# Patient Record
Sex: Male | Born: 1945 | Race: White | Hispanic: No | Marital: Married | Smoking: Light tobacco smoker
Health system: Southern US, Community
[De-identification: ages and names within clinical notes are randomized; demographics above are authoritative.]

## PROBLEM LIST (undated history)

## (undated) DIAGNOSIS — H9190 Unspecified hearing loss, unspecified ear: Secondary | ICD-10-CM

## (undated) DIAGNOSIS — K649 Unspecified hemorrhoids: Secondary | ICD-10-CM

## (undated) DIAGNOSIS — R972 Elevated prostate specific antigen [PSA]: Secondary | ICD-10-CM

## (undated) DIAGNOSIS — F528 Other sexual dysfunction not due to a substance or known physiological condition: Secondary | ICD-10-CM

## (undated) DIAGNOSIS — I2510 Atherosclerotic heart disease of native coronary artery without angina pectoris: Secondary | ICD-10-CM

## (undated) DIAGNOSIS — K5730 Diverticulosis of large intestine without perforation or abscess without bleeding: Secondary | ICD-10-CM

## (undated) DIAGNOSIS — IMO0002 Reserved for concepts with insufficient information to code with codable children: Secondary | ICD-10-CM

## (undated) DIAGNOSIS — J381 Polyp of vocal cord and larynx: Secondary | ICD-10-CM

## (undated) DIAGNOSIS — E785 Hyperlipidemia, unspecified: Secondary | ICD-10-CM

## (undated) DIAGNOSIS — K219 Gastro-esophageal reflux disease without esophagitis: Secondary | ICD-10-CM

## (undated) DIAGNOSIS — M1990 Unspecified osteoarthritis, unspecified site: Secondary | ICD-10-CM

## (undated) DIAGNOSIS — C61 Malignant neoplasm of prostate: Secondary | ICD-10-CM

## (undated) HISTORY — DX: Hyperlipidemia, unspecified: E78.5

## (undated) HISTORY — PX: OTHER SURGICAL HISTORY: SHX169

## (undated) HISTORY — DX: Malignant neoplasm of prostate: C61

## (undated) HISTORY — DX: Unspecified hearing loss, unspecified ear: H91.90

## (undated) HISTORY — DX: Unspecified osteoarthritis, unspecified site: M19.90

## (undated) HISTORY — DX: Elevated prostate specific antigen (PSA): R97.20

## (undated) HISTORY — DX: Diverticulosis of large intestine without perforation or abscess without bleeding: K57.30

## (undated) HISTORY — PX: TONSILLECTOMY: SUR1361

## (undated) HISTORY — DX: Other sexual dysfunction not due to a substance or known physiological condition: F52.8

## (undated) HISTORY — DX: Gastro-esophageal reflux disease without esophagitis: K21.9

## (undated) HISTORY — DX: Unspecified hemorrhoids: K64.9

## (undated) HISTORY — DX: Polyp of vocal cord and larynx: J38.1

## (undated) HISTORY — DX: Atherosclerotic heart disease of native coronary artery without angina pectoris: I25.10

## (undated) HISTORY — DX: Reserved for concepts with insufficient information to code with codable children: IMO0002

## (undated) HISTORY — PX: HEMORROIDECTOMY: SUR656

---

## 1999-05-20 HISTORY — PX: CARDIAC CATHETERIZATION: SHX172

## 1999-06-13 ENCOUNTER — Observation Stay (HOSPITAL_COMMUNITY): Admission: AD | Admit: 1999-06-13 | Discharge: 1999-06-14 | Payer: Self-pay | Admitting: *Deleted

## 1999-07-01 ENCOUNTER — Encounter (HOSPITAL_COMMUNITY): Admission: RE | Admit: 1999-07-01 | Discharge: 1999-09-29 | Payer: Self-pay | Admitting: Cardiology

## 1999-09-30 ENCOUNTER — Encounter (HOSPITAL_COMMUNITY): Admission: RE | Admit: 1999-09-30 | Discharge: 1999-10-19 | Payer: Self-pay | Admitting: Cardiology

## 1999-10-20 ENCOUNTER — Encounter: Admission: RE | Admit: 1999-10-20 | Discharge: 2000-01-18 | Payer: Self-pay | Admitting: Cardiology

## 1999-10-31 ENCOUNTER — Ambulatory Visit (HOSPITAL_COMMUNITY): Admission: RE | Admit: 1999-10-31 | Discharge: 1999-10-31 | Payer: Self-pay | Admitting: Gastroenterology

## 2000-10-07 ENCOUNTER — Emergency Department (HOSPITAL_COMMUNITY): Admission: EM | Admit: 2000-10-07 | Discharge: 2000-10-07 | Payer: Self-pay | Admitting: *Deleted

## 2002-09-05 ENCOUNTER — Emergency Department (HOSPITAL_COMMUNITY): Admission: EM | Admit: 2002-09-05 | Discharge: 2002-09-05 | Payer: Self-pay | Admitting: Emergency Medicine

## 2002-09-05 ENCOUNTER — Encounter: Payer: Self-pay | Admitting: Emergency Medicine

## 2003-05-21 ENCOUNTER — Encounter: Admission: RE | Admit: 2003-05-21 | Discharge: 2003-05-21 | Payer: Self-pay | Admitting: Otolaryngology

## 2003-05-21 ENCOUNTER — Encounter: Payer: Self-pay | Admitting: Otolaryngology

## 2003-05-22 ENCOUNTER — Ambulatory Visit (HOSPITAL_BASED_OUTPATIENT_CLINIC_OR_DEPARTMENT_OTHER): Admission: RE | Admit: 2003-05-22 | Discharge: 2003-05-22 | Payer: Self-pay | Admitting: Otolaryngology

## 2003-05-22 ENCOUNTER — Encounter (INDEPENDENT_AMBULATORY_CARE_PROVIDER_SITE_OTHER): Payer: Self-pay | Admitting: *Deleted

## 2004-08-25 ENCOUNTER — Ambulatory Visit: Payer: Self-pay | Admitting: Family Medicine

## 2004-10-15 ENCOUNTER — Ambulatory Visit: Payer: Self-pay | Admitting: Internal Medicine

## 2005-04-13 ENCOUNTER — Ambulatory Visit: Payer: Self-pay | Admitting: Cardiology

## 2005-04-13 ENCOUNTER — Ambulatory Visit: Payer: Self-pay

## 2005-04-17 ENCOUNTER — Ambulatory Visit: Payer: Self-pay | Admitting: Cardiology

## 2005-06-17 ENCOUNTER — Ambulatory Visit: Payer: Self-pay | Admitting: Cardiology

## 2005-09-09 ENCOUNTER — Ambulatory Visit: Payer: Self-pay | Admitting: Internal Medicine

## 2005-09-24 ENCOUNTER — Ambulatory Visit: Payer: Self-pay

## 2005-10-26 ENCOUNTER — Ambulatory Visit: Payer: Self-pay | Admitting: Internal Medicine

## 2006-04-19 ENCOUNTER — Ambulatory Visit: Payer: Self-pay | Admitting: Cardiology

## 2006-04-19 ENCOUNTER — Ambulatory Visit: Payer: Self-pay

## 2006-04-22 ENCOUNTER — Ambulatory Visit: Payer: Self-pay | Admitting: Cardiology

## 2006-05-13 ENCOUNTER — Ambulatory Visit: Payer: Self-pay

## 2006-08-04 ENCOUNTER — Ambulatory Visit: Payer: Self-pay | Admitting: Internal Medicine

## 2006-08-06 ENCOUNTER — Ambulatory Visit: Payer: Self-pay | Admitting: Internal Medicine

## 2006-08-06 LAB — CONVERTED CEMR LAB
ALT: 25 units/L (ref 0–40)
AST: 28 units/L (ref 0–37)
Albumin: 4 g/dL (ref 3.5–5.2)
Alkaline Phosphatase: 80 units/L (ref 39–117)
BUN: 18 mg/dL (ref 6–23)
Bilirubin, Direct: 0.2 mg/dL (ref 0.0–0.3)
CO2: 29 meq/L (ref 19–32)
Calcium: 9.3 mg/dL (ref 8.4–10.5)
Chloride: 108 meq/L (ref 96–112)
Creatinine, Ser: 1.1 mg/dL (ref 0.4–1.5)
GFR calc non Af Amer: 73 mL/min
Glomerular Filtration Rate, Af Am: 88 mL/min/{1.73_m2}
Glucose, Bld: 113 mg/dL — ABNORMAL HIGH (ref 70–99)
HCT: 44.2 % (ref 39.0–52.0)
Hemoglobin: 14.7 g/dL (ref 13.0–17.0)
Hgb A1c MFr Bld: 6.4 % — ABNORMAL HIGH (ref 4.6–6.0)
MCHC: 33.4 g/dL (ref 30.0–36.0)
MCV: 94.1 fL (ref 78.0–100.0)
PSA: 0.49 ng/mL (ref 0.10–4.00)
Platelets: 219 10*3/uL (ref 150–400)
Potassium: 4.4 meq/L (ref 3.5–5.1)
RBC: 4.69 M/uL (ref 4.22–5.81)
RDW: 12.6 % (ref 11.5–14.6)
Sodium: 143 meq/L (ref 135–145)
Total Bilirubin: 1.1 mg/dL (ref 0.3–1.2)
Total Protein: 6.7 g/dL (ref 6.0–8.3)
WBC: 5.8 10*3/uL (ref 4.5–10.5)

## 2006-08-26 ENCOUNTER — Ambulatory Visit: Payer: Self-pay | Admitting: Internal Medicine

## 2006-08-26 LAB — CONVERTED CEMR LAB
Chol/HDL Ratio, serum: 3
Cholesterol: 117 mg/dL (ref 0–200)
HDL: 39 mg/dL (ref >39.0)
LDL Cholesterol: 63 mg/dL (ref 0–99)
Triglyceride fasting, serum: 74 mg/dL (ref 0–149)
VLDL: 15 mg/dL (ref 0–40)

## 2006-10-21 ENCOUNTER — Encounter: Admission: RE | Admit: 2006-10-21 | Discharge: 2006-11-15 | Payer: Self-pay | Admitting: Internal Medicine

## 2006-12-30 ENCOUNTER — Ambulatory Visit: Payer: Self-pay | Admitting: Internal Medicine

## 2006-12-30 LAB — CONVERTED CEMR LAB
Cholesterol: 87 mg/dL (ref 0–200)
HDL: 34.7 mg/dL — ABNORMAL LOW (ref >39.0)
Hgb A1c MFr Bld: 6 % (ref 4.6–6.0)
LDL Cholesterol: 41 mg/dL (ref 0–99)
Total CHOL/HDL Ratio: 2.5
Triglycerides: 56 mg/dL (ref 0–149)
VLDL: 11 mg/dL (ref 0–40)

## 2007-03-31 ENCOUNTER — Ambulatory Visit: Payer: Self-pay

## 2007-03-31 ENCOUNTER — Encounter: Payer: Self-pay | Admitting: Internal Medicine

## 2007-03-31 LAB — CONVERTED CEMR LAB
ALT: 22 units/L (ref 0–40)
AST: 28 units/L (ref 0–37)
Albumin: 3.7 g/dL (ref 3.5–5.2)
Alkaline Phosphatase: 74 units/L (ref 39–117)
Bilirubin, Direct: 0.1 mg/dL (ref 0.0–0.3)
Cholesterol: 107 mg/dL (ref 0–200)
HDL: 33.9 mg/dL — ABNORMAL LOW (ref >39.0)
Hgb A1c MFr Bld: 6.1 % — ABNORMAL HIGH (ref 4.6–6.0)
LDL Cholesterol: 54 mg/dL (ref 0–99)
Total Bilirubin: 1.1 mg/dL (ref 0.3–1.2)
Total CHOL/HDL Ratio: 3.2
Total Protein: 6.5 g/dL (ref 6.0–8.3)
Triglycerides: 97 mg/dL (ref 0–149)
VLDL: 19 mg/dL (ref 0–40)

## 2007-04-12 ENCOUNTER — Ambulatory Visit: Payer: Self-pay | Admitting: Internal Medicine

## 2007-04-12 DIAGNOSIS — I2510 Atherosclerotic heart disease of native coronary artery without angina pectoris: Secondary | ICD-10-CM

## 2007-04-12 DIAGNOSIS — E119 Type 2 diabetes mellitus without complications: Secondary | ICD-10-CM

## 2007-04-12 DIAGNOSIS — E785 Hyperlipidemia, unspecified: Secondary | ICD-10-CM | POA: Insufficient documentation

## 2007-04-15 ENCOUNTER — Ambulatory Visit: Payer: Self-pay | Admitting: Cardiology

## 2007-04-15 LAB — CONVERTED CEMR LAB
BUN: 17 mg/dL (ref 6–23)
CO2: 32 meq/L (ref 19–32)
Calcium: 9.4 mg/dL (ref 8.4–10.5)
Chloride: 109 meq/L (ref 96–112)
Creatinine, Ser: 1.1 mg/dL (ref 0.4–1.5)
GFR calc Af Amer: 88 mL/min
GFR calc non Af Amer: 73 mL/min
Glucose, Bld: 91 mg/dL (ref 70–99)
Potassium: 4.3 meq/L (ref 3.5–5.1)
Sodium: 143 meq/L (ref 135–145)

## 2007-04-26 ENCOUNTER — Encounter: Payer: Self-pay | Admitting: Internal Medicine

## 2007-08-06 ENCOUNTER — Encounter: Payer: Self-pay | Admitting: Internal Medicine

## 2007-08-26 ENCOUNTER — Ambulatory Visit: Payer: Self-pay | Admitting: Internal Medicine

## 2007-08-26 DIAGNOSIS — K5730 Diverticulosis of large intestine without perforation or abscess without bleeding: Secondary | ICD-10-CM | POA: Insufficient documentation

## 2007-08-26 DIAGNOSIS — Z87898 Personal history of other specified conditions: Secondary | ICD-10-CM | POA: Insufficient documentation

## 2007-08-29 LAB — CONVERTED CEMR LAB
Bilirubin, Direct: 0.1 mg/dL (ref 0.0–0.3)
Creatinine,U: 173.6 mg/dL
Eosinophils Absolute: 0.1 10*3/uL (ref 0.0–0.6)
Eosinophils Relative: 2.4 % (ref 0.0–5.0)
HCT: 43.1 % (ref 39.0–52.0)
Hemoglobin: 14.8 g/dL (ref 13.0–17.0)
Lymphocytes Relative: 32.6 % (ref 12.0–46.0)
MCV: 94.1 fL (ref 78.0–100.0)
Microalb Creat Ratio: 4.6 mg/g (ref 0.0–30.0)
Microalb, Ur: 0.8 mg/dL (ref 0.0–1.9)
Monocytes Absolute: 0.5 10*3/uL (ref 0.2–0.7)
Neutro Abs: 3.4 10*3/uL (ref 1.4–7.7)
Neutrophils Relative %: 55.2 % (ref 43.0–77.0)
Platelets: 212 10*3/uL (ref 150–400)
Total Protein: 7 g/dL (ref 6.0–8.3)
WBC: 6.1 10*3/uL (ref 4.5–10.5)

## 2008-02-27 ENCOUNTER — Telehealth (INDEPENDENT_AMBULATORY_CARE_PROVIDER_SITE_OTHER): Payer: Self-pay | Admitting: *Deleted

## 2008-03-20 ENCOUNTER — Ambulatory Visit: Payer: Self-pay | Admitting: Internal Medicine

## 2008-03-20 DIAGNOSIS — F528 Other sexual dysfunction not due to a substance or known physiological condition: Secondary | ICD-10-CM

## 2008-03-23 LAB — CONVERTED CEMR LAB
BUN: 18 mg/dL (ref 6–23)
Calcium: 8.8 mg/dL (ref 8.4–10.5)
GFR calc Af Amer: 110 mL/min
Glucose, Bld: 88 mg/dL (ref 70–99)

## 2008-04-17 ENCOUNTER — Ambulatory Visit: Payer: Self-pay | Admitting: Cardiology

## 2008-04-17 LAB — CONVERTED CEMR LAB
AST: 30 units/L (ref 0–37)
Alkaline Phosphatase: 72 units/L (ref 39–117)
Bilirubin, Direct: 0.1 mg/dL (ref 0.0–0.3)
Chloride: 103 meq/L (ref 96–112)
Cholesterol: 143 mg/dL (ref 0–200)
GFR calc Af Amer: 98 mL/min
GFR calc non Af Amer: 81 mL/min
Glucose, Bld: 98 mg/dL (ref 70–99)
HDL: 34.8 mg/dL — ABNORMAL LOW (ref >39.0)
LDL Cholesterol: 76 mg/dL (ref 0–99)
Potassium: 4.3 meq/L (ref 3.5–5.1)
Sodium: 140 meq/L (ref 135–145)
Total CHOL/HDL Ratio: 4.1
Total Protein: 6.8 g/dL (ref 6.0–8.3)
Triglycerides: 162 mg/dL — ABNORMAL HIGH (ref 0–149)

## 2008-05-09 ENCOUNTER — Telehealth: Payer: Self-pay | Admitting: Internal Medicine

## 2008-05-30 ENCOUNTER — Ambulatory Visit: Payer: Self-pay | Admitting: Internal Medicine

## 2008-06-04 ENCOUNTER — Telehealth (INDEPENDENT_AMBULATORY_CARE_PROVIDER_SITE_OTHER): Payer: Self-pay | Admitting: *Deleted

## 2008-06-29 ENCOUNTER — Telehealth: Payer: Self-pay | Admitting: Internal Medicine

## 2008-06-29 ENCOUNTER — Ambulatory Visit: Payer: Self-pay | Admitting: Internal Medicine

## 2008-08-22 ENCOUNTER — Ambulatory Visit: Payer: Self-pay | Admitting: Internal Medicine

## 2008-08-24 LAB — CONVERTED CEMR LAB
BUN: 18 mg/dL (ref 6–23)
CO2: 30 meq/L (ref 19–32)
Chloride: 108 meq/L (ref 96–112)
Eosinophils Relative: 2.9 % (ref 0.0–5.0)
Glucose, Bld: 94 mg/dL (ref 70–99)
HDL: 32.6 mg/dL — ABNORMAL LOW (ref >39.0)
Hgb A1c MFr Bld: 6.4 % — ABNORMAL HIGH (ref 4.6–6.0)
Microalb Creat Ratio: 1.7 mg/g (ref 0.0–30.0)
Monocytes Relative: 9.2 % (ref 3.0–12.0)
Neutrophils Relative %: 61.9 % (ref 43.0–77.0)
Platelets: 182 10*3/uL (ref 150–400)
Potassium: 4.2 meq/L (ref 3.5–5.1)
TSH: 1.84 microintl units/mL (ref 0.35–5.50)
Total CHOL/HDL Ratio: 3.8
VLDL: 23 mg/dL (ref 0–40)
WBC: 6.5 10*3/uL (ref 4.5–10.5)

## 2008-08-28 ENCOUNTER — Ambulatory Visit: Payer: Self-pay | Admitting: Internal Medicine

## 2008-09-05 ENCOUNTER — Ambulatory Visit: Payer: Self-pay | Admitting: Internal Medicine

## 2008-09-05 LAB — CONVERTED CEMR LAB: OCCULT 1: NEGATIVE

## 2008-09-06 ENCOUNTER — Encounter (INDEPENDENT_AMBULATORY_CARE_PROVIDER_SITE_OTHER): Payer: Self-pay | Admitting: *Deleted

## 2008-10-19 HISTORY — PX: MOUTH SURGERY: SHX715

## 2008-11-29 ENCOUNTER — Ambulatory Visit: Payer: Self-pay | Admitting: Internal Medicine

## 2008-12-03 ENCOUNTER — Telehealth (INDEPENDENT_AMBULATORY_CARE_PROVIDER_SITE_OTHER): Payer: Self-pay | Admitting: *Deleted

## 2008-12-03 LAB — CONVERTED CEMR LAB
ALT: 26 units/L (ref 0–53)
AST: 33 units/L (ref 0–37)
LDL Cholesterol: 68 mg/dL (ref 0–99)
Total CHOL/HDL Ratio: 3.9
Triglycerides: 142 mg/dL (ref 0–149)

## 2009-03-04 ENCOUNTER — Encounter (INDEPENDENT_AMBULATORY_CARE_PROVIDER_SITE_OTHER): Payer: Self-pay | Admitting: *Deleted

## 2009-04-08 ENCOUNTER — Ambulatory Visit: Payer: Self-pay | Admitting: Internal Medicine

## 2009-04-11 ENCOUNTER — Telehealth (INDEPENDENT_AMBULATORY_CARE_PROVIDER_SITE_OTHER): Payer: Self-pay | Admitting: *Deleted

## 2009-04-11 LAB — CONVERTED CEMR LAB
Hgb A1c MFr Bld: 6 % (ref 4.6–6.5)
VLDL: 11.4 mg/dL (ref 0.0–40.0)

## 2009-05-01 ENCOUNTER — Ambulatory Visit: Payer: Self-pay | Admitting: Cardiology

## 2009-05-01 ENCOUNTER — Encounter: Payer: Self-pay | Admitting: Cardiology

## 2009-07-18 ENCOUNTER — Encounter (INDEPENDENT_AMBULATORY_CARE_PROVIDER_SITE_OTHER): Payer: Self-pay | Admitting: *Deleted

## 2009-08-07 ENCOUNTER — Ambulatory Visit: Payer: Self-pay | Admitting: Internal Medicine

## 2009-08-12 LAB — CONVERTED CEMR LAB
ALT: 18 units/L (ref 0–53)
BUN: 14 mg/dL (ref 6–23)
Calcium: 9.6 mg/dL (ref 8.4–10.5)
Creatinine, Ser: 1 mg/dL (ref 0.4–1.5)
GFR calc non Af Amer: 80.32 mL/min (ref >60)
Hgb A1c MFr Bld: 5.8 % (ref 4.6–6.5)
Microalb Creat Ratio: 3.5 mg/g (ref 0.0–30.0)
Potassium: 4.4 meq/L (ref 3.5–5.1)

## 2009-11-07 ENCOUNTER — Ambulatory Visit: Payer: Self-pay | Admitting: Internal Medicine

## 2009-11-07 DIAGNOSIS — K219 Gastro-esophageal reflux disease without esophagitis: Secondary | ICD-10-CM

## 2009-11-10 LAB — CONVERTED CEMR LAB
Basophils Absolute: 0.1 10*3/uL (ref 0.0–0.1)
Eosinophils Absolute: 0.2 10*3/uL (ref 0.0–0.7)
HCT: 45.8 % (ref 39.0–52.0)
Hemoglobin: 15.2 g/dL (ref 13.0–17.0)
Lymphs Abs: 2 10*3/uL (ref 0.7–4.0)
MCHC: 33.1 g/dL (ref 30.0–36.0)
MCV: 96.6 fL (ref 78.0–100.0)
Monocytes Absolute: 0.5 10*3/uL (ref 0.1–1.0)
Neutro Abs: 3 10*3/uL (ref 1.4–7.7)
PSA: 1.09 ng/mL (ref 0.10–4.00)
RDW: 12.9 % (ref 11.5–14.6)
Total CHOL/HDL Ratio: 3

## 2009-11-29 ENCOUNTER — Encounter (INDEPENDENT_AMBULATORY_CARE_PROVIDER_SITE_OTHER): Payer: Self-pay | Admitting: *Deleted

## 2009-12-02 ENCOUNTER — Encounter (INDEPENDENT_AMBULATORY_CARE_PROVIDER_SITE_OTHER): Payer: Self-pay

## 2009-12-03 ENCOUNTER — Ambulatory Visit: Payer: Self-pay | Admitting: Gastroenterology

## 2009-12-13 ENCOUNTER — Ambulatory Visit: Payer: Self-pay | Admitting: Gastroenterology

## 2009-12-16 ENCOUNTER — Telehealth (INDEPENDENT_AMBULATORY_CARE_PROVIDER_SITE_OTHER): Payer: Self-pay | Admitting: *Deleted

## 2009-12-17 ENCOUNTER — Encounter: Payer: Self-pay | Admitting: Gastroenterology

## 2010-01-09 ENCOUNTER — Telehealth: Payer: Self-pay | Admitting: Cardiology

## 2010-01-09 ENCOUNTER — Telehealth (INDEPENDENT_AMBULATORY_CARE_PROVIDER_SITE_OTHER): Payer: Self-pay | Admitting: *Deleted

## 2010-02-12 ENCOUNTER — Ambulatory Visit: Payer: Self-pay | Admitting: Internal Medicine

## 2010-02-18 LAB — CONVERTED CEMR LAB
ALT: 22 units/L (ref 0–53)
AST: 30 units/L (ref 0–37)
CO2: 33 meq/L — ABNORMAL HIGH (ref 19–32)
Chloride: 102 meq/L (ref 96–112)
Hgb A1c MFr Bld: 5.8 % (ref 4.6–6.5)
Sodium: 142 meq/L (ref 135–145)

## 2010-05-21 ENCOUNTER — Encounter: Payer: Self-pay | Admitting: Cardiology

## 2010-05-21 ENCOUNTER — Ambulatory Visit: Payer: Self-pay | Admitting: Cardiology

## 2010-06-03 ENCOUNTER — Telehealth (INDEPENDENT_AMBULATORY_CARE_PROVIDER_SITE_OTHER): Payer: Self-pay | Admitting: *Deleted

## 2010-06-04 ENCOUNTER — Ambulatory Visit: Payer: Self-pay

## 2010-06-04 ENCOUNTER — Ambulatory Visit: Payer: Self-pay | Admitting: Cardiovascular Disease

## 2010-06-04 ENCOUNTER — Ambulatory Visit: Payer: Self-pay | Admitting: Cardiology

## 2010-06-04 ENCOUNTER — Encounter: Payer: Self-pay | Admitting: Cardiovascular Disease

## 2010-06-04 ENCOUNTER — Encounter (HOSPITAL_COMMUNITY): Admission: RE | Admit: 2010-06-04 | Discharge: 2010-07-15 | Payer: Self-pay | Admitting: Cardiology

## 2010-06-06 ENCOUNTER — Encounter: Payer: Self-pay | Admitting: Cardiovascular Disease

## 2010-06-06 LAB — CONVERTED CEMR LAB
ALT: 20 units/L (ref 0–53)
Albumin: 3.9 g/dL (ref 3.5–5.2)
Alkaline Phosphatase: 60 units/L (ref 39–117)
Bilirubin, Direct: 0.1 mg/dL (ref 0.0–0.3)
Chloride: 104 meq/L (ref 96–112)
Cholesterol: 117 mg/dL (ref 0–200)
Creatinine, Ser: 0.9 mg/dL (ref 0.4–1.5)
GFR calc non Af Amer: 96.63 mL/min (ref >60)
LDL Cholesterol: 61 mg/dL (ref 0–99)
Potassium: 4.2 meq/L (ref 3.5–5.1)
Total Protein: 6.3 g/dL (ref 6.0–8.3)
Triglycerides: 71 mg/dL (ref 0.0–149.0)
VLDL: 14.2 mg/dL (ref 0.0–40.0)

## 2010-06-16 ENCOUNTER — Telehealth (INDEPENDENT_AMBULATORY_CARE_PROVIDER_SITE_OTHER): Payer: Self-pay | Admitting: *Deleted

## 2010-08-01 ENCOUNTER — Ambulatory Visit: Payer: Self-pay | Admitting: Internal Medicine

## 2010-11-10 ENCOUNTER — Other Ambulatory Visit: Payer: Self-pay | Admitting: Internal Medicine

## 2010-11-10 ENCOUNTER — Ambulatory Visit
Admission: RE | Admit: 2010-11-10 | Discharge: 2010-11-10 | Payer: Self-pay | Source: Home / Self Care | Attending: Internal Medicine | Admitting: Internal Medicine

## 2010-11-10 LAB — CBC WITH DIFFERENTIAL/PLATELET
Basophils Absolute: 0 10*3/uL (ref 0.0–0.1)
Basophils Relative: 0.8 % (ref 0.0–3.0)
Eosinophils Absolute: 0.3 10*3/uL (ref 0.0–0.7)
Eosinophils Relative: 6.1 % — ABNORMAL HIGH (ref 0.0–5.0)
HCT: 43.8 % (ref 39.0–52.0)
Hemoglobin: 15.1 g/dL (ref 13.0–17.0)
Lymphocytes Relative: 39 % (ref 12.0–46.0)
Lymphs Abs: 2 10*3/uL (ref 0.7–4.0)
MCHC: 34.5 g/dL (ref 30.0–36.0)
MCV: 95.2 fl (ref 78.0–100.0)
Monocytes Absolute: 0.5 10*3/uL (ref 0.1–1.0)
Monocytes Relative: 9 % (ref 3.0–12.0)
Neutro Abs: 2.3 10*3/uL (ref 1.4–7.7)
Neutrophils Relative %: 45.1 % (ref 43.0–77.0)
Platelets: 204 10*3/uL (ref 150.0–400.0)
RBC: 4.6 Mil/uL (ref 4.22–5.81)
RDW: 13.1 % (ref 11.5–14.6)
WBC: 5.1 10*3/uL (ref 4.5–10.5)

## 2010-11-10 LAB — PSA: PSA: 1.12 ng/mL (ref 0.10–4.00)

## 2010-11-10 LAB — TSH: TSH: 2.26 u[IU]/mL (ref 0.35–5.50)

## 2010-11-10 LAB — HEMOGLOBIN A1C: Hgb A1c MFr Bld: 6.2 % (ref 4.6–6.5)

## 2010-11-20 NOTE — Assessment & Plan Note (Signed)
Summary: Cardiology Nuclear Testing  Nuclear Med Background Indications for Stress Test: Evaluation for Ischemia, Stent Patency, PTCA Patency, Post Hospital  Indications Comments: Primary Dr.Robert Read 05/15/10 Dc'd: CP/SOB (-) enzymes Dx: Bil Pneumonia   History Comments: 98 MI/stent RCA  08/05 Cath 20% in stent RCA EF normal 7/09 Echo EF normal 06/10 MPS EF 51% Hx Afib  Symptoms: Chest Pain  Symptoms Comments: Heartburn recent admission Nonproductive cough   Nuclear Pre-Procedure Cardiac Risk Factors: Family History - CAD, History of Smoking, Hypertension, Lipids, Obesity Caffeine/Decaff Intake: None NPO After: 7:00 AM Lungs: clear IV 0.9% NS with Angio Cath: 22g     IV Site: Rt Hand IV Started by: Bonnita Levan RN Chest Size (in) 44     Height (in): 68 Weight (lb): 224 BMI: 34.18  Nuclear Med Study 1 or 2 day study:  1 day     Stress Test Type:  Stress Reading MD:  Charlton Haws, MD     Referring MD:  B.Crenshaw Resting Radionuclide:  Technetium 64m Tetrofosmin     Resting Radionuclide Dose:  11.0 mCi  Stress Radionuclide:  Technetium 31m Tetrofosmin     Stress Radionuclide Dose:  33.0 mCi   Stress Protocol Exercise Time (min):  8:30 min     Max HR:  141 bpm     Predicted Max HR:  157 bpm  Max Systolic BP: 148 mm Hg     Percent Max HR:  89.81 %     METS: 10.10 Rate Pressure Product:  54098    Stress Test Technologist:  Milana Na EMT-P     Nuclear Technologist:  Domenic Polite CNMT  Rest Procedure  Myocardial perfusion imaging was performed at rest 45 minutes following the intravenous administration of Myoview Technetium 64m Tetrofosmin.  Stress Procedure  The patient exercised for 8:30. The patient stopped due to fatigue and denied any chest pain.  There were no significant ST-T wave changes.  Myoview was injected at peak exercise and myocardial perfusion imaging was performed after a brief delay.  QPS Raw Data Images:  Normal; no motion artifact; normal  heart/lung ratio. Stress Images:  NI: Uniform and normal uptake of tracer in all myocardial segments. Rest Images:  Normal homogeneous uptake in all areas of the myocardium. Subtraction (SDS):  Normal Transient Ischemic Dilatation:  .86  (Normal <1.22)  Lung/Heart Ratio:  .31  (Normal <0.45)  Quantitative Gated Spect Images QGS EDV:  79 ml QGS ESV:  27 ml QGS EF:  66 % QGS cine images:  normal  Findings Normal nuclear study      Overall Impression  Exercise Capacity: Fair exercise capacity. BP Response: Normal blood pressure response. Clinical Symptoms: No chest pain ECG Impression: No significant ST segment change suggestive of ischemia. Overall Impression: Normal stress nuclear study.  Appended Document: Cardiology Nuclear Testing pt aware of results

## 2010-11-20 NOTE — Letter (Signed)
Summary: Results Letter  [facility]  [address] [place], [place] [address]   Phone: [phone]  Fax: [phone]        December 17, 2009 MRN: [id number]    [facility] [address] [place], [place]  [address]    Dear Harry Baker,   Your biopsies demonstrated inflammatory changes only.    I recommend that you repeat your colonoscopy in 10 years.  Should you have any immediate concerns or questions, feel free to contact me at the office.    Sincerely,  Barbette Hair. Arlyce Dice, M.D., [facility]          Sincerely,  Louis Meckel MD  This letter has been electronically signed by your physician.  Appended Document: Results Letter letter mailed 3.3.11

## 2010-11-20 NOTE — Assessment & Plan Note (Signed)
Summary: CPX/NS/KDC   Vital Signs:  Patient profile:   65 year old male Height:      68 inches Weight:      216.8 pounds Pulse rate:   76 / minute BP sitting:   121 / 70  Vitals Entered By: Shary Decamp (November 07, 2009 10:20 AM) CC: cpx/fasting Comments pt wants to discuss lansoprazole   History of Present Illness: CPX  had oral surgery a week ago   Allergies: No Known Drug Allergies  Past History:  Past Medical History: Coronary artery disease, s/p stent, 6-08 (-) myoview Diabetes mellitus, type II Hyperlipidemia Diverticulosis, colon hemorrhoid problems on off h/o VOCAL CORD POLYP herniated disk- sees chiropractor-2010  Past Surgical History: Tonsillectomy VC cyst removal oral surgery-2010   Conclusions Succesful percutaneous transluminal coronary angiography with stent placement in the proximal portion of the distal right cornary atrey, reducing the 95% stenosis to 0% residual. Succesful percutaneous transluminal coronary angiography of the distal portion of the distal right coronary artery reducing a 75% stenosis to 25% Succesful percutaneous transluminal conary angiography of the posterior descending artery reducing a 75% stenosis to 40% residual. There is TIMI-3 flow in bopth the posterior descending artery and the distal rigt coronary artery Dr. 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 diet-- healthy , count calories exercise-- stays active   Review of Systems CV:  Denies chest pain or discomfort and swelling of feet. Resp:  Denies cough and shortness of breath. GI:  Denies bloody stools, diarrhea, nausea, and vomiting. GU:  Denies hematuria and urinary hesitancy; rarely has urgency.  Physical Exam  General:  alert  and well-developed.   Neck:  no masses, no thyromegaly, and normal carotid upstroke.   Lungs:  normal respiratory effort, no intercostal retractions, no accessory muscle use, and normal breath sounds.   Heart:  normal rate, regular rhythm, and no murmur.   Abdomen:  soft, non-tender, no distention, no masses, no guarding, and no rigidity.   Rectal:  No external abnormalities noted. Normal sphincter tone. No rectal masses or tenderness. no stools found Prostate:  Prostate gland firm and smooth, no enlargement, nodularity, tenderness, mass, asymmetry or induration. Psych:  Cognition and judgment appear intact. Alert and cooperative with normal attention span and concentration  not anxious appearing and not depressed appearing.     Impression & Recommendations:  Problem # 1:  HEALTH SCREENING (ICD-V70.0)  Last Tetanus Booster:  Historical (07/25/2002) Last Pneumovax:  Pneumovax (08/26/2007) shingle shots 11-09 had a flu shot  colonoscopy 2000, due for another  colonoscopy, planning to do that soon continue with healthy lifestyle!    Orders: Venipuncture (14782) TLB-CBC Platelet - w/Differential (85025-CBCD) TLB-PSA (Prostate Specific Antigen) (84153-PSA)  Problem # 2:  GERD (ICD-530.81) dx a while back base on cough, essentially no heartburn he runned out of PPIs and is asx, wonders if needs to continue w/ PPIs chronically plan:  to take PPIs as needed   His updated medication list for this problem includes:    Lansoprazole 30 Mg Cpdr (Lansoprazole) .Marland Kitchen... 1 by mouth once daily  Complete Medication List: 1)  Lipitor 40 Mg Tabs (Atorvastatin calcium) .Marland Kitchen.. 1 by mouth once daily 2)  Bayer Aspirin 325 Mg Tabs (Aspirin) .... Take 3)  Zetia 10 Mg Tabs (Ezetimibe) .Marland Kitchen.. 1 tablet by mouth once a  day 4)  Lansoprazole 30 Mg Cpdr (Lansoprazole) .Marland Kitchen.. 1 by mouth once daily 5)  Altace 5 Mg Caps (Ramipril) .Marland Kitchen.. 1 once daily 6)  Fish Oil Oil (Fish oil) .... 1000mg  4 by mouth once daily 7)   Medisense Thin Lancets Misc (Lancets) .... As directed 8)  Precision Xtra Blood Glucose Strp (Glucose blood) .... As directed 9)  Viagra 100 Mg Tabs (Sildenafil citrate) .... 1/2 or 1 by mouth once daily as needed 10)  Niaspan 1000 Mg Cr-tabs (Niacin (antihyperlipidemic)) .Marland Kitchen.. 1 a day 11)  Multivitamins Tabs (Multiple vitamin) .... Take 1 tablet by mouth once a day 12)  Coq10 30 Mg Caps (Coenzyme q10) .... Take 1 capsule by mouth once a day 13)  Vitamin C 500 Mg Tabs (Ascorbic acid) .... Take 1 tablet by mouth once a day 14)  Vitamin E 600 Unit Caps (Vitamin e) .... Take 1 capsule by mouth once a day 15)  Nitroglycerin 0.4 Mg Subl (Nitroglycerin) .... One tablet under tongue every 5 minutes as needed for chest pain---may repeat times three  Other Orders: TLB-A1C / Hgb A1C (Glycohemoglobin) (83036-A1C) TLB-Lipid Panel (80061-LIPID)  Patient Instructions: 1)  Please schedule a follow-up appointment in 4 months .    Preventive Care Screening  Prior Values:    PSA:  0.68 (08/22/2008)    Last Tetanus Booster:  Historical (07/25/2002)    Last Flu Shot:  Historical (07/20/2007)    Last Pneumovax:  Pneumovax (08/26/2007)

## 2010-11-20 NOTE — Assessment & Plan Note (Signed)
Summary: CPX AND FASTING LABS///SPH   Vital Signs:  Patient profile:   65 year old male Height:      68 inches (172.72 cm) Weight:      238.50 pounds (108.41 kg) BMI:     36.39 Temp:     97.9 degrees F (36.61 degrees C) oral BP sitting:   120 / 64  (left arm)  Vitals Entered By: Lucious Groves CMA (November 10, 2010 8:02 AM)  History of Present Illness: Here for Medicare AWV:  1.   Risk factors based on Past M, S, F history: reviewed  2.   Physical Activities: active w/ house work, Presenter, broadcasting, has a farm 3.   Depression/mood: no problems reported or noted  4.   Hearing: uses hearing aids, recently rechecked  5.   ADL's: totally independent 6.   Fall Risk: low rosk no recent falls 7.   Home Safety: does feel safe at home  8.   Height, weight, &visual acuity: see VS, good vision w/ glasses  9.   Counseling:  10.   Labs ordered based on risk factors: yes 11.           Referral Coordination,if needed 12.           Care Plan, see assessment and plan 13.            Cognitive Assessment-- cognition, motor skills and memory normal  in addition, we discussed the following issues Coronary artery disease, , asx  had a negative stress test recently  Diabetes-- diet only, no recent CBGs Hyperlipidemia-- good medication compliance , no myalgias  h/o  herniated disk-  no further pain   Current Medications (verified): 1)  Lipitor 40 Mg Tabs (Atorvastatin Calcium) .Marland Kitchen.. 1 By Mouth Once Daily 2)  Bayer Aspirin 325 Mg Tabs (Aspirin) .... Take 3)  Zetia 10 Mg Tabs (Ezetimibe) .Marland Kitchen.. 1 Tablet By Mouth Once A Day 4)  Prilosec Otc 20 Mg Tbec (Omeprazole Magnesium) .... One Tablet By Mouth Once Daily 5)  Altace 5 Mg  Caps (Ramipril) .Marland Kitchen.. 1 Once Daily 6)  Fish Oil   Oil (Fish Oil) .... 1000mg  4 By Mouth Once Daily 7)  Medisense Thin Lancets   Misc (Lancets) .... As Directed 8)  Precision Xtra Blood Glucose   Strp (Glucose Blood) .... As Directed 9)  Niaspan 1000 Mg Cr-Tabs (Niacin (Antihyperlipidemic))  .Marland Kitchen.. 1 A Day 10)  Multivitamins   Tabs (Multiple Vitamin) .... Take 1 Tablet By Mouth Once A Day 11)  Coq10 30 Mg Caps (Coenzyme Q10) .... Take 1 Capsule By Mouth Once A Day 12)  Vitamin C 500 Mg  Tabs (Ascorbic Acid) .... Take 1 Tablet By Mouth Once A Day 13)  Vitamin E 600 Unit  Caps (Vitamin E) .... Take 1 Capsule By Mouth Once A Day 14)  Nitroglycerin 0.4 Mg Subl (Nitroglycerin) .... One Tablet Under Tongue Every 5 Minutes As Needed For Chest Pain---May Repeat Times Three  Allergies (verified): No Known Drug Allergies  Past History:  Past Medical History: Reviewed history from 08/01/2010 and no changes required. Coronary artery disease, s/p stent, 6-08 (-) myoview, 8-11 negative stress test Diabetes mellitus, type II Hyperlipidemia Diverticulosis, colon hemorrhoid problems on off h/o VOCAL CORD POLYP herniated disk- sees chiropractor-2010  Past Surgical History: Reviewed history from 11/07/2009 and no changes required. Tonsillectomy VC cyst removal oral surgery-2010   Conclusions Succesful percutaneous transluminal coronary angiography with stent placement in the proximal portion of the distal right cornary atrey, reducing  the 95% stenosis to 0% residual. Succesful percutaneous transluminal coronary angiography of the distal portion of the distal right coronary artery reducing a 75% stenosis to 25% Succesful percutaneous transluminal conary angiography of the posterior descending artery reducing a 75% stenosis to 40% residual. There is TIMI-3 flow in bopth the posterior descending artery and the distal rigt coronary artery Dr. 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 diet--  needs to go bacj to a healthier diet, and  calorie count  exercise-- stays active   Review of Systems CV:  Denies chest pain or discomfort, palpitations, and swelling of feet. Resp:  Denies cough and shortness of breath. GI:  Denies abdominal pain, diarrhea, nausea, and vomiting; blood per rectum x 1  2 months ago  (after the cscope which was  ~ 1 year ago). GU:  Denies hematuria, urinary frequency, and urinary hesitancy.  Physical Exam  General:  alert, well-developed, and overweight-appearing.   Ears:  R ear normal and L ear normal.   Neck:  no masses and no thyromegaly.   Lungs:  normal respiratory effort, no intercostal retractions, no accessory muscle use, and normal breath sounds.   Heart:  normal rate, regular rhythm, and no murmur.   Abdomen:  soft, non-tender, no distention, no masses, no guarding, and no rigidity.   Rectal:  few external skin tags noted. Normal sphincter tone. No rectal masses or tenderness. no stools found Prostate:  Prostate gland firm and smooth, no enlargement, nodularity, tenderness, mass, asymmetry or induration. Extremities:  no edema Psych:  Oriented X3, memory intact for recent and remote, normally interactive, good eye contact, not anxious appearing, and not depressed appearing.     Impression & Recommendations:  Problem # 1:  HEALTH SCREENING (ICD-V70.0)  Last Tetanus Booster:  Historical (07/25/2002) Last Pneumovax:  Pneumovax (08/26/2007) shingle shots 11-09 had a flu shot   colonoscopy 2000 , 11-2009 ----> next 2021  diet exercise discussed Check PSA  Orders: Medicare -1st Annual Wellness Visit [phone])  Problem # 2:  HYPERLIPIDEMIA (ICD-272.4) well-controlled  His updated medication list for this problem includes:    Lipitor 40 Mg Tabs (Atorvastatin calcium) .Marland Kitchen... 1 by mouth once daily    Zetia 10 Mg Tabs (Ezetimibe) .Marland Kitchen... 1 tablet by mouth once a day    Niaspan 1000 Mg Cr-tabs (Niacin (antihyperlipidemic)) .Marland Kitchen... 1 a day  Labs Reviewed: SGOT: 25 (06/04/2010)   SGPT: 20  (06/04/2010)   HDL:41.80 (06/04/2010), 46.60 (11/07/2009)  LDL:61 (06/04/2010), 52 (11/07/2009)  Chol:117 (06/04/2010), 118 (11/07/2009)  Trig:71.0 (06/04/2010), 99.0 (11/07/2009)  Orders: TLB-TSH (Thyroid Stimulating Hormone) (84443-TSH)  Problem # 3:  DIABETES MELLITUS, TYPE II (ICD-250.00)  due labs  His updated medication list for this problem includes:    Bayer Aspirin 325 Mg Tabs (Aspirin) .Marland Kitchen... Take    Altace 5 Mg Caps (Ramipril) .Marland Kitchen... 1 once daily    Labs Reviewed: Creat: 0.9 (06/04/2010)    Reviewed HgBA1c results: 6.4 (08/11/2010)  5.8 (02/12/2010)  Orders: TLB-A1C / Hgb A1C (Glycohemoglobin) (83036-A1C) Specimen Handling (98119)  Problem # 4:  CORONARY ARTERY DISEASE (ICD-414.00) asx, doing well His updated medication list for this problem includes:    Bayer Aspirin 325 Mg Tabs (Aspirin) .Marland Kitchen... Take    Altace 5 Mg Caps (Ramipril) .Marland Kitchen... 1 once daily  Nitroglycerin 0.4 Mg Subl (Nitroglycerin) ..... One tablet under tongue every 5 minutes as needed for chest pain---may repeat times three  Orders: TLB-CBC Platelet - w/Differential (85025-CBCD) Specimen Handling ([address])  Complete Medication List: 1)  Lipitor 40 Mg Tabs (Atorvastatin calcium) .Marland Kitchen.. 1 by mouth once daily 2)  Bayer Aspirin 325 Mg Tabs (Aspirin) .... Take 3)  Zetia 10 Mg Tabs (Ezetimibe) .Marland Kitchen.. 1 tablet by mouth once a day 4)  Prilosec Otc 20 Mg Tbec (Omeprazole magnesium) .... One tablet by mouth once daily 5)  Altace 5 Mg Caps (Ramipril) .Marland Kitchen.. 1 once daily 6)  Fish Oil Oil (Fish oil) .... 1000mg  4 by mouth once daily 7)  Medisense Thin Lancets Misc (Lancets) .... As directed 8)  Precision Xtra Blood Glucose Strp (Glucose blood) .... As directed 9)  Niaspan 1000 Mg Cr-tabs (Niacin (antihyperlipidemic)) .Marland Kitchen.. 1 a day 10)  Multivitamins Tabs (Multiple vitamin) .... Take 1 tablet by mouth once a day 11)  Coq10 30 Mg Caps (Coenzyme q10) .... Take 1 capsule by mouth once a day 12)  Vitamin C 500 Mg Tabs  (Ascorbic acid) .... Take 1 tablet by mouth once a day 13)  Vitamin E 600 Unit Caps (Vitamin e) .... Take 1 capsule by mouth once a day 14)  Nitroglycerin 0.4 Mg Subl (Nitroglycerin) .... One tablet under tongue every 5 minutes as needed for chest pain---may repeat times three  Other Orders: Venipuncture (60454) TLB-PSA (Prostate Specific Antigen) (84153-PSA)  Patient Instructions: 1)  Please schedule a follow-up appointment in 4 months .     Orders Added: 1)  Venipuncture [36415] 2)  TLB-PSA (Prostate Specific Antigen) [84153-PSA] 3)  TLB-CBC Platelet - w/Differential [85025-CBCD] 4)  TLB-TSH (Thyroid Stimulating Hormone) [84443-TSH] 5)  TLB-A1C / Hgb A1C (Glycohemoglobin) [83036-A1C] 6)  Specimen Handling [99000] 7)  Medicare -1st Annual Wellness Visit [G0438] 8)  Est. Patient Level III [09811]

## 2010-11-20 NOTE — Progress Notes (Signed)
Summary: nuc pre procedure  Phone Note Outgoing Call Call back at Home Phone [phone]   Call placed by: CHasspacher,RN Call placed to: Patient Action Taken: Phone Call Completed Reason for Call: Confirm/change Appt Summary of Call: Reviewed information on Myoview Information Sheet (see scanned document for further details).  Spoke with pt. husband.      Nuclear Med Background Indications for Stress Test: Evaluation for Ischemia, Stent Patency, PTCA Patency, Post Hospital  Indications Comments: Primary Dr.Robert Read 05/15/10 Dc'd: CP/SOB (-) enzymes Dx: Bil Pneumonia   History Comments: 98 MI/stent RCA  08/05 Cath 20% in stent RCA EF normal 7/09 Echo EF normal 06/10 MPS EF 51% Hx Afib  Symptoms: Chest Pain  Symptoms Comments: Heartburn recent admission Nonproductive cough   Nuclear Pre-Procedure Cardiac Risk Factors: Family History - CAD, History of Smoking, Hypertension, Lipids, Obesity Height (in): 68

## 2010-11-20 NOTE — Assessment & Plan Note (Signed)
Summary: rto 6 months/cbs   Vital Signs:  Patient profile:   65 year old male Weight:      232.13 pounds Pulse rate:   84 / minute Pulse rhythm:   regular BP sitting:   128 / 86  (left arm) Cuff size:   large  Vitals Entered By: Army Fossa CMA (August 01, 2010 9:43 AM) CC: 6 month f/u- fasting  Comments [facility] Got flu shot @ [facility]    History of Present Illness: Coronary artery disease, chart reviewed --->  8-11 negative stress test wonders if taking high doses of OTCsvitamins is healthy or not     Current Medications (verified): 1)  Lipitor 40 Mg Tabs (Atorvastatin Calcium) .Marland Kitchen.. 1 By Mouth Once Daily 2)  Bayer Aspirin 325 Mg Tabs (Aspirin) .... Take 3)  Zetia 10 Mg Tabs (Ezetimibe) .Marland Kitchen.. 1 Tablet By Mouth Once A Day 4)  Prilosec Otc 20 Mg Tbec (Omeprazole Magnesium) .... One Tablet By Mouth Once Daily 5)  Altace 5 Mg  Caps (Ramipril) .Marland Kitchen.. 1 Once Daily 6)  Fish Oil   Oil (Fish Oil) .... 1000mg  4 By Mouth Once Daily 7)  Medisense Thin Lancets   Misc (Lancets) .... As Directed 8)  Precision Xtra Blood Glucose   Strp (Glucose Blood) .... As Directed 9)  Niaspan 1000 Mg Cr-Tabs (Niacin (Antihyperlipidemic)) .Marland Kitchen.. 1 A Day 10)  Multivitamins   Tabs (Multiple Vitamin) .... Take 1 Tablet By Mouth Once A Day 11)  Coq10 30 Mg Caps (Coenzyme Q10) .... Take 1 Capsule By Mouth Once A Day 12)  Vitamin C 500 Mg  Tabs (Ascorbic Acid) .... Take 1 Tablet By Mouth Once A Day 13)  Vitamin E 600 Unit  Caps (Vitamin E) .... Take 1 Capsule By Mouth Once A Day 14)  Nitroglycerin 0.4 Mg Subl (Nitroglycerin) .... One Tablet Under Tongue Every 5 Minutes As Needed For Chest Pain---May Repeat Times Three  Allergies (verified): No Known Drug Allergies  Past History:  Past Medical History: Coronary artery disease, s/p stent, 6-08 (-) myoview, 8-11 negative stress test Diabetes mellitus, type II Hyperlipidemia Diverticulosis, colon hemorrhoid problems on off h/o VOCAL CORD  POLYP herniated disk- sees chiropractor-2010  Past Surgical History: Reviewed history from 11/07/2009 and no changes required. Tonsillectomy VC cyst removal oral surgery-2010   Conclusions Succesful percutaneous transluminal coronary angiography with stent placement in the proximal portion of the distal right cornary atrey, reducing the 95% stenosis to 0% residual. Succesful percutaneous transluminal coronary angiography of the distal portion of the distal right coronary artery reducing a 75% stenosis to 25% Succesful percutaneous transluminal conary angiography of the posterior descending artery reducing a 75% stenosis to 40% residual. There is TIMI-3 flow in bopth the posterior descending artery and the distal rigt coronary artery Dr. Gerri Spore, MD  Family History: Reviewed history from 11/07/2009 and no changes required. prostate ca--uncle MI-- GF lung cancer-- bro colon ca-- great aunt m-deceased-chf f-deceased-83-stroke-bladder ca  Review of Systems CV:  Denies chest pain or discomfort and shortness of breath with exertion. Neuro:  has noted himself to be forgetful times, symptoms are mild and not interfering with his activities of daily bleeding. No anxiety or depression. His family has not noted any problems. 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  Physical Exam  General:  alert and well-developed.   Lungs:  normal respiratory effort, no intercostal retractions, no accessory muscle use, and normal  breath sounds.   Heart:  normal rate, regular rhythm, and no murmur.   Psych:  Oriented X3, memory intact for recent and remote, normally interactive, good eye contact, not anxious appearing, and not depressed appearing.     Impression & Recommendations:  Problem # 1:  CORONARY ARTERY DISEASE (ICD-414.00) asymptomatic, recent stress test negative His updated medication list for this problem includes:    Bayer  Aspirin 325 Mg Tabs (Aspirin) .Marland Kitchen... Take    Altace 5 Mg Caps (Ramipril) .Marland Kitchen... 1 once daily    Nitroglycerin 0.4 Mg Subl (Nitroglycerin) ..... One tablet under tongue every 5 minutes as needed for chest pain---may repeat times three  Problem # 2:  DIABETES MELLITUS, TYPE II (ICD-250.00) encouraged to go back to his previous healthier diet declined to get more information "I know how to eat  healthy" Labs A1c goals discussed His updated medication list for this problem includes:    Bayer Aspirin 325 Mg Tabs (Aspirin) .Marland Kitchen... Take    Altace 5 Mg Caps (Ramipril) .Marland Kitchen... 1 once daily  Labs Reviewed: Creat: 0.9 (06/04/2010)    Reviewed HgBA1c results: 5.8 (02/12/2010)  5.9 (11/07/2009)  Orders: Venipuncture ([address]) TLB-A1C / Hgb A1C (Glycohemoglobin) (83036-A1C)  Problem # 3:  other issues as far as findings, I recommend a single multivitamin capsule daily  as far as his memory, he seems to be doing okay , we'll watch this for now  Complete Medication List: 1)  Lipitor 40 Mg Tabs (Atorvastatin calcium) .Marland Kitchen.. 1 by mouth once daily 2)  Bayer Aspirin 325 Mg Tabs (Aspirin) .... Take 3)  Zetia 10 Mg Tabs (Ezetimibe) .Marland Kitchen.. 1 tablet by mouth once a day 4)  Prilosec Otc 20 Mg Tbec (Omeprazole magnesium) .... One tablet by mouth once daily 5)  Altace 5 Mg Caps (Ramipril) .Marland Kitchen.. 1 once daily 6)  Fish Oil Oil (Fish oil) .... 1000mg  4 by mouth once daily 7)  Medisense Thin Lancets Misc (Lancets) .... As directed 8)  Precision Xtra Blood Glucose Strp (Glucose blood) .... As directed 9)  Niaspan 1000 Mg Cr-tabs (Niacin (antihyperlipidemic)) .Marland Kitchen.. 1 a day 10)  Multivitamins Tabs (Multiple vitamin) .... Take 1 tablet by mouth once a day 11)  Coq10 30 Mg Caps (Coenzyme q10) .... Take 1 capsule by mouth once a day 12)  Vitamin C 500 Mg Tabs (Ascorbic acid) .... Take 1 tablet by mouth once a day 13)  Vitamin E 600 Unit Caps (Vitamin e) .... Take 1 capsule by mouth once a day 14)  Nitroglycerin 0.4 Mg Subl  (Nitroglycerin) .... One tablet under tongue every 5 minutes as needed for chest pain---may repeat times three  Patient Instructions: 1)  Please schedule a follow-up appointment in 4 months .    Immunization History:  Influenza Immunization History:    Influenza:  got @ [facility]  (07/08/2010)

## 2010-11-20 NOTE — Progress Notes (Signed)
Summary: changing pharmacy to [facility] Note Refill Request Message from:  Patient  Refills Requested: Medication #1:  LANSOPRAZOLE 30 MG CPDR 1 by mouth once daily - DUE OFFICE VISIT IN MAY 2011 pt left msg VM changing [facility]ies from [facility] to [facility]; pt has ov 02/12/10 rx was filled 12/16/09 #90 . Left msg for pt   Initial call taken by: Kandice Hams,  January 09, 2010 10:26 AM

## 2010-11-20 NOTE — Letter (Signed)
Summary: Previsit letter  [facility]  [address] [place], [place] [address]   Phone: [phone]  Fax: [phone]       11/29/2009 MRN: [id number]  [facility] [place] [address] [place], [place]  [address]  Dear Harry Baker,  Welcome to the Gastroenterology Division at [facility].    You are scheduled to see a nurse for your pre-procedure visit on 12-03-09 at 11:00a.m. on the 3rd floor at [facility], [address]  We ask that you try to arrive at our office 15 minutes prior to your appointment time to allow for check-in.  Your nurse visit will consist of discussing your medical and surgical history, your immediate family medical history, and your medications.    Please bring a complete list of all your medications or, if you prefer, bring the medication bottles and we will list them.  We will need to be aware of both prescribed and over the counter drugs.  We will need to know exact dosage information as well.  If you are on blood thinners (Coumadin, Plavix, Aggrenox, Ticlid, etc.) please call our office today/prior to your appointment, as we need to consult with your physician about holding your medication.   Please be prepared to read and sign documents such as consent forms, a financial agreement, and acknowledgement forms.  If necessary, and with your consent, a friend or relative is welcome to sit-in on the nurse visit with you.  Please bring your insurance card so that we may make a copy of it.  If your insurance requires a referral to see a specialist, please bring your referral form from your primary care physician.  No co-pay is required for this nurse visit.     If you cannot keep your appointment, please call [phone] to cancel or reschedule prior to your appointment date.  This allows [place] the opportunity to schedule an appointment for another patient in need of care.    Thank you for choosing [facility] for your medical needs.  We  appreciate the opportunity to care for you.  Please visit [place] at our website  to learn more about our practice.                     Sincerely.                                                                                                                   The Gastroenterology Division

## 2010-11-20 NOTE — Progress Notes (Signed)
Summary: prior auth  Phone Note Refill Request Message from:  Fax from Pharmacy on June 16, 2010 11:35 AM  Refills Requested: Medication #1:  ZETIA 10 MG TABS 1 tablet by mouth once a day prior auth - md to call [phone]  Initial call taken by: Okey Regal [place],  June 16, 2010 11:35 AM  Follow-up for Phone Call        spoke with company- states that its is approved by the insurance it should have not been rejected. I called the pt and left a voicemail to call us back so that he would know. Army Fossa CMA  June 17, 2010 1:01 PM

## 2010-11-20 NOTE — Assessment & Plan Note (Signed)
Summary: FU/KDC   Vital Signs:  Patient profile:   65 year old male Height:      68 inches Weight:      218.8 pounds BMI:     33.39 Pulse rate:   70 / minute BP sitting:   100 / 60  Vitals Entered By: Shary Decamp (February 12, 2010 10:32 AM) CC: rov, fasting   History of Present Illness: ROV Coronary artery disease--  sees cardiology routinely, would like to get all his RFs here Diabetes-- on diet only,  no significant changes on his diet and exercise.  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 1 By Mouth Once Daily 2)  Bayer Aspirin 325 Mg Tabs (Aspirin) .... Take 3)  Zetia 10 Mg Tabs (Ezetimibe) .Marland Kitchen.. 1 Tablet By Mouth Once A Day 4)  Lansoprazole 30 Mg Cpdr (Lansoprazole) .Marland Kitchen.. 1 By Mouth Once Daily - Due Office Visit in May 2011 5)  Altace 5 Mg  Caps (Ramipril) .Marland Kitchen.. 1 Once Daily 6)  Fish Oil   Oil (Fish Oil) .... 1000mg  4 By Mouth Once Daily 7)  Medisense Thin Lancets   Misc (Lancets) .... As Directed 8)  Precision Xtra Blood Glucose   Strp (Glucose Blood) .... As Directed 9)  Viagra 100 Mg  Tabs (Sildenafil Citrate) .... 1/2 or 1 By Mouth Once Daily As Needed 10)  Niaspan 1000 Mg Cr-Tabs (Niacin (Antihyperlipidemic)) .Marland Kitchen.. 1 A Day 11)  Multivitamins   Tabs (Multiple Vitamin) .... Take 1 Tablet By Mouth Once A Day 12)  Coq10 30 Mg Caps (Coenzyme Q10) .... Take 1 Capsule By Mouth Once A Day 13)  Vitamin C 500 Mg  Tabs (Ascorbic Acid) .... Take 1 Tablet By Mouth Once A Day 14)  Vitamin E 600 Unit  Caps (Vitamin E) .... Take 1 Capsule By Mouth Once A Day 15)  Nitroglycerin 0.4 Mg Subl (Nitroglycerin) .... One Tablet Under Tongue Every 5 Minutes As Needed For Chest Pain---May Repeat Times Three  Allergies (verified): No Known Drug  Allergies  Past History:  Past Medical History: Coronary artery disease, s/p stent, 6-08 (-) myoview Diabetes mellitus, type II Hyperlipidemia Diverticulosis, colon hemorrhoid problems on off h/o VOCAL CORD POLYP herniated disk- sees chiropractor-2010  Past Surgical History: Reviewed history from 11/07/2009 and no changes required. Tonsillectomy VC cyst removal oral surgery-2010   Conclusions Succesful percutaneous transluminal coronary angiography with stent placement in the proximal portion of the distal right cornary atrey, reducing the 95% stenosis to 0% residual. Succesful percutaneous transluminal coronary angiography of the distal portion of the distal right coronary artery reducing a 75% stenosis to 25% Succesful percutaneous transluminal conary angiography of the posterior descending artery reducing a 75% stenosis to 40% residual. There is TIMI-3 flow in bopth the posterior descending artery and the distal rigt coronary artery Dr. 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 diet-- somehow healthy , count calories exercise-- stays active   Review of Systems CV:  Denies chest pain or discomfort and swelling of feet. Resp:  Denies cough and shortness of breath. GI:  Denies nausea and vomiting. MS:  Denies muscle aches and muscle weakness.  Physical Exam  General:  alert and  well-developed.   Lungs:  normal respiratory effort, no intercostal retractions, no accessory muscle use, and normal breath sounds.   Heart:  normal rate, regular rhythm, and no murmur.   Extremities:  no edema Psych:  Oriented X3, memory intact for recent and remote, normally interactive, good eye contact, not anxious appearing, and not depressed appearing.     Impression & Recommendations:  Problem # 1:  HYPERLIPIDEMIA (ICD-272.4) well controlled per last cholesterol panel.  Check  LFTs His updated medication list for this problem includes:    Lipitor 40 Mg Tabs (Atorvastatin calcium) .Marland Kitchen... 1 by mouth once daily    Zetia 10 Mg Tabs (Ezetimibe) .Marland Kitchen... 1 tablet by mouth once a day    Niaspan 1000 Mg Cr-tabs (Niacin (antihyperlipidemic)) .Marland Kitchen... 1 a day  Orders: Venipuncture ([address]) TLB-ALT (SGPT) (84460-ALT) TLB-AST (SGOT) (84450-SGOT)  Problem # 2:  CORONARY ARTERY DISEASE (ICD-414.00) asymptomatic Will start prescribing all his medications His updated medication list for this problem includes:    Bayer Aspirin 325 Mg Tabs (Aspirin) .Marland Kitchen... Take    Altace 5 Mg Caps (Ramipril) .Marland Kitchen... 1 once daily    Nitroglycerin 0.4 Mg Subl (Nitroglycerin) ..... One tablet under tongue every 5 minutes as needed for chest pain---may repeat times three  Problem # 3:  DIABETES MELLITUS, TYPE II (ICD-250.00) on diet only, Labs encourage a healthy diet His updated medication list for this problem includes:    Bayer Aspirin 325 Mg Tabs (Aspirin) .Marland Kitchen... Take    Altace 5 Mg Caps (Ramipril) .Marland Kitchen... 1 once daily  Orders: TLB-BMP (Basic Metabolic Panel-BMET) (80048-METABOL) TLB-A1C / Hgb A1C (Glycohemoglobin) (83036-A1C)  Complete Medication List: 1)  Lipitor 40 Mg Tabs (Atorvastatin calcium) .Marland Kitchen.. 1 by mouth once daily 2)  Bayer Aspirin 325 Mg Tabs (Aspirin) .... Take 3)  Zetia 10 Mg Tabs (Ezetimibe) .Marland Kitchen.. 1 tablet by mouth once a day 4)  Lansoprazole 30 Mg Cpdr (Lansoprazole) .Marland Kitchen.. 1 by mouth once daily - due office visit in may 2011 5)  Altace 5 Mg Caps (Ramipril) .Marland Kitchen.. 1 once daily 6)  Fish Oil Oil (Fish oil) .... 1000mg  4 by mouth once daily 7)  Medisense Thin Lancets Misc (Lancets) .... As directed 8)  Precision Xtra Blood Glucose Strp (Glucose blood) .... As directed 9)  Viagra 100 Mg Tabs (Sildenafil citrate) .... 1/2 or 1 by mouth once daily as needed 10)  Niaspan 1000 Mg Cr-tabs (Niacin (antihyperlipidemic)) .Marland Kitchen.. 1 a day 11)  Multivitamins Tabs (Multiple vitamin) .... Take 1 tablet by  mouth once a day 12)  Coq10 30 Mg Caps (Coenzyme q10) .... Take 1 capsule by mouth once a day 13)  Vitamin C 500 Mg Tabs (Ascorbic acid) .... Take 1 tablet by mouth once a day 14)  Vitamin E 600 Unit Caps (Vitamin e) .... Take 1 capsule by mouth once a day 15)  Nitroglycerin 0.4 Mg Subl (Nitroglycerin) .... One tablet under tongue every 5 minutes as needed for chest pain---may repeat times three  Patient Instructions: 1)  Please schedule a follow-up appointment in 6  months .  Prescriptions: NIASPAN 1000 MG CR-TABS (NIACIN (ANTIHYPERLIPIDEMIC)) 1 a day  #90 x 3   Entered by:   Shary Decamp   Authorized by:   Nolon Rod. Yoandri Congrove MD   Signed by:   Shary Decamp on 02/12/2010   Method used:   Print then Give to Patient   RxID:   [id number] ALTACE 5 MG  CAPS (RAMIPRIL) 1 once daily  #90 x 3   Entered  by:   Shary Decamp   Authorized by:   Nolon Rod. Theressa Piedra MD   Signed by:   Shary Decamp on 02/12/2010   Method used:   Print then Give to Patient   RxID:   [id number] LANSOPRAZOLE 30 MG CPDR (LANSOPRAZOLE) 1 by mouth once daily - DUE OFFICE VISIT IN MAY 2011  #90 x 3   Entered by:   Shary Decamp   Authorized by:   Nolon Rod. Mayerli Kirst MD   Signed by:   Shary Decamp on 02/12/2010   Method used:   Print then Give to Patient   RxID:   [id number] ZETIA 10 MG TABS (EZETIMIBE) 1 tablet by mouth once a day  #90 x 3   Entered by:   Shary Decamp   Authorized by:   Nolon Rod. Nimo Verastegui MD   Signed by:   Shary Decamp on 02/12/2010   Method used:   Print then Give to Patient   RxID:   [id number] LIPITOR 40 MG TABS (ATORVASTATIN CALCIUM) 1 by mouth once daily  #90 x 3   Entered by:   Shary Decamp   Authorized by:   Nolon Rod. Eligah Anello MD   Signed by:   Shary Decamp on 02/12/2010   Method used:   Print then Give to Patient   RxID:   [id number]

## 2010-11-20 NOTE — Letter (Signed)
Summary: Custom - Lipid  [facility], Main Office  [address]   [place], [place] [address]   Phone: [phone]  Fax: [phone]     June 06, 2010 MRN: [id number]   [facility] [address] [place], [place]  [address]   Dear Harry Baker,  We have reviewed your cholesterol results.  They are as follows:     Total Cholesterol:    117 (Desirable: less than 200)       HDL  Cholesterol:     41.80  (Desirable: greater than 40 for men and 50 for women)       LDL Cholesterol:       61  (Desirable: less than 100 for low risk and less than 70 for moderate to high risk)       Triglycerides:       71.0  (Desirable: less than 150)  Our recommendations include:These numbers look good. Continue on the same medicine. Sodium, potassium, kidney and  Liver function are normal. Take care, Dr. Darel Hong.    Call our office at the number listed above if you have any questions.  Lowering your LDL cholesterol is important, but it is only one of a large number of "risk factors" that may indicate that you are at risk for heart disease, stroke or other complications of hardening of the arteries.  Other risk factors include:   A.  Cigarette Smoking* B.  High Blood Pressure* C.  Obesity* D.   Low HDL Cholesterol (see yours above)* E.   Diabetes Mellitus (higher risk if your is uncontrolled) F.  Family history of premature heart disease G.  Previous history of stroke or cardiovascular disease    *These are risk factors YOU HAVE CONTROL OVER.  For more information, visit .  There is now evidence that lowering the TOTAL CHOLESTEROL AND LDL CHOLESTEROL can reduce the risk of heart disease.  The American Heart Association recommends the following guidelines for the treatment of elevated cholesterol:  1.  If there is now current heart disease and less than two risk factors, TOTAL CHOLESTEROL should be less than 200 and LDL CHOLESTEROL should be less than 100. 2.  If there is  current heart disease or two or more risk factors, TOTAL CHOLESTEROL should be less than 200 and LDL CHOLESTEROL should be less than 70.  A diet low in cholesterol, saturated fat, and calories is the cornerstone of treatment for elevated cholesterol.  Cessation of smoking and exercise are also important in the management of elevated cholesterol and preventing vascular disease.  Studies have shown that 30 to 60 minutes of physical activity most days can help lower blood pressure, lower cholesterol, and keep your weight at a healthy level.  Drug therapy is used when cholesterol levels do not respond to therapeutic lifestyle changes (smoking cessation, diet, and exercise) and remains unacceptably high.  If medication is started, it is important to have you levels checked periodically to evaluate the need for further treatment options.  Thank you,    [facility] Team

## 2010-11-20 NOTE — Miscellaneous (Signed)
Summary: Lec previsit  Clinical Lists Changes  Medications: Added new medication of MOVIPREP 100 GM  SOLR (PEG-KCL-NACL-NASULF-NA ASC-C) As per prep instructions. - Signed Rx of MOVIPREP 100 GM  SOLR (PEG-KCL-NACL-NASULF-NA ASC-C) As per prep instructions.;  #1 x 0;  Signed;  Entered by: Ulis Rias RN;  Authorized by: Louis Meckel MD;  Method used: Electronically to [facility] [address], [place], [place], [place]  [address], Ph: [phone] or [phone], Fax: [phone] Observations: Added new observation of NKA: T (12/03/2009 11:02)    Prescriptions: MOVIPREP 100 GM  SOLR (PEG-KCL-NACL-NASULF-NA ASC-C) As per prep instructions.  #1 x 0   Entered by:   Ulis Rias RN   Authorized by:   Louis Meckel MD   Signed by:   Ulis Rias RN on 12/03/2009   Method used:   Electronically to        [facility] [address] (retail)       [address] [place], [place]  [address]       Ph: [phone] or [phone]       Fax: [phone]   RxID:   [id number]

## 2010-11-20 NOTE — Letter (Signed)
Summary: Cancer Screening/[facility] Personalized Risk Profile  Cancer Screening/[facility] Personalized Risk Profile   Imported By: Lanelle Bal 11/15/2009 11:22:19  _____________________________________________________________________  External Attachment:    Type:   Image     Comment:   External Document

## 2010-11-20 NOTE — Progress Notes (Signed)
Summary: RX  Phone Note Refill Request   Refills Requested: Medication #1:  LANSOPRAZOLE 30 MG CPDR 1 by mouth once daily [phone]  Initial call taken by: Freddy Jaksch,  December 16, 2009 11:05 AM    New/Updated Medications: LANSOPRAZOLE 30 MG CPDR (LANSOPRAZOLE) 1 by mouth once daily - DUE OFFICE VISIT IN MAY 2011 Prescriptions: LANSOPRAZOLE 30 MG CPDR (LANSOPRAZOLE) 1 by mouth once daily - DUE OFFICE VISIT IN MAY 2011  #90 x 0   Entered by:   Shary Decamp   Authorized by:   Nolon Rod. Paz MD   Signed by:   Shary Decamp on 12/16/2009   Method used:   Electronically to        [facility]* (mail-order)       [address] [place], [place]  [address]       Ph: [phone]       Fax: [phone]   RxID:   [id number]

## 2010-11-20 NOTE — Progress Notes (Signed)
Summary: CALLING ABOUT NEW PRESCRIPTION  Phone Note Call from Patient Call back at Home Phone [phone]   Caller: Patient Summary of Call: PT CALLING REGARDING HIS MEDICATION  NEED NEW PRESCRIPTIONS SENT TO [facility] AT [phone] END SENDING THEM TO [facility]. Initial call taken by: Judie Grieve,  January 09, 2010 8:39 AM  Follow-up for Phone Call        looks like everything was taken care of by  Kandice Hams i tried calling pt but number i recived sound like a bussiness phone so i didnot leave massage.   Follow-up by: Kem Parkinson,  January 09, 2010 1:10 PM

## 2010-11-20 NOTE — Procedures (Signed)
Summary: Colonoscopy  Patient: Harry Baker Note: All result statuses are Final unless otherwise noted.  Tests: (1) Colonoscopy (COL)   COL Colonoscopy           DONE     [facility]     [address]     [place], [place]  [address]           COLONOSCOPY PROCEDURE REPORT           PATIENT:  Harry, Baker  MR#:  [id number]     BIRTHDATE:  09/02/1946, 63 yrs. old  GENDER:  male           ENDOSCOPIST:  Barbette Hair. Arlyce Dice, MD     Referred by:           PROCEDURE DATE:  12/13/2009     PROCEDURE:  Colonoscopy with snare polypectomy     ASA CLASS:  Class II     INDICATIONS:  Routine Risk Screening           MEDICATIONS:   Fentanyl 50 mcg IV, Versed 7 mg IV           DESCRIPTION OF PROCEDURE:   After the risks benefits and     alternatives of the procedure were thoroughly explained, informed     consent was obtained.  Digital rectal exam was performed and     revealed no abnormalities.   The LB CF-[id number] endoscope     was introduced through the anus and advanced to the cecum, which     was identified by both the appendix and ileocecal valve, without     limitations.  The quality of the prep was excellent, using     MoviPrep.  The instrument was then slowly withdrawn as the colon     was fully examined.     <<PROCEDUREIMAGES>>           FINDINGS:  A sessile polyp was found in the mid transverse colon.     It was 5 mm in size. Polyp was snared, then cauterized with     monopolar cautery. Retrieval was successful (see image9). snare     polyp Irregular polyp  Moderate diverticulosis was found in the     sigmoid colon (see image1).  Internal hemorrhoids were found (see     image17).  This was otherwise a normal examination of the colon     (see image2, image3, image4, image5, image7, image8, image15, and     image16).   Retroflexed views in the rectum revealed no     abnormalities.    The scope was then withdrawn from the patient     and the procedure completed.         COMPLICATIONS:  None           ENDOSCOPIC IMPRESSION:     1) 5 mm sessile polyp in the mid transverse colon     2) Moderate diverticulosis in the sigmoid colon     3) Internal hemorrhoids     4) Otherwise normal examination     RECOMMENDATIONS:     1) If the polyp(s) removed today are proven to be adenomatous     (pre-cancerous) polyps, you will need a repeat colonoscopy in 5     years. Otherwise you should continue to follow colorectal cancer     screening guidelines for "routine risk" patients with colonoscopy     in 10 years.  REPEAT EXAM:   You will receive a letter from Dr. Arlyce Dice in 1-2     weeks, after reviewing the final pathology, with followup     recommendations.           ______________________________     Barbette Hair Arlyce Dice, MD           CC:  Willow Ora, MD           n.     Rosalie Doctor:   Barbette Hair. Kaplan at 12/13/2009 08:39 AM           Page 2 of 3   [place], [place], [place] [id number]  Note: An exclamation mark (!) indicates a result that was not dispersed into the flowsheet. Document Creation Date: 12/13/2009 8:40 AM _______________________________________________________________________  (1) Order result status: Final Collection or observation date-time: 12/13/2009 08:30 Requested date-time:  Receipt date-time:  Reported date-time:  Referring Physician:   Ordering Physician: Melvia Heaps [phone]) Specimen Source:  Source: Launa Grill Order Number: [id number] Lab site:   Appended Document: Colonoscopy     Procedures Next Due Date:    Colonoscopy: 11/2019

## 2010-11-20 NOTE — Assessment & Plan Note (Signed)
Summary: [facility]   Visit Type:  1 year follow up  CC:  No complains.  History of Present Illness: Harry Baker is a pleasant gentleman with a history of coronary artery disease with prior PCI of his right coronary artery in August 2000.  He also had residual LAD disease at that time.  His most recent Myoview on March 31, 2007, showed no ischemia or infarction and ejection fraction of 58%.  He also had an abdominal ultrasound in July 2007 that showed no aneurysm.  He has also had previous carotid Dopplers performed on September 24, 2005, which showed normal carotid arteries. I last saw him in July of 2010. Since then he denies any dyspnea on exertion, orthopnea, PND, pedal edema, palpitations, syncope or chest pain.   Current Medications (verified): 1)  Lipitor 40 Mg Tabs (Atorvastatin Calcium) .Marland Kitchen.. 1 By Mouth Once Daily 2)  Bayer Aspirin 325 Mg Tabs (Aspirin) .... Take 3)  Zetia 10 Mg Tabs (Ezetimibe) .Marland Kitchen.. 1 Tablet By Mouth Once A Day 4)  Lansoprazole 30 Mg Cpdr (Lansoprazole) .Marland Kitchen.. 1 By Mouth Once Daily - Due Office Visit in May 2011 5)  Altace 5 Mg  Caps (Ramipril) .Marland Kitchen.. 1 Once Daily 6)  Fish Oil   Oil (Fish Oil) .... 1000mg  4 By Mouth Once Daily 7)  Medisense Thin Lancets   Misc (Lancets) .... As Directed 8)  Precision Xtra Blood Glucose   Strp (Glucose Blood) .... As Directed 9)  Viagra 100 Mg  Tabs (Sildenafil Citrate) .... 1/2 or 1 By Mouth Once Daily As Needed 10)  Niaspan 1000 Mg Cr-Tabs (Niacin (Antihyperlipidemic)) .Marland Kitchen.. 1 A Day 11)  Multivitamins   Tabs (Multiple Vitamin) .... Take 1 Tablet By Mouth Once A Day 12)  Coq10 30 Mg Caps (Coenzyme Q10) .... Take 1 Capsule By Mouth Once A Day 13)  Vitamin C 500 Mg  Tabs (Ascorbic Acid) .... Take 1 Tablet By Mouth Once A Day 14)  Vitamin E 600 Unit  Caps (Vitamin E) .... Take 1 Capsule By Mouth Once A Day 15)  Nitroglycerin 0.4 Mg Subl (Nitroglycerin) .... One Tablet Under Tongue Every 5 Minutes As Needed For Chest Pain---May Repeat  Times Three  Allergies (verified): No Known Drug Allergies  Past History:  Past Medical History: Reviewed history from 02/12/2010 and no changes required. 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 Tonsillectomy VC cyst removal oral surgery-2010   Conclusions Succesful percutaneous transluminal coronary angiography with stent placement in the proximal portion of the distal right cornary atrey, reducing the 95% stenosis to 0% residual. Succesful percutaneous transluminal coronary angiography of the distal portion of the distal right coronary artery reducing a 75% stenosis to 25% Succesful percutaneous transluminal conary angiography of the posterior descending artery reducing a 75% stenosis to 40% residual. There is TIMI-3 flow in bopth the posterior descending artery and the distal rigt coronary artery Dr. Gerri Spore, MD  Social History: Reviewed history from 02/12/2010 and no changes required. 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 diet-- somehow healthy , count calories exercise-- stays active   Review of Systems       Occasional headache but no fevers or chills, productive cough, hemoptysis, dysphasia, odynophagia, melena, hematochezia, dysuria, hematuria, rash, seizure activity, orthopnea, PND,  pedal edema, claudication. Remaining systems are negative.   Vital Signs:  Patient profile:   65 year old male Height:      68 inches Weight:      225.50 pounds BMI:     34.41 Pulse rate:   83 / minute Pulse rhythm:   regular Resp:     18 per minute BP sitting:   110 / 70  (left arm) Cuff size:   large  Vitals Entered By: Vikki Ports (May 21, 2010  12:32 PM)  Physical Exam  General:  Well-developed well-nourished in no acute distress.  Skin is warm and dry.  HEENT is normal.  Neck is supple. No thyromegaly.  Chest is clear to auscultation with normal expansion.  Cardiovascular exam is regular rate and rhythm.  Abdominal exam nontender or distended. No masses palpated. Extremities show no edema. neuro grossly intact    EKG  Procedure date:  05/21/2010  Findings:      Sinus rhythm at a rate of 83. Axis normal. No ST changes.  Impression & Recommendations:  Problem # 1:  HYPERLIPIDEMIA (ICD-272.4) Continue present medications. Check lipids and liver. His updated medication list for this problem includes:    Lipitor 40 Mg Tabs (Atorvastatin calcium) .Marland Kitchen... 1 by mouth once daily    Zetia 10 Mg Tabs (Ezetimibe) .Marland Kitchen... 1 tablet by mouth once a day    Niaspan 1000 Mg Cr-tabs (Niacin (antihyperlipidemic)) .Marland Kitchen... 1 a day  Problem # 2:  CORONARY ARTERY DISEASE (ICD-414.00) Continue aspirin, ACE inhibitor and statin. Schedule Myoview for risk stratification. His updated medication list for this problem includes:    Bayer Aspirin 325 Mg Tabs (Aspirin) .Marland Kitchen... Take    Altace 5 Mg Caps (Ramipril) .Marland Kitchen... 1 once daily    Nitroglycerin 0.4 Mg Subl (Nitroglycerin) ..... One tablet under tongue every 5 minutes as needed for chest pain---may repeat times three  Orders: Nuclear Stress Test (Nuc Stress Test)  Problem # 3:  DIABETES MELLITUS, TYPE II (ICD-250.00)  His updated medication list for this problem includes:    Bayer Aspirin 325 Mg Tabs (Aspirin) .Marland Kitchen... Take    Altace 5 Mg Caps (Ramipril) .Marland Kitchen... 1 once daily  Problem # 4:  GERD (ICD-530.81)  His updated medication list for this problem includes:    Prilosec Otc 20 Mg Tbec (Omeprazole magnesium) ..... One tablet by mouth once daily  Patient Instructions: 1)  Your physician recommends that you schedule a follow-up appointment in: ONE YEAR 2)  Your physician recommends that you  return for lab work ON:GEXB STRESS TEST 3)  Your physician has requested that you have an exercise stress myoview.  For further information please visit [email or web address]  Please follow instruction sheet, as given. Prescriptions: PRILOSEC OTC 20 MG TBEC (OMEPRAZOLE MAGNESIUM) ONE Tablet by mouth once daily  #90 x 0   Entered by:   Deliah Goody, RN   Authorized by:   Ferman Hamming, MD, [facility]   Signed by:   Deliah Goody, RN on 05/21/2010   Method used:   Print then Give to Patient   RxID:   [id number]

## 2010-11-20 NOTE — Letter (Signed)
Summary: [facility] Instructions  [facility]  [address] [place], [place] [address]   Phone: [phone]  Fax: [phone]       Harry EDELEN    10-Sep-1960    MRN: [id number]        Procedure Day /Date:  12/13/09  Friday     Arrival Time: 7:30am      Procedure Time: 8:00am     Location of Procedure:                    _x _  [facility] (4th Floor)                        PREPARATION FOR COLONOSCOPY WITH MOVIPREP   Starting 5 days prior to your procedure _ 2/20/11_ do not eat nuts, seeds, popcorn, corn, beans, peas,  salads, or any raw vegetables.  Do not take any fiber supplements (e.g. Metamucil, Citrucel, and Benefiber).  THE DAY BEFORE YOUR PROCEDURE         DATE:  12/12/09  DAY:   Thursday  1.  Drink clear liquids the entire day-NO SOLID FOOD  2.  Do not drink anything colored red or purple.  Avoid juices with pulp.  No orange juice.  3.  Drink at least 64 oz. (8 glasses) of fluid/clear liquids during the day to prevent dehydration and help the prep work efficiently.  CLEAR LIQUIDS INCLUDE: Water Jello Ice Popsicles Tea (sugar ok, no milk/cream) Powdered fruit flavored drinks Coffee (sugar ok, no milk/cream) Gatorade Juice: apple, white grape, white cranberry  Lemonade Clear bullion, consomm, broth Carbonated beverages (any kind) Strained chicken noodle soup Hard Candy                             4.  In the morning, mix first dose of MoviPrep solution:    Empty 1 Pouch A and 1 Pouch B into the disposable container    Add lukewarm drinking water to the top line of the container. Mix to dissolve    Refrigerate (mixed solution should be used within 24 hrs)  5.  Begin drinking the prep at 5:00 p.m. The MoviPrep container is divided by 4 marks.   Every 15 minutes drink the solution down to the next mark (approximately 8 oz) until the full liter is complete.   6.  Follow completed prep with 16 oz of clear liquid of your choice  (Nothing red or purple).  Continue to drink clear liquids until bedtime.  7.  Before going to bed, mix second dose of MoviPrep solution:    Empty 1 Pouch A and 1 Pouch B into the disposable container    Add lukewarm drinking water to the top line of the container. Mix to dissolve    Refrigerate  THE DAY OF YOUR PROCEDURE      DATE:  12/13/09  DAY:  Friday  Beginning at 3:00 a.m. (5 hours before procedure):         1. Every 15 minutes, drink the solution down to the next mark (approx 8 oz) until the full liter is complete.  2. Follow completed prep with 16 oz. of clear liquid of your choice.    3. You may drink clear liquids until  6:00am  (2 HOURS BEFORE PROCEDURE).   MEDICATION INSTRUCTIONS  Unless otherwise instructed, you should take regular prescription medications with a small sip of water  as early as possible the morning of your procedure.          OTHER INSTRUCTIONS  You will need a responsible adult at least 64 years of age to accompany you and drive you home.   This person must remain in the waiting room during your procedure.  Wear loose fitting clothing that is easily removed.  Leave jewelry and other valuables at home.  However, you may wish to bring a book to read or  an iPod/MP3 player to listen to music as you wait for your procedure to start.  Remove all body piercing jewelry and leave at home.  Total time from sign-in until discharge is approximately 2-3 hours.  You should go home directly after your procedure and rest.  You can resume normal activities the  day after your procedure.  The day of your procedure you should not:   Drive   Make legal decisions   Operate machinery   Drink alcohol   Return to work  You will receive specific instructions about eating, activities and medications before you leave.    The above instructions have been reviewed and explained to me by   Ulis Rias RN  December 03, 2009 11:30 AM     I fully  understand and can verbalize these instructions _____________________________ Date _________

## 2011-03-03 NOTE — Assessment & Plan Note (Signed)
[facility]                            CARDIOLOGY OFFICE NOTE   NAME:Kassabian, JEBEDIAH MACRAE                      MRN:          [id number]  DATE:04/15/2007                            DOB:          08/03/1946    CLINIC NOTE   [name] is a gentleman who has a history of coronary artery disease.  He has had prior PCI of his right coronary artery with residual LAD  disease.  He did have a Myoview performed on March 31, 2007.  There was  no ischemia or infarction, and he did not have chest pain or EKG  changes.  He has also had previous carotid Dopplers, which showed no  obstructive disease and no aneurysm.  He has not had chest pain since  seen previously, nor is he complaining of dyspnea or pedal edema.   MEDICATIONS:  1. Multiple vitamins.  2. Prevacid 30 mg p.o. daily.  3. Fish oil.  4. Aspirin 325 mg p.o. daily.  5. Altace 5 mg p.o. daily.  6. Zetia 10 mg p.o. daily.  7. Niaspan 1 g p.o. daily.  8. Lipitor 40 mg p.o. daily.  9. Coenzyme Q.   PHYSICAL EXAM:  Blood pressure of 115/77, pulse 70.  HEENT:  Normal.  NECK:  Supple.  CHEST:  Clear.  CARDIOVASCULAR:  Regular rate and rhythm.  ABDOMEN:  Benign.  EXTREMITIES:  No edema.   ELECTROCARDIOGRAM:  Shows a sinus rhythm at a rate of 76.  There are no  significant ST changes.   DIAGNOSES:  1. Coronary artery disease - he has had no chest pain or shortness of      breath and his Myoview is normal.  We will continue with medical      therapy, including risk factor modification.  He will continue with      his aspirin, Statin, Niaspan, and Zetia.  He will also continue on      his ACE inhibitor.  I will check a BMET today to follow his      potassium and renal function.  2. Hyperlipidemia - his most recent lipids were outstanding other than      a mildly reduced HDL at 34.  We will make no changes.   We discussed risk factor modification including diet and exercise, and  note he does not smoke.   I will see him back in 1 year.     Madolyn Frieze Jens Som, MD, [facility]  Electronically Signed    BSC/MedQ  DD: 04/15/2007  DT: 04/15/2007  Job #: [id number]

## 2011-03-03 NOTE — Assessment & Plan Note (Signed)
[facility]                            CARDIOLOGY OFFICE NOTE   NAME:Harry Baker, Harry Baker                      MRN:          [id number]  DATE:04/17/2008                            DOB:          Sep 16, 1946    Harry Baker is a pleasant gentleman with a history of coronary artery  disease with prior PCI of his right coronary artery in August 2000.  He  also had residual LAD disease at that time.  His most recent Myoview on  March 31, 2007, showed no ischemia or infarction and ejection fraction of  58%.  He also had an abdominal ultrasound in July 2007 that showed no  aneurysm.  He has also had previous carotid Dopplers performed on  September 24, 2005, which showed normal coronary arteries.  Since I last  saw him, there is no dyspnea, chest pain, palpitations, or syncope.  There is no pedal edema.   MEDICATIONS:  1. Multivitamin.  2. Zinc.  3. Vitamin C.  4. Vitamin E.  5. Omega fish oil.  6. Prevacid.  7. Aspirin 325 mg p.o. daily.  8. Altace 5 mg p.o. daily.  9. Zetia 10 mg p.o. daily.  10.Niaspan has been discontinued.  11.Lipitor 40 mg p.o. daily.  12.Coenzyme Q.   PHYSICAL EXAMINATION:  VITAL SIGNS:  Today shows a blood pressure of  115/70 and pulse is 80.  He weighs 247 pounds.  HEENT:  Normal.  NECK:  Supple with no bruits.  CHEST:  Clear.  CARDIOVASCULAR:  Regular rate and rhythm.  ABDOMEN:  No tenderness.  EXTREMITIES:  No edema.   Electrocardiogram shows a sinus rhythm at a rate of 80.  There are no ST  changes noted.   DIAGNOSES:  1. Coronary artery disease - the patient has had no chest pain or      shortness of breath and his Myoview recently was normal.  We will      continue with medical therapy to include his aspirin, statin,      Zetia, and ACE inhibitor.  We also discussed risk factor      modification including diet and exercise.  He does not smoke.  2. Hyperlipidemia - he will continue on his statin and Zetia, and we  will check lipids and liver and adjust as indicated.   We will see him back in 1 year.     Madolyn Frieze Jens Som, MD, [facility]  Electronically Signed    BSC/MedQ  DD: 04/17/2008  DT: 04/18/2008  Job #: [id number]

## 2011-03-06 NOTE — Op Note (Signed)
   NAME:  [name], [name]                         ACCOUNT NO.:  [id number]   MEDICAL RECORD NO.:  [id number]                   PATIENT TYPE:  AMB   LOCATION:  DSC                                  FACILITY:  [facility]   PHYSICIAN:  Christopher E. Ezzard Standing, M.D.         DATE OF BIRTH:  Mar 19, 1946   DATE OF PROCEDURE:  05/22/2003  DATE OF DISCHARGE:                                 OPERATIVE REPORT   PREOPERATIVE DIAGNOSIS:  Left anterior true vocal cord nodule.   POSTOPERATIVE DIAGNOSIS:  Left anterior vocal cord cyst.   OPERATION:  Microlaryngoscopy with excision of a left anterior vocal cord  cyst.   SURGEON:  Kristine Garbe. Ezzard Standing, M.D.   ANESTHESIA:  General endotracheal.   COMPLICATIONS:  None.   BRIEF CLINICAL NOTE:  Harry Baker is a 65 year old gentleman who has had  hoarseness now for several years.  He recently underwent endoscopy and was  noted to have a nodule on the left anterior true vocal cord.  He is taken to  the operating room at this time for microlaryngoscopy and excision of left  anterior vocal cord nodule.   DESCRIPTION OF PROCEDURE:  After adequate endotracheal anesthesia, direct  laryngoscopy was performed.  The patient's tongue and epiglottis were all  normal in appearance, AE folds, piriform sinuses were clear.  On evaluation  of vocal cords, the patient had a nodular mass anterior left true vocal  cord.  The laryngoscope was suspended.  Using cup forceps and scissors, the  nodule was excised.  On excising the nodule, a thick mucoid fluid was  aspirated and findings were consistent with a vocal cord cyst.  The cyst was  removed, photos were obtained.  An adrenalin-soaked cotton pledget was  placed for hemostasis.  The right vocal cord appeared normal.  This  completed the procedure.  The patient was awoken from anesthesia and  transferred to the recovery room postop doing well.   DISPOSITION:  Harry Baker is discharged home later this morning, instructed on  voice rest for the next two weeks.  Will have him follow up in two weeks for  recheck.                                               Kristine Garbe. Ezzard Standing, M.D.    CEN/MEDQ  D:  05/22/2003  T:  05/22/2003  Job:  [id number]   cc:   Angelena Sole, M.D. [facility]

## 2011-03-17 ENCOUNTER — Other Ambulatory Visit: Payer: Self-pay | Admitting: Internal Medicine

## 2011-05-04 ENCOUNTER — Encounter: Payer: Self-pay | Admitting: Internal Medicine

## 2011-05-05 ENCOUNTER — Ambulatory Visit (INDEPENDENT_AMBULATORY_CARE_PROVIDER_SITE_OTHER): Payer: PRIVATE HEALTH INSURANCE | Admitting: Internal Medicine

## 2011-05-05 ENCOUNTER — Encounter: Payer: Self-pay | Admitting: Internal Medicine

## 2011-05-05 DIAGNOSIS — E785 Hyperlipidemia, unspecified: Secondary | ICD-10-CM

## 2011-05-05 DIAGNOSIS — E119 Type 2 diabetes mellitus without complications: Secondary | ICD-10-CM

## 2011-05-05 DIAGNOSIS — I2510 Atherosclerotic heart disease of native coronary artery without angina pectoris: Secondary | ICD-10-CM

## 2011-05-05 LAB — COMPREHENSIVE METABOLIC PANEL
Albumin: 4.4 g/dL (ref 3.5–5.2)
Alkaline Phosphatase: 65 U/L (ref 39–117)
BUN: 24 mg/dL — ABNORMAL HIGH (ref 6–23)
CO2: 27 mEq/L (ref 19–32)
Calcium: 8.9 mg/dL (ref 8.4–10.5)
Chloride: 106 mEq/L (ref 96–112)
GFR: 64.72 mL/min (ref >60.00)
Glucose, Bld: 118 mg/dL — ABNORMAL HIGH (ref 70–99)
Potassium: 4.1 mEq/L (ref 3.5–5.1)

## 2011-05-05 LAB — LIPID PANEL
Cholesterol: 147 mg/dL (ref 0–200)
Triglycerides: 151 mg/dL — ABNORMAL HIGH (ref 0.0–149.0)

## 2011-05-05 NOTE — Assessment & Plan Note (Signed)
Life style controlled, check labs

## 2011-05-05 NOTE — Progress Notes (Signed)
  Subjective:    Patient ID: Harry Baker, male    DOB: Mar 27, 1946, 65 y.o.   MRN: [id number]  HPI Routine office visit Doing well, taking the same medications except that has stopped vitamin E and vitamin C in favor of a multivitamin  Past Medical History  Diagnosis Date  . CAD (coronary artery disease)     s/p stent 6/08(-) myoview, 8/11 negative stress test  . Diabetes mellitus   . Hyperlipidemia   . Diverticulosis of colon   . Hemorrhoid     problems off and on  . Vocal cord polyp   . Herniated disc     sess chiropractor 2010   Past Surgical History  Procedure Date  . Tonsillectomy   . Vc cyst removal   . Mouth surgery 2010     Review of Systems Denies chest pain, shortness of breath, no myalgias. No ambulatory blood pressure checks or blood sugar checks.    Objective:   Physical Exam  Constitutional: He is oriented to person, place, and time. He appears well-developed and well-nourished. No distress.  HENT:  Head: Normocephalic and atraumatic.  Cardiovascular: Normal rate, regular rhythm and normal heart sounds.   No murmur heard. Pulmonary/Chest: Effort normal and breath sounds normal. No respiratory distress. He has no wheezes. He has no rales.  Musculoskeletal: He exhibits no edema.  Neurological: He is alert and oriented to person, place, and time.  Skin: He is not diaphoretic.  Psychiatric: He has a normal mood and affect. His behavior is normal. Judgment and thought content normal.          Assessment & Plan:

## 2011-05-05 NOTE — Assessment & Plan Note (Signed)
Good med compliance, check  Labs

## 2011-05-05 NOTE — Assessment & Plan Note (Addendum)
05-2010:  Neg stress test Currently asx

## 2011-06-15 ENCOUNTER — Other Ambulatory Visit: Payer: Self-pay | Admitting: Internal Medicine

## 2011-06-23 ENCOUNTER — Telehealth: Payer: Self-pay | Admitting: Cardiology

## 2011-06-23 NOTE — Telephone Encounter (Signed)
error 

## 2011-07-14 ENCOUNTER — Encounter: Payer: Self-pay | Admitting: Cardiology

## 2011-07-14 ENCOUNTER — Encounter: Payer: Self-pay | Admitting: *Deleted

## 2011-07-15 ENCOUNTER — Encounter: Payer: Self-pay | Admitting: Cardiology

## 2011-07-15 ENCOUNTER — Ambulatory Visit (INDEPENDENT_AMBULATORY_CARE_PROVIDER_SITE_OTHER): Payer: PRIVATE HEALTH INSURANCE | Admitting: Cardiology

## 2011-07-15 DIAGNOSIS — E785 Hyperlipidemia, unspecified: Secondary | ICD-10-CM

## 2011-07-15 DIAGNOSIS — I2510 Atherosclerotic heart disease of native coronary artery without angina pectoris: Secondary | ICD-10-CM

## 2011-07-15 DIAGNOSIS — E7800 Pure hypercholesterolemia, unspecified: Secondary | ICD-10-CM

## 2011-07-15 DIAGNOSIS — I10 Essential (primary) hypertension: Secondary | ICD-10-CM

## 2011-07-15 NOTE — Patient Instructions (Signed)
Your physician wants you to follow-up in: ONE YEAR You will receive a reminder letter in the mail two months in advance. If you don't receive a letter, please call our office to schedule the follow-up appointment.   Your physician recommends that you return for a FASTING lipid profile: WHEN ABLE

## 2011-07-15 NOTE — Assessment & Plan Note (Signed)
Continue statin. Check lipids and liver. 

## 2011-07-15 NOTE — Assessment & Plan Note (Signed)
Continue aspirin, ACE inhibitor and statin. Last Myoview low risk.

## 2011-07-15 NOTE — Progress Notes (Signed)
HPI:Harry Baker is a pleasant gentleman with a history of coronary artery disease with prior PCI of his right coronary artery in August 2000.  He also had residual LAD disease at that time.  His most recent Myoview in August of 2011 showed no ischemia or infarction and ejection fraction of 66%.  He also had an abdominal ultrasound in July 2007 that showed no aneurysm.  He has also had previous carotid Dopplers performed on September 24, 2005, which showed normal carotid arteries. I last saw him in August of 2011. Since then he denies any dyspnea on exertion, orthopnea, PND, pedal edema, palpitations, syncope or chest pain.   Current Outpatient Prescriptions  Medication Sig Dispense Refill  . aspirin 325 MG tablet Take 325 mg by mouth daily.        Marland Kitchen co-enzyme Q-10 30 MG capsule Take 30 mg by mouth 3 (three) times daily.        . fish oil-omega-3 fatty acids 1000 MG capsule Take 2 g by mouth daily.        Marland Kitchen LIPITOR 40 MG tablet TAKE ONE TABLET BY MOUTH EVERY DAY  90 each  0  . Multiple Vitamin (MULTIVITAMIN) capsule Take 1 capsule by mouth daily.        Marland Kitchen NIASPAN 1000 MG CR tablet TAKE ONE TABLET BY MOUTH EVERY DAY  90 each  0  . nitroGLYCERIN (NITROSTAT) 0.4 MG SL tablet Place 0.4 mg under the tongue every 5 (five) minutes as needed.        Marland Kitchen omeprazole (PRILOSEC OTC) 20 MG tablet Take 20 mg by mouth daily.        . ramipril (ALTACE) 5 MG capsule TAKE ONE CAPSULE BY MOUTH EVERY DAY  90 capsule  1  . ZETIA 10 MG tablet TAKE ONE TABLET BY MOUTH EVERY DAY  90 each  0     Past Medical History  Diagnosis Date  . CAD (coronary artery disease)     s/p stent 6/08(-) myoview, 8/11 negative stress test  . Diabetes mellitus   . Hyperlipidemia   . Diverticulosis of colon   . Hemorrhoid     problems off and on  . Vocal cord polyp   . Herniated disc     sess chiropractor 2010  . GERD   . ERECTILE DYSFUNCTION     Past Surgical History  Procedure Date  . Tonsillectomy   . Vc cyst removal   . Mouth  surgery 2010    History   Social History  . Marital Status: Married    Spouse Name: N/A    Number of Children: N/A  . Years of Education: N/A   Occupational History  . Not on file.   Social History Main Topics  . Smoking status: Former Smoker    Types: Cigars  . Smokeless tobacco: Not on file  . Alcohol Use: Yes     wine socially  . Drug Use: Not on file  . Sexually Active: Not on file   Other Topics Concern  . Not on file   Social History Narrative   Diet- needs to go back to a healthier diet, and calorie countExercise- stays active     ROS: no fevers or chills, productive cough, hemoptysis, dysphasia, odynophagia, melena, hematochezia, dysuria, hematuria, rash, seizure activity, orthopnea, PND, pedal edema, claudication. Remaining systems are negative.  Physical Exam: Well-developed well-nourished in no acute distress.  Skin is warm and dry.  HEENT is normal.  Neck is supple. No thyromegaly.  Chest is clear to auscultation with normal expansion.  Cardiovascular exam is regular rate and rhythm.  Abdominal exam nontender or distended. No masses palpated. Extremities show no edema. neuro grossly intact  ECG NSR, no ST changes

## 2011-07-17 LAB — BASIC METABOLIC PANEL WITH GFR
CO2: 28 mEq/L (ref 19–32)
Calcium: 9.1 mg/dL (ref 8.4–10.5)
GFR, Est Non African American: >60 mL/min (ref >60)
Sodium: 141 mEq/L (ref 135–145)

## 2011-08-12 ENCOUNTER — Ambulatory Visit: Payer: PRIVATE HEALTH INSURANCE

## 2011-09-07 ENCOUNTER — Other Ambulatory Visit: Payer: Self-pay | Admitting: Internal Medicine

## 2011-09-08 ENCOUNTER — Other Ambulatory Visit: Payer: Self-pay | Admitting: Internal Medicine

## 2011-11-18 ENCOUNTER — Ambulatory Visit (INDEPENDENT_AMBULATORY_CARE_PROVIDER_SITE_OTHER): Payer: PRIVATE HEALTH INSURANCE | Admitting: Internal Medicine

## 2011-11-18 ENCOUNTER — Telehealth: Payer: Self-pay | Admitting: Internal Medicine

## 2011-11-18 VITALS — BP 132/86 | HR 110 | Temp 98.5°F | Wt 246.0 lb

## 2011-11-18 DIAGNOSIS — J029 Acute pharyngitis, unspecified: Secondary | ICD-10-CM

## 2011-11-18 MED ORDER — PREDNISONE 10 MG PO TABS: ORAL_TABLET | ORAL | Status: DC

## 2011-11-18 MED ORDER — AMOXICILLIN 500 MG PO CAPS: 1000.0000 mg | ORAL_CAPSULE | Freq: Two times a day (BID) | ORAL | Status: AC

## 2011-11-18 NOTE — Progress Notes (Signed)
  Subjective:    Patient ID: Harry Baker, male    DOB: April 07, 1946, 66 y.o.   MRN: [id number]  HPI Acute visit Symptoms started a week ago with head and chest congestion, ears feel clogged, cough which is now improving. Yesterday he felt like his throat was swelling, started to feel like he could not breath  last night but symptoms quickly subsided.  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  Review of Systems Subjective fever, no chills. No nausea, vomiting, diarrhea. No myalgias. Denies nasal discharge, it have a small amount of white sputum.    Objective:   Physical Exam  Constitutional: He appears well-developed and well-nourished.  HENT:  Head: Normocephalic and atraumatic.  Right Ear: External ear normal.  Left Ear: External ear normal.       Throat is slightly red, few white bumps on the soft palate, uvula is slightly enlarged but midline. Otherwise throat is symmetric. No drooling noted, no stridor  Cardiovascular: Normal rate, regular rhythm and normal heart sounds.   No murmur heard. Pulmonary/Chest:       Few rhonchi otherwise normal. No increased worker breathing        Assessment & Plan:  Pharyngitis: Patient presents with pharyngitis, uvula is slightly swollen. In addition to antibiotics, I'll prescribed prednisone to help with the swelling, patient very aware that needs to go to the ER if  he has any difficulty swallowing or breathing.

## 2011-11-18 NOTE — Patient Instructions (Signed)
Rest, fluids , tylenol For cough, take Mucinex DM twice a day as needed  Prednisone x 5 days Take the antibiotic as prescribed ----> Amoxicillin Call if no better in few days Call anytime if the symptoms are severe ER - 911 if difficulty breathing or swallowing

## 2011-11-18 NOTE — Telephone Encounter (Signed)
Patient called the office in regards to his appointment on Monday 11/23/11 for a CPE. He stated that he wants to be tested for Lyme disease when he gets his lab work done. His dog had lyme disease but has been treated for it, patient believes that he may have gotten it (lyme) because he was bitten by a tick. Best number to reach him is [phone].

## 2011-11-19 NOTE — Telephone Encounter (Signed)
Please advise 

## 2011-11-19 NOTE — Telephone Encounter (Signed)
Ok, will test

## 2011-11-23 ENCOUNTER — Ambulatory Visit (INDEPENDENT_AMBULATORY_CARE_PROVIDER_SITE_OTHER): Payer: PRIVATE HEALTH INSURANCE | Admitting: Internal Medicine

## 2011-11-23 ENCOUNTER — Encounter: Payer: Self-pay | Admitting: Internal Medicine

## 2011-11-23 DIAGNOSIS — E119 Type 2 diabetes mellitus without complications: Secondary | ICD-10-CM

## 2011-11-23 DIAGNOSIS — Z9189 Other specified personal risk factors, not elsewhere classified: Secondary | ICD-10-CM

## 2011-11-23 DIAGNOSIS — Z23 Encounter for immunization: Secondary | ICD-10-CM

## 2011-11-23 DIAGNOSIS — Z0000 Encounter for general adult medical examination without abnormal findings: Secondary | ICD-10-CM | POA: Insufficient documentation

## 2011-11-23 LAB — BASIC METABOLIC PANEL
Calcium: 9.1 mg/dL (ref 8.4–10.5)
GFR: 72.19 mL/min (ref >60.00)
Glucose, Bld: 92 mg/dL (ref 70–99)
Potassium: 3.9 mEq/L (ref 3.5–5.1)
Sodium: 140 mEq/L (ref 135–145)

## 2011-11-23 LAB — CBC WITH DIFFERENTIAL/PLATELET
Basophils Absolute: 0.1 10*3/uL (ref 0.0–0.1)
Eosinophils Absolute: 0.3 10*3/uL (ref 0.0–0.7)
Lymphocytes Relative: 23.8 % (ref 12.0–46.0)
MCHC: 34 g/dL (ref 30.0–36.0)
Neutrophils Relative %: 62.6 % (ref 43.0–77.0)
RBC: 4.64 Mil/uL (ref 4.22–5.81)
RDW: 13.3 % (ref 11.5–14.6)

## 2011-11-23 LAB — LIPID PANEL
Cholesterol: 108 mg/dL (ref 0–200)
LDL Cholesterol: 48 mg/dL (ref 0–99)
Triglycerides: 71 mg/dL (ref 0.0–149.0)
VLDL: 14.2 mg/dL (ref 0.0–40.0)

## 2011-11-23 LAB — AST: AST: 30 U/L (ref 0–37)

## 2011-11-23 LAB — HEMOGLOBIN A1C: Hgb A1c MFr Bld: 6.6 % — ABNORMAL HIGH (ref 4.6–6.5)

## 2011-11-23 NOTE — Progress Notes (Signed)
  Subjective:    Patient ID: Harry Baker, male    DOB: 1946/09/01, 66 y.o.   MRN: [id number]  HPI CPX  Past Medical History:  Coronary artery disease, s/p stent, 6-08 (-) myoview, 8-11 negative stress test  Diabetes mellitus, type II  Hyperlipidemia  Diverticulosis, colon  hemorrhoid problems on off  h/o Vocal cord polyp  herniated disk- sees chiropractor-2010   Past Surgical History:  Tonsillectomy  VC cyst removal  oral surgery-2010   Family History:  prostate ca--uncle  lung cancer-- bro  Brain tumor-- B colon ca-- great aunt  CHF-- M  MI-- GF Stroke-- F  f-deceased-83-stroke-bladder ca   Social History:  Married , step son  college in [place]  tobacco-- rarely has a cigar  ETOH-- wine socially  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 Diet-- healthy , problems w/portion control Exercise-- no recently    Review of Systems In general doing well. His dog was diagnosed with Lyme disease 2 months ago, he has been exposed to the same tics he was. Request a Lyme test. Denies dizziness shortness of breath No nausea, vomiting, diarrhea. Occasionally sees red, fresh, small amounts of blood in the toilet paper, thinks related to hemorrhoids. Denies any difficulty urinating or blood in the urine. denies depression or anxiety.     Objective:   Physical Exam  Constitutional: He is oriented to person, place, and time. He appears well-developed. No distress.  HENT:  Head: Normocephalic and atraumatic.  Neck: No thyromegaly present.  Cardiovascular: Normal rate, regular rhythm and normal heart sounds.   No murmur heard. Pulmonary/Chest: Effort normal and breath sounds normal. No respiratory distress. He has no wheezes.  Abdominal: Soft. Bowel sounds are normal. He exhibits no distension. There is no tenderness. There is no rebound and no guarding.  Genitourinary: Rectum normal and prostate normal.  Musculoskeletal:       DIABETIC FEET EXAM: No lower extremity edema Normal  pedal pulses bilaterally Skin and nails are normal without calluses Pinprick examination of the feet normal.   Neurological: He is alert and oriented to person, place, and time.  Skin: He is not diaphoretic.  Psychiatric: He has a normal mood and affect. His behavior is normal. Judgment and thought content normal.       Assessment & Plan:

## 2011-11-23 NOTE — Assessment & Plan Note (Addendum)
Td 2003 and today Last Pneumovax:  Pneumovax (08/26/2007) shingle shots 11-09 had a flu shot   colonoscopy 2000 , 11-2009 ----> next 2021  diet exercise discussed Labs, including a Lyme titer per pt request

## 2011-11-26 ENCOUNTER — Encounter: Payer: Self-pay | Admitting: Internal Medicine

## 2011-12-07 ENCOUNTER — Other Ambulatory Visit: Payer: Self-pay | Admitting: Internal Medicine

## 2011-12-07 NOTE — Telephone Encounter (Signed)
Refill done.  

## 2011-12-17 ENCOUNTER — Telehealth: Payer: Self-pay | Admitting: Internal Medicine

## 2011-12-17 DIAGNOSIS — H9190 Unspecified hearing loss, unspecified ear: Secondary | ICD-10-CM

## 2011-12-17 NOTE — Telephone Encounter (Signed)
Ordered referral.

## 2011-12-17 NOTE — Telephone Encounter (Signed)
Pt is calling for a referral to see and audiology. The last doctor he seem was Dr. Beckie Busing at the [facility] and the fax # [phone]

## 2011-12-17 NOTE — Telephone Encounter (Signed)
Ok to arrange the referral

## 2011-12-17 NOTE — Telephone Encounter (Signed)
Patient called to inform Hearing clinic phone is [phone], and fax is [phone].

## 2012-02-19 ENCOUNTER — Other Ambulatory Visit: Payer: Self-pay | Admitting: *Deleted

## 2012-02-19 MED ORDER — EZETIMIBE 10 MG PO TABS: ORAL_TABLET | ORAL | Status: DC

## 2012-02-19 MED ORDER — RAMIPRIL 5 MG PO CAPS: ORAL_CAPSULE | ORAL | Status: DC

## 2012-02-19 MED ORDER — NIACIN ER (ANTIHYPERLIPIDEMIC) 1000 MG PO TBCR: EXTENDED_RELEASE_TABLET | ORAL | Status: DC

## 2012-02-19 MED ORDER — ATORVASTATIN CALCIUM 40 MG PO TABS: ORAL_TABLET | ORAL | Status: DC

## 2012-02-19 NOTE — Telephone Encounter (Signed)
Left detail message Rx ready for pickup. 

## 2012-05-03 ENCOUNTER — Other Ambulatory Visit: Payer: Self-pay | Admitting: Internal Medicine

## 2012-05-23 ENCOUNTER — Ambulatory Visit (INDEPENDENT_AMBULATORY_CARE_PROVIDER_SITE_OTHER): Payer: Medicare Other | Admitting: Internal Medicine

## 2012-05-23 ENCOUNTER — Encounter: Payer: Self-pay | Admitting: Internal Medicine

## 2012-05-23 VITALS — BP 126/82 | HR 80 | Temp 98.2°F | Wt 251.0 lb

## 2012-05-23 DIAGNOSIS — E785 Hyperlipidemia, unspecified: Secondary | ICD-10-CM | POA: Diagnosis not present

## 2012-05-23 DIAGNOSIS — I2510 Atherosclerotic heart disease of native coronary artery without angina pectoris: Secondary | ICD-10-CM | POA: Diagnosis not present

## 2012-05-23 DIAGNOSIS — E119 Type 2 diabetes mellitus without complications: Secondary | ICD-10-CM

## 2012-05-23 LAB — CBC WITH DIFFERENTIAL/PLATELET
Basophils Relative: 1.2 % (ref 0.0–3.0)
Eosinophils Absolute: 0.2 10*3/uL (ref 0.0–0.7)
HCT: 41.5 % (ref 39.0–52.0)
Hemoglobin: 13.8 g/dL (ref 13.0–17.0)
Lymphocytes Relative: 33.8 % (ref 12.0–46.0)
Lymphs Abs: 1.7 10*3/uL (ref 0.7–4.0)
MCHC: 33.2 g/dL (ref 30.0–36.0)
MCV: 95 fl (ref 78.0–100.0)
Neutro Abs: 2.6 10*3/uL (ref 1.4–7.7)
RBC: 4.36 Mil/uL (ref 4.22–5.81)

## 2012-05-23 NOTE — Assessment & Plan Note (Addendum)
Diet has not been the best in the last few weeks, he was on vacation. Recommend to go back to a healthier diet, stay active. Check a A1c

## 2012-05-23 NOTE — Assessment & Plan Note (Signed)
Asymptomatic, doing well.  Last CBC show a slightly increased white blood cells. Recheck a CBC

## 2012-05-23 NOTE — Assessment & Plan Note (Signed)
Good medication compliance, last FLP, AST, ALT satisfactory. No change

## 2012-05-23 NOTE — Progress Notes (Signed)
  Subjective:    Patient ID: Harry Baker, male    DOB: 19-Apr-1946, 66 y.o.   MRN: [id number]  HPI Routine office visit Diabetes, has gained some weight in the last few months due to being on vacation. High cholesterol, good medication compliance.   Past Medical History:   Coronary artery disease, s/p stent, 6-08 (-) myoview, 8-11 negative stress test   Diabetes mellitus, type II   Hyperlipidemia   Diverticulosis, colon   hemorrhoid problems on off   h/o Vocal cord polyp   herniated disk- sees chiropractor-2010  HOH, has hearing aids  Past Surgical History:   Tonsillectomy   VC cyst removal   oral surgery-2010   Family History:   prostate ca--uncle   lung cancer-- bro   Brain tumor-- B colon ca-- great aunt   CHF-- M   MI-- GF Stroke-- F   f-deceased-83-stroke-bladder ca   Social History:   Married , step son   college in [place]   tobacco-- rarely has a cigar   ETOH-- wine socially   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   Review of Systems  no chest pain or shortness or breath No nausea, vomiting, diarrhea.     Objective:   Physical Exam General -- alert, well-developed, and overweight appearing. No apparent distress.  Lungs -- normal respiratory effort, no intercostal retractions, no accessory muscle use, and normal breath sounds.   Heart-- normal rate, regular rhythm, no murmur, and no gallop.   Psych-- Cognition and judgment appear intact. Alert and cooperative with normal attention span and concentration.  not anxious appearing and not depressed appearing.       Assessment & Plan:

## 2012-05-24 ENCOUNTER — Encounter: Payer: Self-pay | Admitting: *Deleted

## 2012-07-06 DIAGNOSIS — H25049 Posterior subcapsular polar age-related cataract, unspecified eye: Secondary | ICD-10-CM | POA: Diagnosis not present

## 2012-07-06 DIAGNOSIS — E119 Type 2 diabetes mellitus without complications: Secondary | ICD-10-CM | POA: Diagnosis not present

## 2012-07-06 DIAGNOSIS — H2510 Age-related nuclear cataract, unspecified eye: Secondary | ICD-10-CM | POA: Diagnosis not present

## 2012-07-06 DIAGNOSIS — H52229 Regular astigmatism, unspecified eye: Secondary | ICD-10-CM | POA: Diagnosis not present

## 2012-07-13 ENCOUNTER — Other Ambulatory Visit: Payer: Self-pay | Admitting: Internal Medicine

## 2012-07-13 NOTE — Telephone Encounter (Signed)
Refill done.  

## 2012-07-20 ENCOUNTER — Ambulatory Visit (INDEPENDENT_AMBULATORY_CARE_PROVIDER_SITE_OTHER): Payer: Medicare Other | Admitting: Cardiology

## 2012-07-20 ENCOUNTER — Encounter: Payer: Self-pay | Admitting: Cardiology

## 2012-07-20 VITALS — BP 118/80 | HR 82 | Ht 68.0 in | Wt 247.0 lb

## 2012-07-20 DIAGNOSIS — I2510 Atherosclerotic heart disease of native coronary artery without angina pectoris: Secondary | ICD-10-CM | POA: Diagnosis not present

## 2012-07-20 NOTE — Assessment & Plan Note (Signed)
Continue statin. Check lipids and liver. 

## 2012-07-20 NOTE — Patient Instructions (Addendum)
Your physician wants you to follow-up in: ONE YEAR WITH DR CRENSHAW IN [place] You will receive a reminder letter in the mail two months in advance. If you don't receive a letter, please call our office to schedule the follow-up appointment.  

## 2012-07-20 NOTE — Assessment & Plan Note (Signed)
Continue aspirin, ACE inhibitor and statin. Check potassium and renal function. Plan Myoview when he returns in one year.

## 2012-07-20 NOTE — Progress Notes (Signed)
HPI: [name] is a pleasant gentleman with a history of coronary artery disease with prior PCI of his right coronary artery in August 2000. He also had residual LAD disease at that time. His most recent Myoview in August of 2011 showed no ischemia or infarction and ejection fraction of 66%. He also had an abdominal ultrasound in July 2007 that showed no aneurysm. He has also had previous carotid Dopplers performed on September 24, 2005, which showed normal carotid arteries. I last saw him in Sept 2012. Since then he denies any dyspnea on exertion, orthopnea, PND, pedal edema, palpitations, syncope or chest pain.    Current Outpatient Prescriptions  Medication Sig Dispense Refill  . aspirin 325 MG tablet Take 325 mg by mouth daily.        Marland Kitchen atorvastatin (LIPITOR) 40 MG tablet TAKE 1 TABLET BY MOUTH ONCE DAILY  90 tablet  1  . co-enzyme Q-10 30 MG capsule Take 30 mg by mouth daily.       . fish oil-omega-3 fatty acids 1000 MG capsule Take 2 g by mouth daily.        . Multiple Vitamin (MULTIVITAMIN) capsule Take 1 capsule by mouth daily.        Marland Kitchen NIASPAN 1000 MG CR tablet TAKE 1 TABLET BY MOUTH ONCE DAILY  90 tablet  1  . nitroGLYCERIN (NITROSTAT) 0.4 MG SL tablet Place 0.4 mg under the tongue every 5 (five) minutes as needed.        Marland Kitchen omeprazole (PRILOSEC OTC) 20 MG tablet Take 20 mg by mouth daily.        . ramipril (ALTACE) 5 MG capsule TAKE ONE CAPSULE BY MOUTH ONCE DAILY  90 capsule  1  . ZETIA 10 MG tablet TAKE 1 TABLET BY MOUTH ONCE DAILY  90 tablet  1     Past Medical History  Diagnosis Date  . CAD (coronary artery disease)     s/p stent 6/08(-) myoview, 8/11 negative stress test  . Diabetes mellitus   . Hyperlipidemia   . Diverticulosis of colon   . Hemorrhoid     problems off and on  . Vocal cord polyp   . Herniated disc     sess chiropractor 2010  . GERD   . ERECTILE DYSFUNCTION     Past Surgical History  Procedure Date  . Tonsillectomy   . Vc cyst removal   . Mouth  surgery 2010    History   Social History  . Marital Status: Married    Spouse Name: N/A    Number of Children: N/A  . Years of Education: N/A   Occupational History  . Not on file.   Social History Main Topics  . Smoking status: Former Smoker    Types: Cigars  . Smokeless tobacco: Not on file  . Alcohol Use: Yes     wine socially  . Drug Use: Not on file  . Sexually Active: Not on file   Other Topics Concern  . Not on file   Social History Narrative   Diet- needs to go back to a healthier diet, and calorie countExercise- stays active     ROS: no fevers or chills, productive cough, hemoptysis, dysphasia, odynophagia, melena, hematochezia, dysuria, hematuria, rash, seizure activity, orthopnea, PND, pedal edema, claudication. Remaining systems are negative.  Physical Exam: Well-developed well-nourished in no acute distress.  Skin is warm and dry.  HEENT is normal.  Neck is supple.  Chest is clear to auscultation with  normal expansion.  Cardiovascular exam is regular rate and rhythm.  Abdominal exam nontender or distended. No masses palpated. Extremities show no edema. neuro grossly intact  ECG sinus rhythm at a rate of 82. No ST changes.

## 2012-07-21 LAB — BASIC METABOLIC PANEL WITH GFR
BUN: 17 mg/dL (ref 6–23)
Creat: 1.05 mg/dL (ref 0.50–1.35)
GFR, Est African American: 86 mL/min
GFR, Est Non African American: 74 mL/min
Glucose, Bld: 114 mg/dL — ABNORMAL HIGH (ref 70–99)

## 2012-08-11 ENCOUNTER — Encounter: Payer: Self-pay | Admitting: *Deleted

## 2012-08-15 ENCOUNTER — Telehealth: Payer: Self-pay | Admitting: Internal Medicine

## 2012-08-15 NOTE — Telephone Encounter (Signed)
Noted  

## 2012-08-15 NOTE — Telephone Encounter (Signed)
pt called to schedule flu & Pneumonia vacc. Made 2-wks apart-please advise if not ok an I will call patient back

## 2012-08-16 ENCOUNTER — Ambulatory Visit (INDEPENDENT_AMBULATORY_CARE_PROVIDER_SITE_OTHER): Payer: Medicare Other

## 2012-08-16 DIAGNOSIS — Z23 Encounter for immunization: Secondary | ICD-10-CM

## 2012-08-30 ENCOUNTER — Ambulatory Visit: Payer: Medicare Other

## 2012-09-01 ENCOUNTER — Ambulatory Visit (INDEPENDENT_AMBULATORY_CARE_PROVIDER_SITE_OTHER): Payer: Medicare Other | Admitting: *Deleted

## 2012-09-01 DIAGNOSIS — Z299 Encounter for prophylactic measures, unspecified: Secondary | ICD-10-CM | POA: Diagnosis not present

## 2012-09-01 DIAGNOSIS — Z23 Encounter for immunization: Secondary | ICD-10-CM

## 2012-11-23 ENCOUNTER — Encounter: Payer: Self-pay | Admitting: Internal Medicine

## 2012-11-23 ENCOUNTER — Ambulatory Visit (INDEPENDENT_AMBULATORY_CARE_PROVIDER_SITE_OTHER): Payer: Medicare Other | Admitting: Internal Medicine

## 2012-11-23 VITALS — BP 114/72 | HR 73 | Temp 98.1°F | Ht 68.5 in | Wt 243.0 lb

## 2012-11-23 DIAGNOSIS — E119 Type 2 diabetes mellitus without complications: Secondary | ICD-10-CM

## 2012-11-23 DIAGNOSIS — E785 Hyperlipidemia, unspecified: Secondary | ICD-10-CM

## 2012-11-23 DIAGNOSIS — I2510 Atherosclerotic heart disease of native coronary artery without angina pectoris: Secondary | ICD-10-CM | POA: Diagnosis not present

## 2012-11-23 DIAGNOSIS — Z0000 Encounter for general adult medical examination without abnormal findings: Secondary | ICD-10-CM

## 2012-11-23 LAB — LIPID PANEL
Cholesterol: 97 mg/dL (ref 0–200)
HDL: 36.7 mg/dL — ABNORMAL LOW (ref >39.00)
LDL Cholesterol: 48 mg/dL (ref 0–99)
VLDL: 12.8 mg/dL (ref 0.0–40.0)

## 2012-11-23 MED ORDER — NITROGLYCERIN 0.4 MG SL SUBL: 0.4000 mg | SUBLINGUAL_TABLET | SUBLINGUAL | Status: DC | PRN

## 2012-11-23 NOTE — Assessment & Plan Note (Addendum)
Td 2013 Last Pneumovax 2013 shingle shots 11-09  colonoscopy 2000 , 11-2009 ----> next 2021  Prostate cancer screening: DRE last year normal, PSAs have been consistently normal, plan---->  recheck next year.  He has occasional difficulty urinating, patient will let me know if that becomes a problem. diet exercise discussed

## 2012-11-23 NOTE — Assessment & Plan Note (Signed)
Due for labs, continue current meds

## 2012-11-23 NOTE — Patient Instructions (Signed)

## 2012-11-23 NOTE — Assessment & Plan Note (Signed)
Asymptomatic, refill nitroglycerin just in case

## 2012-11-23 NOTE — Assessment & Plan Note (Addendum)
On diet control only, where check the A1c. BMI is high, diet, exercise and feet care  discussed

## 2012-11-23 NOTE — Progress Notes (Signed)
  Subjective:    Patient ID: Harry Baker, male    DOB: [birth year], 67 y.o.   MRN: [id number]  HPI  Here for Medicare AWV: 1. Risk factors based on Past M, S, F history: reviewed 2. Physical Activities: active , has a farm  3. Depression/mood:  (-) screening  4. Hearing: poor hearing, has a hearing aid implant L   5. ADL's:  Independent  6. Fall Risk: low risk, see instructions  7. home Safety: does feelsafe at home  8. Height, weight, &visual acuity: see VS, vision corrected, sees eye doctor yearly   9. Counseling: provided 10. Labs ordered based on risk factors: if needed  11. Referral Coordination: if needed 12.  Care Plan, see assessment and plan  13.   Cognitive Assessment: Cognition intact, motor skills normal.  In addition, today we discussed the following: Diabetes, on diet control only, gallbladder CBGs. CAD, good medication compliance, has nitroglycerin but has not used it. High cholesterol, good medication compliance without apparent side effects. GERD symptoms are well controlled with PPIs   Past Medical History:   Coronary artery disease, s/p stent, 6-08 (-) myoview, 8-11 negative stress test   Diabetes mellitus, type II   Hyperlipidemia   Diverticulosis, colon   hemorrhoid problems on off   h/o Vocal cord polyp   herniated disk- sees chiropractor-2010  HOH, has hearing aids  Past Surgical History:   Tonsillectomy   VC cyst removal   oral surgery-2010   Family History:   prostate ca--uncle   lung cancer-- bro , brain mets  scolon ca-- great aunt  dx in her 72s CHF-- M   MI-- MGF MI Stroke-- F   DM-- no f-deceased-83-stroke-bladder ca   Social History:   Married , step son    tobacco-- rarely has a cigar   ETOH-- wine socially   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  Review of Systems Denies recent chest pain, shortness of breath or lower extremity edema. No nausea, vomiting, diarrhea or blood in the stools. Occasional difficulty with urination, this  is not a consistent symptom. Denies urinary frequency or blood in the urine.    Objective:   Physical Exam General -- alert, well-developed, BMI 36.   Neck --no thyromegaly , normal carotid pulse Lungs -- normal respiratory effort, no intercostal retractions, no accessory muscle use, and normal breath sounds.   Heart-- normal rate, regular rhythm, no murmur, and no gallop.   Abdomen--soft, non-tender, no distention, no masses, no HSM, no guarding, and no rigidity.   Extremities-- no pretibial edema bilaterally Neurologic-- alert & oriented X3 and strength normal in all extremities. Psych-- Cognition and judgment appear intact. Alert and cooperative with normal attention span and concentration.  not anxious appearing and not depressed appearing.       Assessment & Plan:

## 2012-11-28 ENCOUNTER — Encounter: Payer: Self-pay | Admitting: *Deleted

## 2012-12-04 ENCOUNTER — Other Ambulatory Visit: Payer: Self-pay | Admitting: Internal Medicine

## 2012-12-05 NOTE — Telephone Encounter (Signed)
Refill done.  

## 2013-02-13 ENCOUNTER — Other Ambulatory Visit: Payer: Self-pay | Admitting: Internal Medicine

## 2013-02-14 NOTE — Telephone Encounter (Signed)
Refill done.  

## 2013-04-07 ENCOUNTER — Ambulatory Visit (INDEPENDENT_AMBULATORY_CARE_PROVIDER_SITE_OTHER): Payer: Medicare Other | Admitting: Internal Medicine

## 2013-04-07 ENCOUNTER — Encounter: Payer: Self-pay | Admitting: Internal Medicine

## 2013-04-07 VITALS — BP 120/90 | HR 96 | Temp 98.1°F | Wt 241.4 lb

## 2013-04-07 DIAGNOSIS — K219 Gastro-esophageal reflux disease without esophagitis: Secondary | ICD-10-CM | POA: Diagnosis not present

## 2013-04-07 DIAGNOSIS — E119 Type 2 diabetes mellitus without complications: Secondary | ICD-10-CM | POA: Diagnosis not present

## 2013-04-07 LAB — CBC WITH DIFFERENTIAL/PLATELET
Basophils Relative: 1.2 % (ref 0.0–3.0)
Eosinophils Absolute: 0.3 10*3/uL (ref 0.0–0.7)
Eosinophils Relative: 4.3 % (ref 0.0–5.0)
Lymphocytes Relative: 32.1 % (ref 12.0–46.0)
MCHC: 34.8 g/dL (ref 30.0–36.0)
MCV: 93.3 fl (ref 78.0–100.0)
Monocytes Absolute: 0.8 10*3/uL (ref 0.1–1.0)
Neutrophils Relative %: 52 % (ref 43.0–77.0)
Platelets: 265 10*3/uL (ref 150.0–400.0)
RBC: 4.73 Mil/uL (ref 4.22–5.81)
WBC: 7.4 10*3/uL (ref 4.5–10.5)

## 2013-04-07 LAB — BASIC METABOLIC PANEL
BUN: 19 mg/dL (ref 6–23)
CO2: 25 mEq/L (ref 19–32)
Calcium: 9.7 mg/dL (ref 8.4–10.5)
Creatinine, Ser: 1 mg/dL (ref 0.4–1.5)
Potassium: 4.1 mEq/L (ref 3.5–5.1)
Sodium: 138 mEq/L (ref 135–145)

## 2013-04-07 NOTE — Assessment & Plan Note (Signed)
On Prilosec for years, was started for cough rather than GERD. Recommend to see how he does without PPIs.

## 2013-04-07 NOTE — Assessment & Plan Note (Addendum)
Well-controlled over time, on diet only.   BP slightly elevated today but usually very good. No ambulatory BPs.Will recheck on return to the office. Check a BMP and CBC

## 2013-04-07 NOTE — Progress Notes (Signed)
  Subjective:    Patient ID: Harry Baker, male    DOB: 1946/07/06, 67 y.o.   MRN: [id number]  HPI Routine office visit Good compliance with medications except for Niaspan, [place] has been [place] and it was difficult for him to take it. Plans to go back. No  ambulatory BPs or blood sugars. Concerned about tick bites, see below.  Past Medical History:   Coronary artery disease, s/p stent, 6-08 (-) myoview, 8-11 negative stress test   Diabetes mellitus, type II   Hyperlipidemia   Diverticulosis, colon   hemorrhoid problems on off   h/o Vocal cord polyp   herniated disk- sees chiropractor-2010  HOH, has hearing aids  Past Surgical History:   Tonsillectomy   VC cyst removal   oral surgery-2010   Family History:   prostate ca--uncle   lung cancer-- bro , brain mets  scolon ca-- great aunt  dx in her 64s CHF-- M   MI-- MGF MI Stroke-- F   DM-- no f-deceased-83-stroke-bladder ca   Social History:   Married , step son    tobacco-- rarely has a cigar   ETOH-- wine socially   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   Review of Systems No chest pain or shortness or breath No nausea, vomiting, diarrhea.     Objective:   Physical Exam BP 120/90  Pulse 96  Temp(Src) 98.1 F (36.7 C) (Oral)  Wt 241 lb 6.4 oz (109.498 kg)  BMI 36.17 kg/m2  SpO2 93%  General -- alert, well-developed, Nad Lungs -- normal respiratory effort, no intercostal retractions, no accessory muscle use, and normal breath sounds.   Heart-- normal rate, regular rhythm, no murmur, and no gallop.   Extremities-- no pretibial edema bilaterally psych-- Cognition and judgment appear intact. Alert and cooperative with normal attention span and concentration.  not anxious appearing and not depressed appearing.       Assessment & Plan:   Tick bites He likes the outdoors, has been bite by ticks several times in the last few years, wonders if he needs to be tested. I recommend prevention, I also gave him  literature about Lyme disease, if he has any symptoms or any rash after a tick bite he needs to be not only tested but possibly treated as well.

## 2013-04-07 NOTE — Patient Instructions (Addendum)
Next visit in 4 to 5 months

## 2013-04-08 ENCOUNTER — Encounter: Payer: Self-pay | Admitting: Internal Medicine

## 2013-04-10 ENCOUNTER — Encounter: Payer: Self-pay | Admitting: *Deleted

## 2013-04-11 ENCOUNTER — Ambulatory Visit: Payer: Medicare Other | Admitting: Internal Medicine

## 2013-04-14 ENCOUNTER — Ambulatory Visit: Payer: Medicare Other | Admitting: Internal Medicine

## 2013-04-18 ENCOUNTER — Telehealth: Payer: Self-pay | Admitting: Cardiology

## 2013-04-18 DIAGNOSIS — L255 Unspecified contact dermatitis due to plants, except food: Secondary | ICD-10-CM | POA: Diagnosis not present

## 2013-04-18 NOTE — Telephone Encounter (Signed)
New Prob     Pt has some questions regarding cardiolite test. Please call.

## 2013-04-18 NOTE — Telephone Encounter (Signed)
Spoke with pt wife, pt wonders if he needs a nuclear test this year. He is due to see dr Jens Som in oct and will be decided if one is needed at that time. If pt wants to do anything different he will call back.

## 2013-04-25 ENCOUNTER — Telehealth: Payer: Self-pay | Admitting: Cardiology

## 2013-04-25 ENCOUNTER — Other Ambulatory Visit: Payer: Self-pay | Admitting: Internal Medicine

## 2013-04-25 DIAGNOSIS — I2510 Atherosclerotic heart disease of native coronary artery without angina pectoris: Secondary | ICD-10-CM

## 2013-04-25 NOTE — Telephone Encounter (Signed)
Left message for pt to call.

## 2013-04-25 NOTE — Telephone Encounter (Signed)
Yes in August 2014 as outlined in last office note Olga Millers

## 2013-04-25 NOTE — Telephone Encounter (Signed)
Refill done.  

## 2013-04-25 NOTE — Telephone Encounter (Signed)
Spoke with pt, he wants to know if he needs a nuclear stress test this year. It has been 3 years since his last. He is due to see dr Jens Som in oct. Will forward for dr Jens Som review

## 2013-04-25 NOTE — Telephone Encounter (Signed)
Spoke with pt, Aware of dr Ludwig Clarks recommendations. myoview scheduled

## 2013-04-25 NOTE — Telephone Encounter (Signed)
New Problem  Pt wants to speak with you regarding a nuclear test.

## 2013-06-05 DIAGNOSIS — D235 Other benign neoplasm of skin of trunk: Secondary | ICD-10-CM | POA: Diagnosis not present

## 2013-06-05 DIAGNOSIS — D1801 Hemangioma of skin and subcutaneous tissue: Secondary | ICD-10-CM | POA: Diagnosis not present

## 2013-06-06 ENCOUNTER — Ambulatory Visit (HOSPITAL_COMMUNITY): Payer: Medicare Other | Attending: Cardiology | Admitting: Radiology

## 2013-06-06 VITALS — BP 93/64 | Ht 68.0 in | Wt 248.0 lb

## 2013-06-06 DIAGNOSIS — Z87891 Personal history of nicotine dependence: Secondary | ICD-10-CM | POA: Insufficient documentation

## 2013-06-06 DIAGNOSIS — E785 Hyperlipidemia, unspecified: Secondary | ICD-10-CM | POA: Diagnosis not present

## 2013-06-06 DIAGNOSIS — I2510 Atherosclerotic heart disease of native coronary artery without angina pectoris: Secondary | ICD-10-CM

## 2013-06-06 DIAGNOSIS — R0989 Other specified symptoms and signs involving the circulatory and respiratory systems: Secondary | ICD-10-CM | POA: Insufficient documentation

## 2013-06-06 DIAGNOSIS — R0609 Other forms of dyspnea: Secondary | ICD-10-CM | POA: Insufficient documentation

## 2013-06-06 DIAGNOSIS — R5381 Other malaise: Secondary | ICD-10-CM | POA: Diagnosis not present

## 2013-06-06 DIAGNOSIS — I4949 Other premature depolarization: Secondary | ICD-10-CM | POA: Diagnosis not present

## 2013-06-06 MED ORDER — TECHNETIUM TC 99M SESTAMIBI GENERIC - CARDIOLITE
10.0000 | Freq: Once | INTRAVENOUS | Status: AC | PRN
  Administered 2013-06-06: 10 via INTRAVENOUS

## 2013-06-06 MED ORDER — TECHNETIUM TC 99M SESTAMIBI GENERIC - CARDIOLITE
30.0000 | Freq: Once | INTRAVENOUS | Status: AC | PRN
  Administered 2013-06-06: 30 via INTRAVENOUS

## 2013-06-06 NOTE — Progress Notes (Signed)
[facility] [place] SITE 3 NUCLEAR MED [address], [place] [address] [phone]    Cardiology Nuclear Med Study  Harry Baker is a 67 y.o. male     MRN : [id number]     DOB: 1946/07/24  Procedure Date: 06/06/2013  Nuclear Med Background Indication for Stress Test:  Evaluation for Ischemia and Stent Patency History:  '00 Heart Cath:-Stent- RCA, '11 MPS:(-) ischemia EF: 66%  Cardiac Risk Factors: History of Smoking and Lipids  Symptoms:  DOE and Fatigue   Nuclear Pre-Procedure Caffeine/Decaff Intake:  None > 12 hrs NPO After: 7:00am   Lungs:  clear O2 Sat: 97% on room air. IV 0.9% NS with Angio Cath:  20g  IV Site: L Antecubital x 1, tolerated well IV Started by:  Irean Hong, RN  Chest Size (in):  48 Cup Size: n/a  Height: 5\' 8"  (1.727 m)  Weight:  248 lb (112.492 kg)  BMI:  Body mass index is 37.72 kg/(m^2). Tech Comments:  Altace this am    Nuclear Med Study 1 or 2 day study: 1 day  Stress Test Type:  Stress  Reading MD: Cassell Clement, MD  Order Authorizing Provider:  Olga Millers, MD  Resting Radionuclide: Technetium 46m Sestamibi  Resting Radionuclide Dose: 11.0 mCi   Stress Radionuclide:  Technetium 22m Sestamibi  Stress Radionuclide Dose: 33.0 mCi           Stress Protocol Rest HR: 72 Stress HR: 141  Rest BP: 93/64 Stress BP: 153/79  Exercise Time (min): 7:00 METS: 8.50   Predicted Max HR: 154 bpm % Max HR: 91.56 bpm Rate Pressure Product: 01027   Dose of Adenosine (mg):  n/a Dose of Lexiscan: n/a mg  Dose of Atropine (mg): n/a Dose of Dobutamine: n/a mcg/kg/min (at max HR)  Stress Test Technologist: Milana Na, EMT-P  Nuclear Technologist:  Domenic Polite, CNMT     Rest Procedure:  Myocardial perfusion imaging was performed at rest 45 minutes following the intravenous administration of Technetium 55m Sestamibi. Rest ECG: NSR - Normal EKG  Stress Procedure:  The patient exercised on the treadmill utilizing the Bruce Protocol  for 7:00 minutes. The patient stopped due to fatigue and denied any chest pain.  Technetium 78m Sestamibi was injected at peak exercise and myocardial perfusion imaging was performed after a brief delay. Stress ECG: No significant change from baseline ECG  QPS Raw Data Images:  Normal; no motion artifact; normal heart/lung ratio. Stress Images:  Normal homogeneous uptake in all areas of the myocardium. Rest Images:  Normal homogeneous uptake in all areas of the myocardium. Subtraction (SDS):  No evidence of ischemia. Transient Ischemic Dilatation (Normal <1.22):  n/a Lung/Heart Ratio (Normal <0.45):  0.35  Quantitative Gated Spect Images QGS EDV:  85 ml QGS ESV:  33 ml  Impression Exercise Capacity:  Good exercise capacity. BP Response:  Normal blood pressure response. Clinical Symptoms:  No significant symptoms noted. ECG Impression:  No significant ST segment change suggestive of ischemia. Comparison with Prior Nuclear Study: No significant change from previous study  Overall Impression:  Normal stress nuclear study.  LV Ejection Fraction: 62%.  LV Wall Motion:  NL LV Function; NL Wall Motion   [employer]

## 2013-07-03 DIAGNOSIS — L820 Inflamed seborrheic keratosis: Secondary | ICD-10-CM | POA: Diagnosis not present

## 2013-07-03 DIAGNOSIS — D485 Neoplasm of uncertain behavior of skin: Secondary | ICD-10-CM | POA: Diagnosis not present

## 2013-07-12 DIAGNOSIS — H31099 Other chorioretinal scars, unspecified eye: Secondary | ICD-10-CM | POA: Diagnosis not present

## 2013-07-12 DIAGNOSIS — H524 Presbyopia: Secondary | ICD-10-CM | POA: Diagnosis not present

## 2013-07-12 DIAGNOSIS — H2510 Age-related nuclear cataract, unspecified eye: Secondary | ICD-10-CM | POA: Diagnosis not present

## 2013-07-12 DIAGNOSIS — E119 Type 2 diabetes mellitus without complications: Secondary | ICD-10-CM | POA: Diagnosis not present

## 2013-07-12 DIAGNOSIS — H25049 Posterior subcapsular polar age-related cataract, unspecified eye: Secondary | ICD-10-CM | POA: Diagnosis not present

## 2013-07-31 DIAGNOSIS — Z23 Encounter for immunization: Secondary | ICD-10-CM | POA: Diagnosis not present

## 2013-08-02 ENCOUNTER — Encounter: Payer: Self-pay | Admitting: Cardiology

## 2013-08-02 ENCOUNTER — Ambulatory Visit (INDEPENDENT_AMBULATORY_CARE_PROVIDER_SITE_OTHER): Payer: Medicare Other | Admitting: Cardiology

## 2013-08-02 VITALS — BP 124/80 | HR 93 | Ht 68.0 in | Wt 253.0 lb

## 2013-08-02 DIAGNOSIS — I2510 Atherosclerotic heart disease of native coronary artery without angina pectoris: Secondary | ICD-10-CM | POA: Diagnosis not present

## 2013-08-02 DIAGNOSIS — E785 Hyperlipidemia, unspecified: Secondary | ICD-10-CM

## 2013-08-02 NOTE — Assessment & Plan Note (Signed)
Continue statin. 

## 2013-08-02 NOTE — Assessment & Plan Note (Signed)
Continue aspirin and statin. 

## 2013-08-02 NOTE — Patient Instructions (Signed)
Your physician wants you to follow-up in: ONE YEAR WITH DR CRENSHAW IN [place] You will receive a reminder letter in the mail two months in advance. If you don't receive a letter, please call our office to schedule the follow-up appointment.  

## 2013-08-02 NOTE — Progress Notes (Signed)
HPI: [name] is a pleasant gentleman with a history of coronary artery disease with prior PCI of his right coronary artery in August 2000. He also had residual LAD disease at that time. He had an abdominal ultrasound in July 2007 that showed no aneurysm. He has also had previous carotid Dopplers performed on September 24, 2005, which showed normal carotid arteries. His most recent Myoview in August of 2014 showed no ischemia or infarction and ejection fraction of 62%. I last saw him in Oct 2013. Since then he denies any dyspnea on exertion, orthopnea, PND, pedal edema, palpitations, syncope or chest pain.    Current Outpatient Prescriptions  Medication Sig Dispense Refill  . aspirin 325 MG tablet Take 325 mg by mouth daily.        Marland Kitchen atorvastatin (LIPITOR) 40 MG tablet TAKE 1 TABLET BY MOUTH DAILY  90 tablet  1  . co-enzyme Q-10 30 MG capsule Take 30 mg by mouth daily.       . fish oil-omega-3 fatty acids 1000 MG capsule Take 4 g by mouth daily.       Marland Kitchen GLUCOSAMINE-CHONDROITIN PO Take 1 tablet by mouth daily.      . Multiple Vitamin (MULTIVITAMIN) capsule Take 1 capsule by mouth daily.        . niacin (NIASPAN) 1000 MG CR tablet TAKE 1 TABLET BY MOUTH ONCE DAILY  90 tablet  1  . nitroGLYCERIN (NITROSTAT) 0.4 MG SL tablet Place 1 tablet (0.4 mg total) under the tongue every 5 (five) minutes as needed.  30 tablet  2  . omeprazole (PRILOSEC OTC) 20 MG tablet Take 20 mg by mouth daily.        . ramipril (ALTACE) 5 MG capsule TAKE ONE CAPSULE BY MOUTH ONCE DAILY  90 capsule  1  . ZETIA 10 MG tablet TAKE 1 TABLET BY MOUTH DAILY  90 tablet  1   No current facility-administered medications for this visit.     Past Medical History  Diagnosis Date  . CAD (coronary artery disease)     s/p stent 6/08(-) myoview, 8/11 negative stress test  . Diabetes mellitus   . Hyperlipidemia   . Diverticulosis of colon   . Hemorrhoid     problems off and on  . Vocal cord polyp   . Herniated disc    sess chiropractor 2010  . GERD   . ERECTILE DYSFUNCTION     Past Surgical History  Procedure Laterality Date  . Tonsillectomy    . Vc cyst removal    . Mouth surgery  2010    History   Social History  . Marital Status: Married    Spouse Name: N/A    Number of Children: N/A  . Years of Education: N/A   Occupational History  . Not on file.   Social History Main Topics  . Smoking status: Former Smoker    Types: Cigars  . Smokeless tobacco: Not on file  . Alcohol Use: Yes     Comment: wine socially  . Drug Use: Not on file  . Sexual Activity: Not on file   Other Topics Concern  . Not on file   Social History Narrative   Diet- needs to go back to a healthier diet, and calorie count   Exercise- stays active           ROS: no fevers or chills, productive cough, hemoptysis, dysphasia, odynophagia, melena, hematochezia, dysuria, hematuria, rash, seizure activity, orthopnea, PND,  pedal edema, claudication. Remaining systems are negative.  Physical Exam: Well-developed obese in no acute distress.  Skin is warm and dry.  HEENT is normal.  Neck is supple.  Chest is clear to auscultation with normal expansion.  Cardiovascular exam is regular rate and rhythm.  Abdominal exam nontender or distended. No masses palpated. Extremities show no edema. neuro grossly intact  ECG sinus rhythm at a rate of 93. No significant ST changes.

## 2013-08-14 ENCOUNTER — Ambulatory Visit (INDEPENDENT_AMBULATORY_CARE_PROVIDER_SITE_OTHER): Payer: Medicare Other | Admitting: Internal Medicine

## 2013-08-14 ENCOUNTER — Encounter: Payer: Self-pay | Admitting: Internal Medicine

## 2013-08-14 VITALS — BP 114/70 | HR 74 | Temp 98.3°F | Wt 251.0 lb

## 2013-08-14 DIAGNOSIS — I2510 Atherosclerotic heart disease of native coronary artery without angina pectoris: Secondary | ICD-10-CM | POA: Diagnosis not present

## 2013-08-14 DIAGNOSIS — E785 Hyperlipidemia, unspecified: Secondary | ICD-10-CM | POA: Diagnosis not present

## 2013-08-14 DIAGNOSIS — E119 Type 2 diabetes mellitus without complications: Secondary | ICD-10-CM | POA: Diagnosis not present

## 2013-08-14 LAB — HEMOGLOBIN A1C: Hgb A1c MFr Bld: 7.3 % — ABNORMAL HIGH (ref 4.6–6.5)

## 2013-08-14 LAB — ALT: ALT: 24 U/L (ref 0–53)

## 2013-08-14 NOTE — Assessment & Plan Note (Signed)
Stress test 05-2013 negative, no change on medications

## 2013-08-14 NOTE — Progress Notes (Signed)
  Subjective:    Patient ID: Harry Baker, male    DOB: [birth year], 67 y.o.   MRN: [id number]  HPI Routine office visit Good medication compliance, no apparent side effects. Got a flu shot already. Recent stress test negative. amb BPs wnl  Past Medical History  Diagnosis Date  . CAD (coronary artery disease)     s/p stent 6/08(-) myoview, 8/11 negative stress test  . Diabetes mellitus   . Hyperlipidemia   . Diverticulosis of colon   . Hemorrhoid     problems off and on  . Vocal cord polyp   . Herniated disc     sess chiropractor 2010  . GERD   . ERECTILE DYSFUNCTION   . HOH (hard of hearing)    Past Surgical History  Procedure Laterality Date  . Tonsillectomy    . Vc cyst removal    . Mouth surgery  2010   History   Social History  . Marital Status: Married    Spouse Name: N/A    Number of Children: 1  . Years of Education: N/A   Occupational History  . COMPUTER WORK    Social History Main Topics  . Smoking status: Light Tobacco Smoker    Types: Cigars  . Smokeless tobacco: Never Used     Comment: occ cigar  . Alcohol Use: Yes     Comment: wine socially  . Drug Use: No  . Sexual Activity: Not on file   Other Topics Concern  . 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   Lungs -- normal respiratory effort, no intercostal retractions, no accessory muscle use, and normal breath sounds.  Heart-- normal rate, regular rhythm, no murmur.   Extremities-- no pretibial edema bilaterally  Neurologic--  alert & oriented X3. Speech normal, gait normal, strength normal in all extremities.   Psych-- Cognition and judgment appear intact. Cooperative with normal attention span and concentration. No anxious appearing , no depressed appearing.     Assessment & Plan:

## 2013-08-14 NOTE — Assessment & Plan Note (Signed)
Currently on no medication, last A1c less than 7. Discuss with patient goals, keeping A1c close to 6.5. Diet and exercise encouraged Labs, Consider medication if A1c> 7.

## 2013-08-14 NOTE — Assessment & Plan Note (Signed)
Check LFTs 

## 2013-08-14 NOTE — Patient Instructions (Signed)
Get your blood work before you leave  Next visit in 4 months  for a physical exam. Fasting Please make an appointment    

## 2013-08-15 ENCOUNTER — Telehealth: Payer: Self-pay | Admitting: *Deleted

## 2013-08-15 DIAGNOSIS — E119 Type 2 diabetes mellitus without complications: Secondary | ICD-10-CM

## 2013-08-15 MED ORDER — METFORMIN HCL 850 MG PO TABS
850.0000 mg | ORAL_TABLET | Freq: Two times a day (BID) | ORAL | Status: DC
Start: 2013-08-15 — End: 2013-10-26

## 2013-08-15 NOTE — Telephone Encounter (Signed)
Message copied by Eustace Quail on Tue Aug 15, 2013  1:53 PM ------      Message from: Willow Ora E      Created: Tue Aug 15, 2013  1:33 PM       Advise patient,      Diabetes needs better control, I recommend to start metformin 850 mg one tablet twice a day with food, #60, 3 RF      For the first week, I recommend to take only one metformin tablet with breakfast so he gets used to it.      Side effects are sometimes nausea and diarrhea, patient to call me if side effects are severe.      Otherwise arrange labs to be done 2 months from today: A1c, CMP--- dx  diabetes ------

## 2013-08-15 NOTE — Telephone Encounter (Signed)
Labs and rx ordered. DJR

## 2013-09-18 ENCOUNTER — Other Ambulatory Visit: Payer: Self-pay | Admitting: Internal Medicine

## 2013-09-18 NOTE — Telephone Encounter (Signed)
Atorvastatin, Niacin, Ramipril, and Zetia refilled per protocol

## 2013-10-24 ENCOUNTER — Other Ambulatory Visit (INDEPENDENT_AMBULATORY_CARE_PROVIDER_SITE_OTHER): Payer: Medicare Other

## 2013-10-24 DIAGNOSIS — E119 Type 2 diabetes mellitus without complications: Secondary | ICD-10-CM

## 2013-10-24 LAB — COMPREHENSIVE METABOLIC PANEL
ALK PHOS: 64 U/L (ref 39–117)
ALT: 26 U/L (ref 0–53)
AST: 28 U/L (ref 0–37)
Albumin: 4.3 g/dL (ref 3.5–5.2)
BUN: 13 mg/dL (ref 6–23)
CO2: 26 meq/L (ref 19–32)
Calcium: 9.1 mg/dL (ref 8.4–10.5)
Chloride: 102 mEq/L (ref 96–112)
Creatinine, Ser: 1 mg/dL (ref 0.4–1.5)
GFR: 81.14 mL/min (ref >60.00)
GLUCOSE: 128 mg/dL — AB (ref 70–99)
POTASSIUM: 4.1 meq/L (ref 3.5–5.1)
SODIUM: 136 meq/L (ref 135–145)
TOTAL PROTEIN: 7.2 g/dL (ref 6.0–8.3)
Total Bilirubin: 0.8 mg/dL (ref 0.3–1.2)

## 2013-10-24 LAB — HEMOGLOBIN A1C: Hgb A1c MFr Bld: 7.1 % — ABNORMAL HIGH (ref 4.6–6.5)

## 2013-10-26 ENCOUNTER — Telehealth: Payer: Self-pay | Admitting: Internal Medicine

## 2013-10-26 DIAGNOSIS — E119 Type 2 diabetes mellitus without complications: Secondary | ICD-10-CM

## 2013-10-26 MED ORDER — SITAGLIPTIN PHOS-METFORMIN HCL 50-1000 MG PO TABS: 1.0000 | ORAL_TABLET | Freq: Two times a day (BID) | ORAL | Status: DC

## 2013-10-26 NOTE — Addendum Note (Signed)
Addended by: Peggyann Shoals on: 10/26/2013 01:49 PM   Modules accepted: Orders

## 2013-10-26 NOTE — Telephone Encounter (Signed)
Pt scheduled for tomorrow for labs

## 2013-10-26 NOTE — Telephone Encounter (Signed)
Patient is due for labs, CMP and hemoglobin A1c. Please arrange

## 2013-10-27 ENCOUNTER — Other Ambulatory Visit: Payer: Medicare Other

## 2013-10-27 DIAGNOSIS — E119 Type 2 diabetes mellitus without complications: Secondary | ICD-10-CM | POA: Diagnosis not present

## 2013-10-27 LAB — COMPREHENSIVE METABOLIC PANEL
ALBUMIN: 4.1 g/dL (ref 3.5–5.2)
ALT: 28 U/L (ref 0–53)
AST: 33 U/L (ref 0–37)
Alkaline Phosphatase: 60 U/L (ref 39–117)
BUN: 20 mg/dL (ref 6–23)
CO2: 26 mEq/L (ref 19–32)
Calcium: 9.3 mg/dL (ref 8.4–10.5)
Chloride: 106 mEq/L (ref 96–112)
Creatinine, Ser: 1 mg/dL (ref 0.4–1.5)
GFR: 79.26 mL/min (ref >60.00)
Glucose, Bld: 120 mg/dL — ABNORMAL HIGH (ref 70–99)
POTASSIUM: 3.7 meq/L (ref 3.5–5.1)
Sodium: 139 mEq/L (ref 135–145)
Total Bilirubin: 0.7 mg/dL (ref 0.3–1.2)
Total Protein: 6.9 g/dL (ref 6.0–8.3)

## 2013-10-27 LAB — HEMOGLOBIN A1C: HEMOGLOBIN A1C: 7 % — AB (ref 4.6–6.5)

## 2013-11-28 ENCOUNTER — Telehealth: Payer: Self-pay

## 2013-11-28 NOTE — Telephone Encounter (Signed)
Medication List and allergies:  Reviewed and updated  90 day supply/mail order: na Local prescriptions: [facility] [address]  Immunizations due: UTD  A/P:   No changes to FH, PSH or Personal Hx Flu vaccine--07/2013 Tdap--11/2011 PNA--08/2012 Shingles--08/2008 CCS--11/2009--Dr Kaplan--next 2021 PSA--11/23/2011--0.88  To Discuss with Provider: Jan Fireman on Weight Loss Could Niacin be substituted? Very costly.

## 2013-11-29 ENCOUNTER — Ambulatory Visit (INDEPENDENT_AMBULATORY_CARE_PROVIDER_SITE_OTHER): Payer: Medicare Other | Admitting: Internal Medicine

## 2013-11-29 VITALS — BP 113/75 | HR 86 | Temp 98.0°F | Ht 68.2 in | Wt 236.0 lb

## 2013-11-29 DIAGNOSIS — Z129 Encounter for screening for malignant neoplasm, site unspecified: Secondary | ICD-10-CM

## 2013-11-29 DIAGNOSIS — R399 Unspecified symptoms and signs involving the genitourinary system: Secondary | ICD-10-CM

## 2013-11-29 DIAGNOSIS — E785 Hyperlipidemia, unspecified: Secondary | ICD-10-CM

## 2013-11-29 DIAGNOSIS — E119 Type 2 diabetes mellitus without complications: Secondary | ICD-10-CM | POA: Diagnosis not present

## 2013-11-29 DIAGNOSIS — K219 Gastro-esophageal reflux disease without esophagitis: Secondary | ICD-10-CM

## 2013-11-29 DIAGNOSIS — Z125 Encounter for screening for malignant neoplasm of prostate: Secondary | ICD-10-CM

## 2013-11-29 DIAGNOSIS — Z0000 Encounter for general adult medical examination without abnormal findings: Secondary | ICD-10-CM | POA: Diagnosis not present

## 2013-11-29 DIAGNOSIS — I2510 Atherosclerotic heart disease of native coronary artery without angina pectoris: Secondary | ICD-10-CM | POA: Diagnosis not present

## 2013-11-29 LAB — LIPID PANEL
CHOL/HDL RATIO: 3
Cholesterol: 105 mg/dL (ref 0–200)
HDL: 38.9 mg/dL — AB (ref >39.00)
LDL CALC: 52 mg/dL (ref 0–99)
TRIGLYCERIDES: 71 mg/dL (ref 0.0–149.0)
VLDL: 14.2 mg/dL (ref 0.0–40.0)

## 2013-11-29 LAB — PSA: PSA: 1.05 ng/mL (ref 0.10–4.00)

## 2013-11-29 NOTE — Assessment & Plan Note (Signed)
Td 2013 Last Pneumovax 2013 shingle shots 11-09  colonoscopy 2000 , 11-2009 ----> next 2021  Prostate cancer screening: DRE today normal, check a PSA    diet -- doing great exercise -- remains active

## 2013-11-29 NOTE — Assessment & Plan Note (Signed)
Asymptomatic, continue controlling cardiovascular risk factors. Has not needed nitroglycerin in years

## 2013-11-29 NOTE — Assessment & Plan Note (Signed)
Well controlled on PPIs 

## 2013-11-29 NOTE — Assessment & Plan Note (Addendum)
A1c 7.0 last month, has definitely changed his diet. Continue with present care, return to the office in 2 months

## 2013-11-29 NOTE — Progress Notes (Signed)
Subjective:    Patient ID: Harry Baker, male    DOB: [birth year], 68 y.o.   MRN: [id number]  DOS:  11/29/2013    Here for Medicare AWV:  1. Risk factors based on Past M, S, F history: reviewed  2. Physical Activities: active , has a farm  3. Depression/mood: (-) screening  4. Hearing: poor hearing, has a hearing aid implant L  5. ADL's: Independent  6. Fall Risk: low risk, see instructions  7. home Safety: does feelsafe at home  8. Height, weight, &visual acuity: see VS, vision corrected, last check 06-2013 9. Counseling: provided  10. Labs ordered based on risk factors: if needed  11. Referral Coordination: if needed  12. Care Plan, see assessment and plan  13. Cognitive Assessment: Cognition intact, motor skills normal.   In addition, today we discussed the following: Diabetes--good medication compliance, no recent ambulatory CBGs, diet has improved significantly High cholesterol, niaspan cost is an issue, good compliance w/ other meds . CAD--asymptomatic. GERD--on omeprazole, asymptomatic.  Past Medical History  Diagnosis Date  . CAD (coronary artery disease)     s/p stent 6/08(-) myoview, 8/11 negative stress test  . Diabetes mellitus   . Hyperlipidemia   . Diverticulosis of colon   . Hemorrhoid     problems off and on  . Vocal cord polyp   . Herniated disc     sess chiropractor 2010  . GERD   . ERECTILE DYSFUNCTION   . HOH (hard of hearing)     Past Surgical History  Procedure Laterality Date  . Tonsillectomy    . Vc cyst removal    . Mouth surgery  2010    History   Social History  . Marital Status: Married    Spouse Name: N/A    Number of Children: 1  . Years of Education: N/A   Occupational History  . COMPUTER WORK    Social History Main Topics  . Smoking status: Light Tobacco Smoker    Types: Cigars  . Smokeless tobacco: Never Used     Comment: occ cigar  . Alcohol Use: Yes     Comment: wine socially  . Drug Use: No  . Sexual Activity:  Not on file   Other Topics Concern  . 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 Prostate cancer      uncle  . Lung cancer Brother   . Colon cancer      great aunt  . Heart failure Mother   . Heart attack      GF  . Stroke Father     ROS  Denies chest pain or difficulty breathing Denies nausea, vomiting, diarrhea or blood in the stools. No dysuria or gross hematuria.     Objective:   Physical Exam BP 113/75  Pulse 86  Temp(Src) 98 F (36.7 C)  Ht 5' 8.2" (1.732 m)  Wt 236 lb (107.049 kg)  BMI 35.69 kg/m2  SpO2 96% General -- alert, well-developed, NAD.  Neck --no thyromegaly , normal carotid pulse  HEENT-- Not pale.   Lungs -- normal respiratory effort, no intercostal retractions, no accessory muscle use, and normal breath sounds.  Heart-- normal rate, regular rhythm, no murmur.  Abdomen-- Not distended, good bowel sounds,soft, non-tender. Rectal-- Normal sphincter tone. No rectal masses or tenderness. no stools found  Prostate--Prostate gland firm and smooth, no enlargement, nodularity, tenderness, mass, asymmetry or induration. Extremities-- no pretibial edema bilaterally  Neurologic--  alert & oriented X3. Speech normal, gait normal, strength normal in all extremities.  Psych-- Cognition and judgment appear intact. Cooperative with normal attention span and concentration. No anxious or depressed appearing.      Assessment & Plan:

## 2013-11-29 NOTE — Patient Instructions (Addendum)
Get your blood work before you leave   Next visit is for routine check up regards your blood sugar   in 2 months  No need to come back fasting Please make an appointment     Fall Prevention and [place] cause injuries and can affect all age groups. It is possible to use preventive measures to significantly decrease the likelihood of falls. There are many simple measures which can make your home safer and prevent falls. OUTDOORS  Repair cracks and edges of walkways and driveways.  Remove high doorway thresholds.  Trim shrubbery on the main path into your home.  Have good outside lighting.  Clear walkways of tools, rocks, debris, and clutter.  Check that handrails are not broken and are securely fastened. Both sides of steps should have handrails.  Have leaves, snow, and ice cleared regularly.  Use sand or salt on walkways during winter months.  In the garage, clean up grease or oil spills. BATHROOM  Install night lights.  Install grab bars by the toilet and in the tub and shower.  Use non-skid mats or decals in the tub or shower.  Place a plastic non-slip stool in the shower to sit on, if needed.  Keep floors dry and clean up all water on the floor immediately.  Remove soap buildup in the tub or shower on a regular basis.  Secure bath mats with non-slip, double-sided rug tape.  Remove throw rugs and tripping hazards from the floors. BEDROOMS  Install night lights.  Make sure a bedside light is easy to reach.  Do not use oversized bedding.  Keep a telephone by your bedside.  Have a firm chair with side arms to use for getting dressed.  Remove throw rugs and tripping hazards from the floor. KITCHEN  Keep handles on pots and pans turned toward the center of the stove. Use back burners when possible.  Clean up spills quickly and allow time for drying.  Avoid walking on wet floors.  Avoid hot utensils and knives.  Position shelves so they are not  too high or low.  Place commonly used objects within easy reach.  If necessary, use a sturdy step stool with a grab bar when reaching.  Keep electrical cables out of the way.  Do not use floor polish or wax that makes floors slippery. If you must use wax, use non-skid floor wax.  Remove throw rugs and tripping hazards from the floor. STAIRWAYS  Never leave objects on stairs.  Place handrails on both sides of stairways and use them. Fix any loose handrails. Make sure handrails on both sides of the stairways are as long as the stairs.  Check carpeting to make sure it is firmly attached along stairs. Make repairs to worn or loose carpet promptly.  Avoid placing throw rugs at the top or bottom of stairways, or properly secure the rug with carpet tape to prevent slippage. Get rid of throw rugs, if possible.  Have an electrician put in a light switch at the top and bottom of the stairs. OTHER FALL PREVENTION TIPS  Wear low-heel or rubber-soled shoes that are supportive and fit well. Wear closed toe shoes.  When using a stepladder, make sure it is fully opened and both spreaders are firmly locked. Do not climb a closed stepladder.  Add color or contrast paint or tape to grab bars and handrails in your home. Place contrasting color strips on first and last steps.  Learn and use mobility aids as  needed. Install an electrical emergency response system.  Turn on lights to avoid dark areas. Replace light bulbs that burn out immediately. Get light switches that glow.  Arrange furniture to create clear pathways. Keep furniture in the same place.  Firmly attach carpet with non-skid or double-sided tape.  Eliminate uneven floor surfaces.  Select a carpet pattern that does not visually hide the edge of steps.  Be aware of all pets. OTHER HOME SAFETY TIPS  Set the water temperature for 120 F (48.8 C).  Keep emergency numbers on or near the telephone.  Keep smoke detectors on every  level of the home and near sleeping areas. Document Released: 09/25/2002 Document Revised: 04/05/2012 Document Reviewed: 12/25/2011 [facility] Patient Information 2014 [place].

## 2013-11-29 NOTE — Assessment & Plan Note (Addendum)
Good compliance with Lipitor, zetia,Niaspan. Cost of  Niaspan is an issue but eventually the patient decided to continue w/ it . Recent LFTs ok, check FLP

## 2013-11-30 ENCOUNTER — Encounter: Payer: Self-pay | Admitting: Internal Medicine

## 2013-12-01 ENCOUNTER — Telehealth: Payer: Self-pay

## 2013-12-01 NOTE — Telephone Encounter (Signed)
Relevant patient education mailed to patient.  

## 2013-12-04 ENCOUNTER — Encounter: Payer: Self-pay | Admitting: *Deleted

## 2014-01-29 ENCOUNTER — Ambulatory Visit: Payer: Medicare Other | Admitting: Internal Medicine

## 2014-02-01 ENCOUNTER — Encounter: Payer: Self-pay | Admitting: Internal Medicine

## 2014-02-01 ENCOUNTER — Ambulatory Visit (INDEPENDENT_AMBULATORY_CARE_PROVIDER_SITE_OTHER): Payer: Medicare Other | Admitting: Internal Medicine

## 2014-02-01 ENCOUNTER — Encounter: Payer: Self-pay | Admitting: *Deleted

## 2014-02-01 VITALS — BP 116/75 | HR 66 | Temp 98.1°F | Wt 220.0 lb

## 2014-02-01 DIAGNOSIS — I2510 Atherosclerotic heart disease of native coronary artery without angina pectoris: Secondary | ICD-10-CM

## 2014-02-01 DIAGNOSIS — K649 Unspecified hemorrhoids: Secondary | ICD-10-CM | POA: Diagnosis not present

## 2014-02-01 DIAGNOSIS — E119 Type 2 diabetes mellitus without complications: Secondary | ICD-10-CM

## 2014-02-01 LAB — HEMOGLOBIN A1C: Hgb A1c MFr Bld: 5.9 % (ref 4.6–6.5)

## 2014-02-01 MED ORDER — METFORMIN HCL 850 MG PO TABS: 850.0000 mg | ORAL_TABLET | Freq: Two times a day (BID) | ORAL | Status: DC

## 2014-02-01 NOTE — Progress Notes (Signed)
Subjective:    Patient ID: Harry Baker, male    DOB: 1946-05-02, 68 y.o.   MRN: [id number]  DOS:  02/01/2014 Type of  visit: Followup from previous visit Diabetes, currently on metformin only, see assessment and plan. Has changed his diet dramatically, and has lost more than 25 pounds. Feeling well.   ROS Mild blurred vision due to cataracts Denies any feet numbness or burning. When asked, admits to red blood in the stools and  the toilet paper last week, a one-time event, not associated with itching or hurting at the anal area. In the past he was told he had hemorrhoids. Denies nausea, vomiting or diarrhea  Past Medical History  Diagnosis Date  . CAD (coronary artery disease)     s/p stent 6/08(-) myoview, 8/11 negative stress test  . Diabetes mellitus   . Hyperlipidemia   . Diverticulosis of colon   . Hemorrhoid     problems off and on  . Vocal cord polyp   . Herniated disc     sess chiropractor 2010  . GERD   . ERECTILE DYSFUNCTION   . HOH (hard of hearing)     Past Surgical History  Procedure Laterality Date  . Tonsillectomy    . Vc cyst removal    . Mouth surgery  2010    History   Social History  . Marital Status: Married    Spouse Name: N/A    Number of Children: 1  . Years of Education: N/A   Occupational History  . COMPUTER WORK    Social History Main Topics  . Smoking status: Light Tobacco Smoker    Types: Cigars  . Smokeless tobacco: Never Used     Comment: occ cigar  . Alcohol Use: Yes     Comment: wine socially  . Drug Use: No  . Sexual Activity: Not on file   Other Topics Concern  . 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  Always use your most recent med list.               aspirin 325 MG tablet  Take 325 mg by mouth daily.     atorvastatin 40 MG tablet  Commonly known as:  LIPITOR  TAKE  1 TABLET BY MOUTH DAILY     co-enzyme Q-10 30 MG capsule  Take 100 mg by mouth daily.     fish oil-omega-3 fatty acids 1000 MG capsule  Take 4 g by mouth daily.     GLUCOSAMINE-CHONDROITIN PO  Take 1 tablet by mouth daily.     metFORMIN 850 MG tablet  Commonly known as:  GLUCOPHAGE  Take 1 tablet (850 mg total) by mouth 2 (two) times daily with a meal.     multivitamin capsule  Take 1 capsule by mouth daily.     niacin 1000 MG CR tablet  Commonly known as:  NIASPAN  TAKE 1 TABLET BY MOUTH ONCE DAILY     nitroGLYCERIN 0.4 MG SL tablet  Commonly known as:  NITROSTAT  Place 1 tablet (0.4 mg total) under the tongue every 5 (five) minutes as needed.     omeprazole 20 MG tablet  Commonly known as:  PRILOSEC OTC  Take 20 mg by mouth daily.  ramipril 5 MG capsule  Commonly known as:  ALTACE  TAKE ONE CAPSULE BY MOUTH DAILY     ZETIA 10 MG tablet  Generic drug:  ezetimibe  TAKE 1 TABLET BY MOUTH DAILY           Objective:   Physical Exam BP 116/75  Pulse 66  Temp(Src) 98.1 F (36.7 C)  Wt 220 lb (99.791 kg)  SpO2 99% General -- alert, well-developed, NAD.  DIABETIC FEET EXAM: No lower extremity edema Normal pedal pulses bilaterally Skin normal, nails normal, no calluses Pinprick examination of the feet normal. DRE-- External anal skin tags, otherwise normal Anoscopy-- few small internal hemorrhoids, a large internal hemorrhoid (~1 cm)  at around 6:00 with no signs of leaking at this time    Psych-- Cognition and judgment appear intact. Cooperative with normal attention span and concentration. No anxious or depressed appearing.      Assessment & Plan:

## 2014-02-01 NOTE — Patient Instructions (Signed)
Get your blood work before you leave     Next visit is for routine check up regards your blood sugar  in 4 months  No need to come back fasting Please make an appointment    

## 2014-02-01 NOTE — Assessment & Plan Note (Addendum)
Based on last A1c, he was recommendedjanumet, he took it for a few weeks but then decided to go back to  metformin as he had a 90 day supply. In addition, he is doing great with diet, has lost more than 20 pounds. Feet exam negative today. Plan: Continue with present care, check the A1c

## 2014-02-01 NOTE — Progress Notes (Signed)
Pre visit review using our clinic review tool, if applicable. No additional management support is needed unless otherwise documented below in the visit note. 

## 2014-02-01 NOTE — Assessment & Plan Note (Signed)
Hemorrhoids, Recent single episode of red blood per rectum, anoscopy showed hemorrhoids, he is up-to-date on his colonoscopy . Plan: Observation, will call if symptoms increase and recurr

## 2014-02-05 ENCOUNTER — Other Ambulatory Visit: Payer: Self-pay | Admitting: Internal Medicine

## 2014-02-06 NOTE — Telephone Encounter (Signed)
Rx's sent to the pharmacy by e-script.//AB/CMA 

## 2014-06-05 ENCOUNTER — Encounter: Payer: Self-pay | Admitting: Gastroenterology

## 2014-06-19 ENCOUNTER — Encounter: Payer: Self-pay | Admitting: Internal Medicine

## 2014-06-19 ENCOUNTER — Ambulatory Visit (INDEPENDENT_AMBULATORY_CARE_PROVIDER_SITE_OTHER): Payer: Medicare Other | Admitting: Internal Medicine

## 2014-06-19 VITALS — BP 110/68 | HR 68 | Temp 97.6°F | Wt 195.3 lb

## 2014-06-19 DIAGNOSIS — E119 Type 2 diabetes mellitus without complications: Secondary | ICD-10-CM

## 2014-06-19 DIAGNOSIS — I2510 Atherosclerotic heart disease of native coronary artery without angina pectoris: Secondary | ICD-10-CM

## 2014-06-19 LAB — COMPREHENSIVE METABOLIC PANEL
ALT: 16 U/L (ref 0–53)
AST: 25 U/L (ref 0–37)
Albumin: 4.2 g/dL (ref 3.5–5.2)
Alkaline Phosphatase: 64 U/L (ref 39–117)
BUN: 15 mg/dL (ref 6–23)
CALCIUM: 9.5 mg/dL (ref 8.4–10.5)
CHLORIDE: 102 meq/L (ref 96–112)
CO2: 30 mEq/L (ref 19–32)
Creatinine, Ser: 1 mg/dL (ref 0.4–1.5)
GFR: 80.98 mL/min (ref >60.00)
GLUCOSE: 91 mg/dL (ref 70–99)
POTASSIUM: 3.9 meq/L (ref 3.5–5.1)
Sodium: 140 mEq/L (ref 135–145)
Total Bilirubin: 0.8 mg/dL (ref 0.2–1.2)
Total Protein: 7 g/dL (ref 6.0–8.3)

## 2014-06-19 LAB — HEMOGLOBIN A1C: HEMOGLOBIN A1C: 6.1 % (ref 4.6–6.5)

## 2014-06-19 MED ORDER — LANCETS 30G MISC: Status: DC

## 2014-06-19 MED ORDER — GLUCOSE BLOOD VI STRP: ORAL_STRIP | Status: DC

## 2014-06-19 MED ORDER — NEOMYCIN-POLYMYXIN-HC 3.5-10000-1 OT SUSP: 3.0000 [drp] | Freq: Four times a day (QID) | OTIC | Status: DC

## 2014-06-19 NOTE — Patient Instructions (Addendum)
Get your blood work before you leave   Next visit is by 11-2014   for: A physical exam  Fasting  Please make an appointment     Check your blood sugars once a approximately, consistently less than 80 or more than 160  Use the eardrops for 5 days

## 2014-06-19 NOTE — Assessment & Plan Note (Signed)
Last A1c 5.9, on metformin only, no symptoms consistent with hypoglycemia. Plan: A1c Provided glucose monitor Continue with metformin for now, he is very low risk for hypoglycemia even with a low A1c

## 2014-06-19 NOTE — Assessment & Plan Note (Signed)
Symptoms well-controlled with Prilosec every other day, patient is concerned about stopping PPIs because he takes aspirin. Plan: PPIs  Every 3 days

## 2014-06-19 NOTE — Progress Notes (Signed)
Subjective:    Patient ID: Harry Baker, male    DOB: 1945/10/30, 68 y.o.   MRN: [id number]  DOS:  06/19/2014 Type of visit - description : routine Interval history: Diabetes, doing great with exercise, has lost 25 pounds, taking metformin one tablet daily, no ambulatory CBGs, no blood sugar symptoms. Had a left ear watery  discharge few days ago, had to remove his hearing aid, likes ear to be checked. No pain or d/c at this time.   ROS Denies chest pain or difficulty breathing No  nausea, vomiting, diarrhea  Past Medical History  Diagnosis Date  . CAD (coronary artery disease)     s/p stent 6/08(-) myoview, 8/11 negative stress test  . Diabetes mellitus   . Hyperlipidemia   . Diverticulosis of colon   . Hemorrhoid     problems off and on  . Vocal cord polyp   . Herniated disc     sess chiropractor 2010  . GERD   . ERECTILE DYSFUNCTION   . HOH (hard of hearing)     Past Surgical History  Procedure Laterality Date  . Tonsillectomy    . Vc cyst removal    . Mouth surgery  2010    History   Social History  . Marital Status: Married    Spouse Name: N/A    Number of Children: 1  . Years of Education: N/A   Occupational History  . COMPUTER WORK    Social History Main Topics  . Smoking status: Light Tobacco Smoker    Types: Cigars  . Smokeless tobacco: Never Used     Comment: occ cigar  . Alcohol Use: Yes     Comment: wine socially  . Drug Use: No  . Sexual Activity: Not on file   Other Topics Concern  . 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  Always use your most recent med list.               aspirin 325 MG tablet  Take 325 mg by mouth daily.     atorvastatin 40 MG tablet  Commonly known as:  LIPITOR  TAKE 1 TABLET BY MOUTH EVERY DAY     co-enzyme Q-10 30 MG capsule  Take 50 mg by mouth daily.     fish  oil-omega-3 fatty acids 1000 MG capsule  Take 4 g by mouth daily.     GLUCOSAMINE-CHONDROITIN PO  Take 1 tablet by mouth daily.     glucose blood test strip  Use as instructed     Lancets 30G Misc  Use as directed     metFORMIN 850 MG tablet  Commonly known as:  GLUCOPHAGE  Take 850 mg by mouth daily with breakfast.     multivitamin capsule  Take 1 capsule by mouth daily.     neomycin-polymyxin-hydrocortisone 3.5-10000-1 otic suspension  Commonly known as:  CORTISPORIN  Place 3 drops into the left ear 4 (four) times daily. As directed     niacin 1000 MG CR tablet  Commonly known as:  NIASPAN  TAKE 1 TABLET BY MOUTH EVERY DAY     nitroGLYCERIN 0.4 MG SL tablet  Commonly known as:  NITROSTAT  Place 1 tablet (0.4 mg total) under the tongue every 5 (five)  minutes as needed.     omeprazole 20 MG tablet  Commonly known as:  PRILOSEC OTC  Take 20 mg by mouth daily. Take one tablet every  3th day.     ramipril 5 MG capsule  Commonly known as:  ALTACE  TAKE 1 CAPSULE BY MOUTH EVERY DAY     ZETIA 10 MG tablet  Generic drug:  ezetimibe  TAKE 1 TABLET BY MOUTH EVERY DAY           Objective:   Physical Exam BP 110/68  Pulse 68  Temp(Src) 97.6 F (36.4 C) (Oral)  Wt 195 lb 5.2 oz (88.6 kg)  SpO2 98%  General -- alert, well-developed, NAD.  L Ear-- canal w/o swelling-d/c, had a area of redness (?trauma from hearing aid) Breast-- no dominant mass, skin and nipples normal to inspection on palpation, axillary areas without mass or lymphadenopathy Lungs -- normal respiratory effort, no intercostal retractions, no accessory muscle use, and normal breath sounds.  Extremities-- no pretibial edema bilaterally  Neurologic--  alert & oriented X3. Speech normal, gait appropriate for age, strength symmetric and appropriate for age.  Psych-- Cognition and judgment appear intact. Cooperative with normal attention span and concentration. No anxious or depressed appearing.       Assessment & Plan:   History of previous ear discharge, will prescribe ciprofloxacin for a few days. Patient to call if not improved

## 2014-06-19 NOTE — Progress Notes (Signed)
Pre visit review using our clinic review tool, if applicable. No additional management support is needed unless otherwise documented below in the visit note. 

## 2014-07-10 ENCOUNTER — Other Ambulatory Visit: Payer: Self-pay | Admitting: Internal Medicine

## 2014-07-11 DIAGNOSIS — H524 Presbyopia: Secondary | ICD-10-CM | POA: Diagnosis not present

## 2014-07-11 DIAGNOSIS — H2510 Age-related nuclear cataract, unspecified eye: Secondary | ICD-10-CM | POA: Diagnosis not present

## 2014-07-11 DIAGNOSIS — H31099 Other chorioretinal scars, unspecified eye: Secondary | ICD-10-CM | POA: Diagnosis not present

## 2014-07-11 DIAGNOSIS — E119 Type 2 diabetes mellitus without complications: Secondary | ICD-10-CM | POA: Diagnosis not present

## 2014-07-11 DIAGNOSIS — H25049 Posterior subcapsular polar age-related cataract, unspecified eye: Secondary | ICD-10-CM | POA: Diagnosis not present

## 2014-07-31 ENCOUNTER — Ambulatory Visit (INDEPENDENT_AMBULATORY_CARE_PROVIDER_SITE_OTHER): Payer: Medicare Other | Admitting: *Deleted

## 2014-07-31 DIAGNOSIS — Z23 Encounter for immunization: Secondary | ICD-10-CM

## 2014-08-20 ENCOUNTER — Other Ambulatory Visit: Payer: Self-pay | Admitting: Internal Medicine

## 2014-08-21 ENCOUNTER — Other Ambulatory Visit: Payer: Self-pay

## 2014-08-21 MED ORDER — ATORVASTATIN CALCIUM 40 MG PO TABS: ORAL_TABLET | ORAL | Status: DC

## 2014-08-21 MED ORDER — RAMIPRIL 5 MG PO CAPS: ORAL_CAPSULE | ORAL | Status: DC

## 2014-08-21 NOTE — Progress Notes (Signed)
HPI: FU coronary artery disease with prior PCI of his right coronary artery in August 2000. He also had residual LAD disease at that time. He had an abdominal ultrasound in July 2007 that showed no aneurysm. He has also had previous carotid Dopplers performed on September 24, 2005, which showed normal carotid arteries. His most recent Myoview in August of 2014 showed no ischemia or infarction and ejection fraction of 62%. Since I last saw him, the patient denies any dyspnea on exertion, orthopnea, PND, pedal edema, palpitations, syncope or chest pain.   Current Outpatient Prescriptions  Medication Sig Dispense Refill  . aspirin 325 MG tablet Take 325 mg by mouth daily.      Marland Kitchen atorvastatin (LIPITOR) 40 MG tablet TAKE 1 TABLET BY MOUTH EVERY DAY 90 tablet 1  . co-enzyme Q-10 30 MG capsule Take 50 mg by mouth daily.     . fish oil-omega-3 fatty acids 1000 MG capsule Take 4 g by mouth daily.     Marland Kitchen GLUCOSAMINE-CHONDROITIN PO Take 1 tablet by mouth daily.    . metFORMIN (GLUCOPHAGE) 850 MG tablet TAKE 1 TABLET BY MOUTH TWICE DAILY WITH A MEAL 60 tablet 6  . Multiple Vitamin (MULTIVITAMIN) capsule Take 1 capsule by mouth daily.      . niacin (NIASPAN) 1000 MG CR tablet TAKE 1 TABLET BY MOUTH EVERY DAY 90 tablet 1  . omeprazole (PRILOSEC OTC) 20 MG tablet Take 20 mg by mouth daily. Take one tablet every  3th day.    . ramipril (ALTACE) 5 MG capsule TAKE 1 CAPSULE BY MOUTH EVERY DAY 90 capsule 1  . ZETIA 10 MG tablet TAKE 1 TABLET BY MOUTH EVERY DAY 90 tablet 1  . glucose blood test strip Use as instructed 100 each 12  . Lancets 30G MISC Use as directed 100 each 12  . nitroGLYCERIN (NITROSTAT) 0.4 MG SL tablet Place 1 tablet (0.4 mg total) under the tongue every 5 (five) minutes as needed. 30 tablet 2   No current facility-administered medications for this visit.     Past Medical History  Diagnosis Date  . CAD (coronary artery disease)     s/p stent 6/08(-) myoview, 8/11 negative stress  test  . Diabetes mellitus   . Hyperlipidemia   . Diverticulosis of colon   . Hemorrhoid     problems off and on  . Vocal cord polyp   . Herniated disc     sess chiropractor 2010  . GERD   . ERECTILE DYSFUNCTION   . HOH (hard of hearing)     Past Surgical History  Procedure Laterality Date  . Tonsillectomy    . Vc cyst removal    . Mouth surgery  2010    History   Social History  . Marital Status: Married    Spouse Name: N/A    Number of Children: 1  . Years of Education: N/A   Occupational History  . COMPUTER WORK    Social History Main Topics  . Smoking status: Light Tobacco Smoker    Types: Cigars  . Smokeless tobacco: Never Used     Comment: occ cigar  . Alcohol Use: Yes     Comment: wine socially  . Drug Use: No  . Sexual Activity: Not on file   Other Topics Concern  . 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 Remaining systems are negative.  Physical Exam: Well-developed well-nourished in no acute distress.  Skin is warm and dry.  HEENT is normal.  Neck is supple.  Chest is clear to auscultation with normal expansion.  Cardiovascular exam is regular rate and rhythm.  Abdominal exam nontender or distended. No masses palpated. Extremities show no edema. neuro grossly intact  ECG Normal sinus rhythm at a rate of 80. No ST changes.

## 2014-08-22 ENCOUNTER — Ambulatory Visit (INDEPENDENT_AMBULATORY_CARE_PROVIDER_SITE_OTHER): Payer: Medicare Other | Admitting: Cardiology

## 2014-08-22 ENCOUNTER — Encounter: Payer: Self-pay | Admitting: Cardiology

## 2014-08-22 VITALS — BP 126/79 | HR 80 | Ht 68.5 in | Wt 203.0 lb

## 2014-08-22 DIAGNOSIS — I2510 Atherosclerotic heart disease of native coronary artery without angina pectoris: Secondary | ICD-10-CM

## 2014-08-22 NOTE — Patient Instructions (Signed)
Your physician wants you to follow-up in: ONE YEAR WITH DR CRENSHAW You will receive a reminder letter in the mail two months in advance. If you don't receive a letter, please call our office to schedule the follow-up appointment.  

## 2014-08-22 NOTE — Assessment & Plan Note (Signed)
Continue aspirin and statin. 

## 2014-08-22 NOTE — Assessment & Plan Note (Signed)
Continue statin. 

## 2014-12-03 ENCOUNTER — Encounter: Payer: Medicare Other | Admitting: Internal Medicine

## 2014-12-17 ENCOUNTER — Ambulatory Visit (INDEPENDENT_AMBULATORY_CARE_PROVIDER_SITE_OTHER): Payer: Medicare Other | Admitting: Internal Medicine

## 2014-12-17 ENCOUNTER — Encounter: Payer: Self-pay | Admitting: Internal Medicine

## 2014-12-17 VITALS — BP 109/74 | HR 77 | Temp 98.2°F | Ht 69.0 in | Wt 220.2 lb

## 2014-12-17 DIAGNOSIS — E1159 Type 2 diabetes mellitus with other circulatory complications: Secondary | ICD-10-CM

## 2014-12-17 DIAGNOSIS — Z0000 Encounter for general adult medical examination without abnormal findings: Secondary | ICD-10-CM

## 2014-12-17 DIAGNOSIS — E785 Hyperlipidemia, unspecified: Secondary | ICD-10-CM | POA: Diagnosis not present

## 2014-12-17 DIAGNOSIS — Z23 Encounter for immunization: Secondary | ICD-10-CM | POA: Diagnosis not present

## 2014-12-17 DIAGNOSIS — H9193 Unspecified hearing loss, bilateral: Secondary | ICD-10-CM

## 2014-12-17 DIAGNOSIS — K219 Gastro-esophageal reflux disease without esophagitis: Secondary | ICD-10-CM | POA: Diagnosis not present

## 2014-12-17 LAB — BASIC METABOLIC PANEL
BUN: 17 mg/dL (ref 6–23)
CO2: 30 meq/L (ref 19–32)
Calcium: 9.5 mg/dL (ref 8.4–10.5)
Chloride: 104 mEq/L (ref 96–112)
Creatinine, Ser: 0.9 mg/dL (ref 0.40–1.50)
GFR: 89.21 mL/min (ref >60.00)
Glucose, Bld: 105 mg/dL — ABNORMAL HIGH (ref 70–99)
POTASSIUM: 4.3 meq/L (ref 3.5–5.1)
SODIUM: 140 meq/L (ref 135–145)

## 2014-12-17 LAB — CBC WITH DIFFERENTIAL/PLATELET
BASOS ABS: 0.1 10*3/uL (ref 0.0–0.1)
BASOS PCT: 1.5 % (ref 0.0–3.0)
Eosinophils Absolute: 0.3 10*3/uL (ref 0.0–0.7)
Eosinophils Relative: 6.8 % — ABNORMAL HIGH (ref 0.0–5.0)
HCT: 41 % (ref 39.0–52.0)
HEMOGLOBIN: 13.9 g/dL (ref 13.0–17.0)
LYMPHS PCT: 34 % (ref 12.0–46.0)
Lymphs Abs: 1.7 10*3/uL (ref 0.7–4.0)
MCHC: 34 g/dL (ref 30.0–36.0)
MCV: 91.8 fl (ref 78.0–100.0)
MONO ABS: 0.5 10*3/uL (ref 0.1–1.0)
Monocytes Relative: 9.7 % (ref 3.0–12.0)
NEUTROS ABS: 2.4 10*3/uL (ref 1.4–7.7)
Neutrophils Relative %: 48 % (ref 43.0–77.0)
Platelets: 233 10*3/uL (ref 150.0–400.0)
RBC: 4.47 Mil/uL (ref 4.22–5.81)
RDW: 13.8 % (ref 11.5–15.5)
WBC: 5.1 10*3/uL (ref 4.0–10.5)

## 2014-12-17 LAB — LIPID PANEL
CHOL/HDL RATIO: 3
CHOLESTEROL: 130 mg/dL (ref 0–200)
HDL: 50.3 mg/dL (ref >39.00)
LDL Cholesterol: 67 mg/dL (ref 0–99)
NonHDL: 79.7
TRIGLYCERIDES: 63 mg/dL (ref 0.0–149.0)
VLDL: 12.6 mg/dL (ref 0.0–40.0)

## 2014-12-17 LAB — HEMOGLOBIN A1C: Hgb A1c MFr Bld: 6.3 % (ref 4.6–6.5)

## 2014-12-17 MED ORDER — RANITIDINE HCL 150 MG PO TABS: 150.0000 mg | ORAL_TABLET | Freq: Two times a day (BID) | ORAL | Status: DC

## 2014-12-17 NOTE — Progress Notes (Signed)
Pre visit review using our clinic review tool, if applicable. No additional management support is needed unless otherwise documented below in the visit note. 

## 2014-12-17 NOTE — Assessment & Plan Note (Signed)
Good compliance with metformin, check labs

## 2014-12-17 NOTE — Patient Instructions (Signed)
Get your blood work before you leave    Please come back to the office in 6 months  for a routine check up  No  fasting      Fall Prevention and Home Safety Falls cause injuries and can affect all age groups. It is possible to use preventive measures to significantly decrease the likelihood of falls. There are many simple measures which can make your home safer and prevent falls. OUTDOORS  Repair cracks and edges of walkways and driveways.  Remove high doorway thresholds.  Trim shrubbery on the main path into your home.  Have good outside lighting.  Clear walkways of tools, rocks, debris, and clutter.  Check that handrails are not broken and are securely fastened. Both sides of steps should have handrails.  Have leaves, snow, and ice cleared regularly.  Use sand or salt on walkways during winter months.  In the garage, clean up grease or oil spills. BATHROOM  Install night lights.  Install grab bars by the toilet and in the tub and shower.  Use non-skid mats or decals in the tub or shower.  Place a plastic non-slip stool in the shower to sit on, if needed.  Keep floors dry and clean up all water on the floor immediately.  Remove soap buildup in the tub or shower on a regular basis.  Secure bath mats with non-slip, double-sided rug tape.  Remove throw rugs and tripping hazards from the floors. BEDROOMS  Install night lights.  Make sure a bedside light is easy to reach.  Do not use oversized bedding.  Keep a telephone by your bedside.  Have a firm chair with side arms to use for getting dressed.  Remove throw rugs and tripping hazards from the floor. KITCHEN  Keep handles on pots and pans turned toward the center of the stove. Use back burners when possible.  Clean up spills quickly and allow time for drying.  Avoid walking on wet floors.  Avoid hot utensils and knives.  Position shelves so they are not too high or low.  Place commonly used objects  within easy reach.  If necessary, use a sturdy step stool with a grab bar when reaching.  Keep electrical cables out of the way.  Do not use floor polish or wax that makes floors slippery. If you must use wax, use non-skid floor wax.  Remove throw rugs and tripping hazards from the floor. STAIRWAYS  Never leave objects on stairs.  Place handrails on both sides of stairways and use them. Fix any loose handrails. Make sure handrails on both sides of the stairways are as long as the stairs.  Check carpeting to make sure it is firmly attached along stairs. Make repairs to worn or loose carpet promptly.  Avoid placing throw rugs at the top or bottom of stairways, or properly secure the rug with carpet tape to prevent slippage. Get rid of throw rugs, if possible.  Have an electrician put in a light switch at the top and bottom of the stairs. OTHER FALL PREVENTION TIPS  Wear low-heel or rubber-soled shoes that are supportive and fit well. Wear closed toe shoes.  When using a stepladder, make sure it is fully opened and both spreaders are firmly locked. Do not climb a closed stepladder.  Add color or contrast paint or tape to grab bars and handrails in your home. Place contrasting color strips on first and last steps.  Learn and use mobility aids as needed. Install an electrical emergency response  system.  Turn on lights to avoid dark areas. Replace light bulbs that burn out immediately. Get light switches that glow.  Arrange furniture to create clear pathways. Keep furniture in the same place.  Firmly attach carpet with non-skid or double-sided tape.  Eliminate uneven floor surfaces.  Select a carpet pattern that does not visually hide the edge of steps.  Be aware of all pets. OTHER HOME SAFETY TIPS  Set the water temperature for 120 F (48.8 C).  Keep emergency numbers on or near the telephone.  Keep smoke detectors on every level of the home and near sleeping  areas. Document Released: 09/25/2002 Document Revised: 04/05/2012 Document Reviewed: 12/25/2011 [facility] Patient Information 2015 [place], [place]. This information is not intended to replace advice given to you by your health care provider. Make sure you discuss any questions you have with your health care provider.   Preventive Care for Adults Ages 79 and over  Blood pressure check.** / Every 1 to 2 years.  Lipid and cholesterol check.**/ Every 5 years beginning at age 84.  Lung cancer screening. / Every year if you are aged 44-80 years and have a 30-pack-year history of smoking and currently smoke or have quit within the past 15 years. Yearly screening is stopped once you have quit smoking for at least 15 years or develop a health problem that would prevent you from having lung cancer treatment.  Fecal occult blood test (FOBT) of stool. / Every year beginning at age 68 and continuing until age 88. You may not have to do this test if you get a colonoscopy every 10 years.  Flexible sigmoidoscopy** or colonoscopy.** / Every 5 years for a flexible sigmoidoscopy or every 10 years for a colonoscopy beginning at age 67 and continuing until age 16.  Hepatitis C blood test.** / For all people born from [birth year] through 1964 and any individual with known risks for hepatitis C.  Abdominal aortic aneurysm (AAA) screening.** / A one-time screening for ages 72 to 30 years who are current or former smokers.  Skin self-exam. / Monthly.  Influenza vaccine. / Every year.  Tetanus, diphtheria, and acellular pertussis (Tdap/Td) vaccine.** / 1 dose of Td every 10 years.  Varicella vaccine.** / Consult your health care provider.  Zoster vaccine.** / 1 dose for adults aged 16 years or older.  Pneumococcal 13-valent conjugate (PCV13) vaccine.** / Consult your health care provider.  Pneumococcal polysaccharide (PPSV23) vaccine.** / 1 dose for all adults aged 45 years and older.  Meningococcal vaccine.** /  Consult your health care provider.  Hepatitis A vaccine.** / Consult your health care provider.  Hepatitis B vaccine.** / Consult your health care provider.  Haemophilus influenzae type b (Hib) vaccine.** / Consult your health care provider. **Family history and personal history of risk and conditions may change your health care provider's recommendations. Document Released: 12/01/2001 Document Revised: 10/10/2013 Document Reviewed: 03/02/2011 [facility] Patient Information 2015 [place], [place]. This information is not intended to replace advice given to you by your health care provider. Make sure you discuss any questions you have with your health care provider.

## 2014-12-17 NOTE — Progress Notes (Signed)
Subjective:    Patient ID: Harry Baker, male    DOB: [birth year], 69 y.o.   MRN: [id number]  DOS:  12/17/2014 Type of visit - description :   Here for Medicare AWV:  1. Risk factors based on Past M, S, F history: reviewed  2. Physical Activities: active, has a farm 3. Depression/mood: (-) screening  4. Hearing: poor hearing, has a hearing aid R and implant on the L ---- needs a referral  to [facility]  5. ADL's: Independent  6. Fall Risk: low risk, see instructions  7. home Safety: does feel safe at home  8. Height, weight, &visual acuity: see VS, vision corrected, h/o cataracts, sees eye MD q year 9. Counseling: provided  10. Labs ordered based on risk factors: if needed  11. Referral Coordination: if needed  12. Care Plan, see assessment and plan , written plan provided 13. Cognitive Assessment: Cognition intact, motor skills normal.  14. Care team updated 15. End-of-life care discussed : has already discuss w/ wife, rec to get a copy of his  medical will , he is a organ donor   In addition, today we discussed the following CAD, good compliance with medications, concern about the aspirin 325 mg dose. GERD, currently asymptomatic, on Prevacid every other day. Still concerned about PPIs Diabetes, better compliance with metformin, not ambulatory CBGs High cholesterol, good compliance with medicines.   Review of Systems  Constitutional: No fever, chills. No unexplained wt changes. No unusual sweats HEENT: + dental problems (5 crowns recently!), ear discharge, facial swelling, voice changes. No eye discharge, redness or intolerance to light Respiratory: No wheezing or difficulty breathing.  Had a URI earlier this month, since then is better but has a persistent on and off cough, getting better. No postnasal dripping Cardiovascular: No CP, leg swelling or palpitations GI: no nausea, vomiting, diarrhea or abdominal pain.  No blood in the stools. No  dysphagia   Endocrine: No polyphagia, polyuria or polydipsia GU: No dysuria, gross hematuria, difficulty urinating. No urinary urgency or frequency. Musculoskeletal: No joint swellings or unusual aches or pains Skin: No change in the color of the skin, palor or rash Allergic, immunologic: No environmental allergies or food allergies Neurological: No dizziness or syncope. No headaches. No diplopia, slurred speech, motor deficits, facial numbness Hematological: No enlarged lymph nodes, easy bruising or bleeding Psychiatry: No suicidal ideas, hallucinations, behavior problems or confusion. No unusual/severe anxiety or depression.    Past Medical History  Diagnosis Date  . CAD (coronary artery disease)     s/p stent 6/08(-) myoview, 8/11 negative stress test  . Diabetes mellitus   . Hyperlipidemia   . Diverticulosis of colon   . Hemorrhoid     problems off and on  . Vocal cord polyp   . Herniated disc     sess chiropractor 2010  . GERD   . ERECTILE DYSFUNCTION   . HOH (hard of hearing)     Past Surgical History  Procedure Laterality Date  . Tonsillectomy    . Vc cyst removal    . Mouth surgery  2010    History   Social History  . Marital Status: Married    Spouse Name: N/A  . Number of Children: 1  . Years of Education: N/A   Occupational History  . 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 Smoking status: Light Tobacco Smoker    Types: Cigars  . Smokeless tobacco: Never  Used     Comment: occ cigar  . Alcohol Use: Yes     Comment: wine socially  . Drug Use: No  . Sexual Activity: Not on file   Other Topics Concern  . 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 Prostate cancer Other     uncle  . Lung cancer Brother   . Colon cancer Other     great aunt  . Heart failure Mother   . Heart attack Other     GF  . Stroke Father           Medication List       This list is accurate as of: 12/17/14  9:02 PM.  Always use your most recent med list.               aspirin 325 MG tablet  Take 325 mg by mouth daily.     atorvastatin 40 MG tablet  Commonly known as:  LIPITOR  TAKE 1 TABLET BY MOUTH EVERY DAY     co-enzyme Q-10 30 MG capsule  Take 30 mg by mouth daily.     fish oil-omega-3 fatty acids 1000 MG capsule  Take 4 g by mouth daily.     GLUCOSAMINE-CHONDROITIN PO  Take 1 tablet by mouth daily.     glucose blood test strip  Use as instructed     Lancets 30G Misc  Use as directed     metFORMIN 850 MG tablet  Commonly known as:  GLUCOPHAGE  TAKE 1 TABLET BY MOUTH TWICE DAILY WITH A MEAL     multivitamin capsule  Take 1 capsule by mouth daily.     niacin 1000 MG CR tablet  Commonly known as:  NIASPAN  TAKE 1 TABLET BY MOUTH EVERY DAY     nitroGLYCERIN 0.4 MG SL tablet  Commonly known as:  NITROSTAT  Place 1 tablet (0.4 mg total) under the tongue every 5 (five) minutes as needed.     ramipril 5 MG capsule  Commonly known as:  ALTACE  TAKE 1 CAPSULE BY MOUTH EVERY DAY     ranitidine 150 MG tablet  Commonly known as:  ZANTAC  Take 1 tablet (150 mg total) by mouth 2 (two) times daily.     ZETIA 10 MG tablet  Generic drug:  ezetimibe  TAKE 1 TABLET BY MOUTH EVERY DAY           Objective:   Physical Exam BP 109/74 mmHg  Pulse 77  Temp(Src) 98.2 F (36.8 C) (Oral)  Ht 5\' 9"  (1.753 m)  Wt 220 lb 4 oz (99.905 kg)  BMI 32.51 kg/m2  SpO2 99%  General:   Well developed, well nourished . NAD.  Neck:  Full range of motion. Supple. No  thyromegaly. HEENT:  Normocephalic . Face symmetric, atraumatic; nose is slightly congestion, sinus no TTP Lungs:  CTA B Normal respiratory effort, no intercostal retractions, no accessory muscle use. Heart: RRR,  no murmur.  Abdomen:  Not distended, soft, non-tender. No rebound or rigidity. No mass,organomegaly; no bruit  Muscle skeletal: no  pretibial edema bilaterally  Skin: Exposed areas without rash. Not pale. Not jaundice Neurologic:  alert & oriented X3.  Speech normal, gait appropriate for age and unassisted Strength symmetric and appropriate for age.  Psych: Cognition and judgment appear intact.  Cooperative with  normal attention span and concentration.  Behavior appropriate. No anxious or depressed appearing.       Assessment & Plan:

## 2014-12-17 NOTE — Assessment & Plan Note (Signed)
Good compliance with medications, check labs. Last LFTs normal.

## 2014-12-17 NOTE — Assessment & Plan Note (Addendum)
Currently on PPIs every third day, asymptomatic (does have a cough but apparently related to a URI). Still concerned about the long-term effects of PPIs. Plan: Discontinue PPIs, start Zantac twice a day as needed and watch diet (no late eating, avoid GERD triggers etc)

## 2014-12-17 NOTE — Assessment & Plan Note (Addendum)
Td 2013 Last Pneumovax 2013 Prevnar 11-2014 shingle shots 11-09  colonoscopy 2000 , 11-2009 ----> next 2021  DRE and PSA normal last year, reassess prostate cancer screening 2017     diet  and exercise-- has gained some weight, he is ready to go back to a healthier lifestyle. Counseled

## 2014-12-18 ENCOUNTER — Telehealth: Payer: Self-pay | Admitting: Internal Medicine

## 2014-12-18 NOTE — Telephone Encounter (Signed)
emmi mailed  °

## 2014-12-19 ENCOUNTER — Encounter: Payer: Self-pay | Admitting: Internal Medicine

## 2014-12-19 NOTE — Addendum Note (Signed)
Addended by: Kathlene November E on: 12/19/2014 05:54 PM   Modules accepted: Level of Service, SmartSet

## 2014-12-25 ENCOUNTER — Other Ambulatory Visit: Payer: Self-pay

## 2014-12-25 ENCOUNTER — Other Ambulatory Visit: Payer: Self-pay | Admitting: Internal Medicine

## 2015-01-25 ENCOUNTER — Other Ambulatory Visit: Payer: Self-pay | Admitting: Internal Medicine

## 2015-03-04 DIAGNOSIS — H01002 Unspecified blepharitis right lower eyelid: Secondary | ICD-10-CM | POA: Diagnosis not present

## 2015-03-04 DIAGNOSIS — H01004 Unspecified blepharitis left upper eyelid: Secondary | ICD-10-CM | POA: Diagnosis not present

## 2015-03-04 DIAGNOSIS — H11122 Conjunctival concretions, left eye: Secondary | ICD-10-CM | POA: Diagnosis not present

## 2015-03-04 DIAGNOSIS — H01001 Unspecified blepharitis right upper eyelid: Secondary | ICD-10-CM | POA: Diagnosis not present

## 2015-03-04 DIAGNOSIS — H01005 Unspecified blepharitis left lower eyelid: Secondary | ICD-10-CM | POA: Diagnosis not present

## 2015-06-16 ENCOUNTER — Other Ambulatory Visit: Payer: Self-pay | Admitting: Internal Medicine

## 2015-06-17 ENCOUNTER — Encounter: Payer: Self-pay | Admitting: Internal Medicine

## 2015-06-17 ENCOUNTER — Ambulatory Visit (INDEPENDENT_AMBULATORY_CARE_PROVIDER_SITE_OTHER): Payer: Medicare Other | Admitting: Internal Medicine

## 2015-06-17 VITALS — BP 118/74 | HR 79 | Temp 97.4°F | Ht 69.0 in | Wt 218.1 lb

## 2015-06-17 DIAGNOSIS — E119 Type 2 diabetes mellitus without complications: Secondary | ICD-10-CM

## 2015-06-17 DIAGNOSIS — Z1159 Encounter for screening for other viral diseases: Secondary | ICD-10-CM

## 2015-06-17 DIAGNOSIS — E785 Hyperlipidemia, unspecified: Secondary | ICD-10-CM

## 2015-06-17 DIAGNOSIS — E1159 Type 2 diabetes mellitus with other circulatory complications: Secondary | ICD-10-CM

## 2015-06-17 LAB — BASIC METABOLIC PANEL
BUN: 20 mg/dL (ref 6–23)
CHLORIDE: 102 meq/L (ref 96–112)
CO2: 30 mEq/L (ref 19–32)
CREATININE: 1.06 mg/dL (ref 0.40–1.50)
Calcium: 9.7 mg/dL (ref 8.4–10.5)
GFR: 73.75 mL/min (ref >60.00)
Glucose, Bld: 99 mg/dL (ref 70–99)
POTASSIUM: 4.3 meq/L (ref 3.5–5.1)
Sodium: 139 mEq/L (ref 135–145)

## 2015-06-17 LAB — HEMOGLOBIN A1C: Hgb A1c MFr Bld: 6 % (ref 4.6–6.5)

## 2015-06-17 LAB — AST: AST: 24 U/L (ref 0–37)

## 2015-06-17 LAB — ALT: ALT: 14 U/L (ref 0–53)

## 2015-06-17 LAB — HM DIABETES FOOT EXAM: HM DIABETIC FOOT EXAM: NORMAL

## 2015-06-17 NOTE — Progress Notes (Signed)
Subjective:    Patient ID: Harry Baker, male    DOB: Apr 28, 1946, 68 y.o.   MRN: [id number]  DOS:  06/17/2015 Type of visit - description :  Interval history: Diabetes: Good compliance of medication, no recent ambulatory CBGs, in the last 4 weeks has improved his diet and lost some weight.  Wt Readings from Last 3 Encounters:  06/17/15 218 lb 2 oz (98.941 kg)  12/17/14 220 lb 4 oz (99.905 kg)  08/22/14 203 lb (92.08 kg)   6 months ago noted a couple of bumps on the left foot, no symptoms at all.  Has questions about prostate cancer screening  Review of Systems In general feeling well. Denies lower extremity edema, lower extremity paresthesias or numbness  Past Medical History  Diagnosis Date  . CAD (coronary artery disease)     s/p stent 6/08(-) myoview, 8/11 negative stress test  . Diabetes mellitus   . Hyperlipidemia   . Diverticulosis of colon   . Hemorrhoid     problems off and on  . Vocal cord polyp   . Herniated disc     sess chiropractor 2010  . GERD   . ERECTILE DYSFUNCTION   . HOH (hard of hearing)     Past Surgical History  Procedure Laterality Date  . Tonsillectomy    . Vc cyst removal    . Mouth surgery  2010    Social History   Social History  . Marital Status: Married    Spouse Name: N/A  . Number of Children: 1  . Years of Education: N/A   Occupational History  . 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 Smoking status: Light Tobacco Smoker    Types: Cigars  . Smokeless tobacco: Never Used     Comment: occ cigar  . Alcohol Use: Yes     Comment: wine socially  . Drug Use: No  . Sexual Activity: Not on file   Other Topics Concern  . 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  Always use your most recent med list.               aspirin 325 MG tablet  Take 325 mg by mouth  daily.     atorvastatin 40 MG tablet  Commonly known as:  LIPITOR  Take 1 tablet (40 mg total) by mouth daily.     co-enzyme Q-10 30 MG capsule  Take 30 mg by mouth daily.     ezetimibe 10 MG tablet  Commonly known as:  ZETIA  Take 1 tablet (10 mg total) by mouth daily.     fish oil-omega-3 fatty acids 1000 MG capsule  Take 4 g by mouth daily.     GLUCOSAMINE-CHONDROITIN PO  Take 1 tablet by mouth daily.     glucose blood test strip  Use as instructed     Lancets 30G Misc  Use as directed     metFORMIN 850 MG tablet  Commonly known as:  GLUCOPHAGE  TAKE 1 TABLET BY MOUTH TWICE DAILY WITH A MEAL     multivitamin capsule  Take 1 capsule by mouth daily.     niacin 1000 MG CR tablet  Commonly known as:  NIASPAN  Take  1 tablet (1,000 mg total) by mouth daily.     nitroGLYCERIN 0.4 MG SL tablet  Commonly known as:  NITROSTAT  Place 1 tablet (0.4 mg total) under the tongue every 5 (five) minutes as needed.     ramipril 5 MG capsule  Commonly known as:  ALTACE  Take 1 capsule (5 mg total) by mouth daily.     ranitidine 150 MG tablet  Commonly known as:  ZANTAC  Take 1 tablet (150 mg total) by mouth 2 (two) times daily.           Objective:   Physical Exam  Musculoskeletal:       Feet:   BP 118/74 mmHg  Pulse 79  Temp(Src) 97.4 F (36.3 C) (Oral)  Ht 5\' 9"  (1.753 m)  Wt 218 lb 2 oz (98.941 kg)  BMI 32.20 kg/m2  SpO2 99% General:   Well developed, well nourished . NAD.  HEENT:  Normocephalic . Face symmetric, atraumatic Lungs:  CTA B Normal respiratory effort, no intercostal retractions, no accessory muscle use. Heart: RRR,  no murmur.  No pretibial edema bilaterally  Diabetic foot exam: No edema, and normal skin, pinprick examination normal Neurologic:  alert & oriented X3.  Speech normal, gait appropriate for age and unassisted Psych--  Cognition and judgment appear intact.  Cooperative with normal attention span and concentration.  Behavior  appropriate. No anxious or depressed appearing.      Assessment & Plan:  Hyperlipidemia: Under excellent control  Left foot lump: Likely a benign condition, cyst? Offered referral to orthopedic surgery for possible excision as the only way to be 100% sure of what it is, we elected observation for now, will call if the area grows or cause discomfort

## 2015-06-17 NOTE — Assessment & Plan Note (Signed)
Diabetic feet exam normal, check a BMP, LFTs, A1c. Encourage to continue improving his diet, he did very well last year. he is getting back on track

## 2015-06-17 NOTE — Progress Notes (Signed)
Pre visit review using our clinic review tool, if applicable. No additional management support is needed unless otherwise documented below in the visit note. 

## 2015-06-17 NOTE — Patient Instructions (Signed)
Please go to the lab   Next visit  for a   complete physical exam in 6 months Please schedule an appointment at the front desk Please come back fasting

## 2015-06-18 LAB — HEPATITIS C ANTIBODY: HCV AB: NEGATIVE

## 2015-07-16 DIAGNOSIS — H11122 Conjunctival concretions, left eye: Secondary | ICD-10-CM | POA: Diagnosis not present

## 2015-07-16 DIAGNOSIS — H25043 Posterior subcapsular polar age-related cataract, bilateral: Secondary | ICD-10-CM | POA: Diagnosis not present

## 2015-07-16 DIAGNOSIS — H11153 Pinguecula, bilateral: Secondary | ICD-10-CM | POA: Diagnosis not present

## 2015-07-16 DIAGNOSIS — H2513 Age-related nuclear cataract, bilateral: Secondary | ICD-10-CM | POA: Diagnosis not present

## 2015-07-16 DIAGNOSIS — E119 Type 2 diabetes mellitus without complications: Secondary | ICD-10-CM | POA: Diagnosis not present

## 2015-07-16 DIAGNOSIS — H01001 Unspecified blepharitis right upper eyelid: Secondary | ICD-10-CM | POA: Diagnosis not present

## 2015-07-16 DIAGNOSIS — H5213 Myopia, bilateral: Secondary | ICD-10-CM | POA: Diagnosis not present

## 2015-07-22 ENCOUNTER — Ambulatory Visit (INDEPENDENT_AMBULATORY_CARE_PROVIDER_SITE_OTHER): Payer: Medicare Other

## 2015-07-22 ENCOUNTER — Encounter (INDEPENDENT_AMBULATORY_CARE_PROVIDER_SITE_OTHER): Payer: Self-pay

## 2015-07-22 DIAGNOSIS — Z23 Encounter for immunization: Secondary | ICD-10-CM | POA: Diagnosis not present

## 2015-07-22 NOTE — Progress Notes (Signed)
Pre visit review using our clinic review tool, if applicable. No additional management support is needed unless otherwise documented below in the visit note. 

## 2015-08-18 ENCOUNTER — Other Ambulatory Visit: Payer: Self-pay | Admitting: Internal Medicine

## 2015-08-19 ENCOUNTER — Other Ambulatory Visit: Payer: Self-pay | Admitting: Medical

## 2015-08-19 ENCOUNTER — Ambulatory Visit (HOSPITAL_BASED_OUTPATIENT_CLINIC_OR_DEPARTMENT_OTHER)
Admission: RE | Admit: 2015-08-19 | Discharge: 2015-08-19 | Disposition: A | Discharge status: Home / Self Care | Payer: Medicare Other | Source: Ambulatory Visit | Attending: Medical | Admitting: Medical

## 2015-08-19 ENCOUNTER — Encounter: Payer: Self-pay | Admitting: Medical

## 2015-08-19 ENCOUNTER — Ambulatory Visit (INDEPENDENT_AMBULATORY_CARE_PROVIDER_SITE_OTHER): Payer: Medicare Other | Admitting: Medical

## 2015-08-19 VITALS — BP 110/70 | HR 77 | Temp 98.2°F | Ht 68.5 in | Wt 220.0 lb

## 2015-08-19 DIAGNOSIS — M25551 Pain in right hip: Secondary | ICD-10-CM | POA: Diagnosis not present

## 2015-08-19 DIAGNOSIS — M2550 Pain in unspecified joint: Secondary | ICD-10-CM

## 2015-08-19 DIAGNOSIS — M25552 Pain in left hip: Secondary | ICD-10-CM | POA: Diagnosis not present

## 2015-08-19 DIAGNOSIS — R5383 Other fatigue: Secondary | ICD-10-CM | POA: Diagnosis not present

## 2015-08-19 DIAGNOSIS — T148 Other injury of unspecified body region: Secondary | ICD-10-CM | POA: Diagnosis not present

## 2015-08-19 DIAGNOSIS — W57XXXA Bitten or stung by nonvenomous insect and other nonvenomous arthropods, initial encounter: Secondary | ICD-10-CM

## 2015-08-19 LAB — CBC WITH DIFFERENTIAL/PLATELET
BASOS ABS: 0.1 10*3/uL (ref 0.0–0.1)
Basophils Relative: 1.1 % (ref 0.0–3.0)
EOS ABS: 0.2 10*3/uL (ref 0.0–0.7)
Eosinophils Relative: 2.7 % (ref 0.0–5.0)
HCT: 43.8 % (ref 39.0–52.0)
Hemoglobin: 14.6 g/dL (ref 13.0–17.0)
LYMPHS ABS: 1.4 10*3/uL (ref 0.7–4.0)
Lymphocytes Relative: 20.8 % (ref 12.0–46.0)
MCHC: 33.3 g/dL (ref 30.0–36.0)
MCV: 94.3 fl (ref 78.0–100.0)
Monocytes Absolute: 0.8 10*3/uL (ref 0.1–1.0)
Monocytes Relative: 11.6 % (ref 3.0–12.0)
NEUTROS ABS: 4.3 10*3/uL (ref 1.4–7.7)
NEUTROS PCT: 63.8 % (ref 43.0–77.0)
PLATELETS: 255 10*3/uL (ref 150.0–400.0)
RBC: 4.64 Mil/uL (ref 4.22–5.81)
RDW: 13.9 % (ref 11.5–15.5)
WBC: 6.8 10*3/uL (ref 4.0–10.5)

## 2015-08-19 MED ORDER — DOXYCYCLINE HYCLATE 100 MG PO TABS: 100.0000 mg | ORAL_TABLET | Freq: Two times a day (BID) | ORAL | Status: DC

## 2015-08-19 NOTE — Patient Instructions (Addendum)
For tick bite and other symptoms I will  go ahead and rx doxycycline antibiotic.  For the fatigue will get cbc. And tick bite studies as well.  For your hip pain will get xray of the hip.  Follow up 2 wks or as needed

## 2015-08-19 NOTE — Progress Notes (Signed)
Subjective:    Patient ID: Harry Baker, male    DOB: Dec 24, 1945, 69 y.o.   MRN: [id number]  HPI   Pt got by tick by tick. He actually states several time over past year. Pt has a farm. About one week ago he saw small mark on his neck. Pt speculates that he scratched the area at night. The next morning he saw swollen area. Pt states he has some fatigue since then. 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 Hx of findings ticks on him in the past. Pt felt some recent mild hip pain over past week. No hx of hip pain in the past.     Review of Systems  Constitutional: Positive for fatigue. Negative for chills.  Respiratory: Negative for cough, chest tightness, shortness of breath and wheezing.   Cardiovascular: Negative for chest pain and palpitations.  Gastrointestinal: Negative for diarrhea, constipation, abdominal distention and anal bleeding.  Genitourinary: Negative for dysuria and flank pain.  Musculoskeletal: Positive for arthralgias.  Neurological: Negative for dizziness and headaches.  Hematological: Negative for adenopathy. Does not bruise/bleed easily.  Psychiatric/Behavioral: Negative for behavioral problems and confusion.    Past Medical History  Diagnosis Date  . CAD (coronary artery disease)     s/p stent 6/08(-) myoview, 8/11 negative stress test  . Diabetes mellitus   . Hyperlipidemia   . Diverticulosis of colon   . Hemorrhoid     problems off and on  . Vocal cord polyp   . Herniated disc     sess chiropractor 2010  . GERD   . ERECTILE DYSFUNCTION   . HOH (hard of hearing)     Social History   Social History  . Marital Status: Married    Spouse Name: N/A  . Number of Children: 1  . Years of Education: N/A   Occupational History  . 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 Smoking status: Light Tobacco Smoker    Types: Cigars  . Smokeless tobacco: Never Used     Comment: occ cigar  . Alcohol Use: Yes     Comment: wine socially  . Drug Use: No  .  Sexual Activity: Not on file   Other Topics Concern  . 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 Tonsillectomy    . Vc cyst removal    . Mouth surgery  2010    Family History  Problem Relation Age of Onset  . Prostate cancer Other     uncle  . Lung cancer Brother   . Colon cancer Other     great aunt  . Heart failure Mother   . Heart attack Other     GF  . Stroke Father     No Known Allergies  Current Outpatient Prescriptions on File Prior to Visit  Medication Sig Dispense Refill  . aspirin 325 MG tablet Take 325 mg by mouth daily.      Marland Kitchen atorvastatin (LIPITOR) 40 MG tablet Take 1 tablet (40 mg total) by mouth daily. 90 tablet 1  . co-enzyme Q-10 30 MG capsule Take 30 mg by mouth daily.     Marland Kitchen ezetimibe (ZETIA) 10 MG tablet Take 1 tablet (10 mg total) by mouth daily. 90 tablet 1  . fish oil-omega-3 fatty acids 1000 MG capsule Take 4 g by  mouth daily.     Marland Kitchen GLUCOSAMINE-CHONDROITIN PO Take 1 tablet by mouth daily.    Marland Kitchen glucose blood test strip Use as instructed 100 each 12  . Lancets 30G MISC Use as directed 100 each 12  . metFORMIN (GLUCOPHAGE) 850 MG tablet Take 1 tablet (850 mg total) by mouth 2 (two) times daily with a meal. 60 tablet 8  . Multiple Vitamin (MULTIVITAMIN) capsule Take 1 capsule by mouth daily.      . ramipril (ALTACE) 5 MG capsule Take 1 capsule (5 mg total) by mouth daily. 90 capsule 1   No current facility-administered medications on file prior to visit.    BP 110/70 mmHg  Pulse 77  Temp(Src) 98.2 F (36.8 C) (Oral)  Ht 5' 8.5" (1.74 m)  Wt 220 lb (99.791 kg)  BMI 32.96 kg/m2  SpO2 98%       Objective:   Physical Exam  General- No acute distress. Pleasant patient. Neck- Full range of motion, no jvd Lungs- Clear, even and unlabored. Heart- regular rate and rhythm. Neurologic- CNII- XII grossly intact. Skin- small scab rt side  of neck. Took small scab off. Did not see tick head.  Rt hip- no pain on rom. But pain directly over trochanter.      Assessment & Plan:  For tick bite and other symptoms I will  go ahead and rx doxycycline antibiotic.  For the fatigue will get cbc.And tick bite studies as well.  For your hip pain will get xray of the hip.  Follow up 2 wks or as needed

## 2015-08-19 NOTE — Progress Notes (Signed)
Pre visit review using our clinic review tool, if applicable. No additional management support is needed unless otherwise documented below in the visit note. 

## 2015-08-20 LAB — ROCKY MTN SPOTTED FVR ABS PNL(IGG+IGM)
RMSF IGG: 0.4 IV
RMSF IgM: 0.18 IV

## 2015-08-21 LAB — LYME ABY, WSTRN BLT IGG & IGM W/BANDS
B BURGDORFERI IGM ABS (IB): NEGATIVE
B burgdorferi IgG Abs (IB): NEGATIVE
LYME DISEASE 18 KD IGG: NONREACTIVE
LYME DISEASE 28 KD IGG: NONREACTIVE
LYME DISEASE 39 KD IGM: NONREACTIVE
LYME DISEASE 41 KD IGM: NONREACTIVE
LYME DISEASE 45 KD IGG: NONREACTIVE
LYME DISEASE 58 KD IGG: NONREACTIVE
LYME DISEASE 93 KD IGG: NONREACTIVE
Lyme Disease 23 kD IgG: NONREACTIVE
Lyme Disease 23 kD IgM: NONREACTIVE
Lyme Disease 30 kD IgG: NONREACTIVE
Lyme Disease 39 kD IgG: NONREACTIVE
Lyme Disease 41 kD IgG: NONREACTIVE
Lyme Disease 66 kD IgG: NONREACTIVE

## 2015-08-28 NOTE — Progress Notes (Signed)
HPI: FU coronary artery disease with prior PCI of his right coronary artery in August 2000. He also had residual LAD disease at that time. He had an abdominal ultrasound in July 2007 that showed no aneurysm. He has also had previous carotid Dopplers performed on September 24, 2005, which showed normal carotid arteries. His most recent Myoview in August of 2014 showed no ischemia or infarction and ejection fraction of 62%. Since I last saw him, the patient denies any dyspnea on exertion, orthopnea, PND, pedal edema, palpitations, syncope or chest pain.   Current Outpatient Prescriptions  Medication Sig Dispense Refill  . aspirin 325 MG tablet Take 325 mg by mouth daily.      Marland Kitchen atorvastatin (LIPITOR) 40 MG tablet Take 1 tablet (40 mg total) by mouth daily. 90 tablet 1  . co-enzyme Q-10 30 MG capsule Take 30 mg by mouth daily.     Marland Kitchen doxycycline (VIBRA-TABS) 100 MG tablet Take 1 tablet (100 mg total) by mouth 2 (two) times daily. 20 tablet 0  . ezetimibe (ZETIA) 10 MG tablet Take 1 tablet (10 mg total) by mouth daily. 90 tablet 1  . fish oil-omega-3 fatty acids 1000 MG capsule Take 4 g by mouth daily.     Marland Kitchen GLUCOSAMINE-CHONDROITIN PO Take 1 tablet by mouth daily.    Marland Kitchen glucose blood test strip Use as instructed 100 each 12  . Lancets 30G MISC Use as directed 100 each 12  . metFORMIN (GLUCOPHAGE) 850 MG tablet Take 1 tablet (850 mg total) by mouth 2 (two) times daily with a meal. 60 tablet 8  . Multiple Vitamin (MULTIVITAMIN) capsule Take 1 capsule by mouth daily.      . nitroGLYCERIN (NITROSTAT) 0.4 MG SL tablet Place 0.4 mg under the tongue every 5 (five) minutes as needed for chest pain. Place one tablet (0.4mg  total), under tongue every 5 (five) minutes as needed.    . ramipril (ALTACE) 5 MG capsule Take 1 capsule (5 mg total) by mouth daily. 90 capsule 1   No current facility-administered medications for this visit.     Past Medical History  Diagnosis Date  . CAD (coronary artery  disease)     s/p stent 6/08(-) myoview, 8/11 negative stress test  . Diabetes mellitus   . Hyperlipidemia   . Diverticulosis of colon   . Hemorrhoid     problems off and on  . Vocal cord polyp   . Herniated disc     sess chiropractor 2010  . GERD   . ERECTILE DYSFUNCTION   . HOH (hard of hearing)     Past Surgical History  Procedure Laterality Date  . Tonsillectomy    . Vc cyst removal    . Mouth surgery  2010    Social History   Social History  . Marital Status: Married    Spouse Name: N/A  . Number of Children: 1  . Years of Education: N/A   Occupational History  . 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 Smoking status: Light Tobacco Smoker    Types: Cigars  . Smokeless tobacco: Never Used     Comment: occ cigar  . Alcohol Use: Yes     Comment: wine socially  . Drug Use: No  . Sexual Activity: Not on file   Other Topics Concern  . 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 Remaining systems are negative.  Physical Exam: Well-developed well-nourished in no acute distress.  Skin is warm and dry.  HEENT is normal.  Neck is supple.  Chest is clear to auscultation with normal expansion.  Cardiovascular exam is regular rate and rhythm.  Abdominal exam nontender or distended. No masses palpated. Extremities show no edema. neuro grossly intact  ECG Sinus rhythm at a rate of 72. No ST changes.

## 2015-09-04 ENCOUNTER — Ambulatory Visit (INDEPENDENT_AMBULATORY_CARE_PROVIDER_SITE_OTHER): Payer: Medicare Other | Admitting: Cardiology

## 2015-09-04 ENCOUNTER — Encounter: Payer: Self-pay | Admitting: Cardiology

## 2015-09-04 VITALS — BP 116/75 | HR 72 | Ht 68.5 in | Wt 214.8 lb

## 2015-09-04 DIAGNOSIS — E785 Hyperlipidemia, unspecified: Secondary | ICD-10-CM

## 2015-09-04 DIAGNOSIS — I2510 Atherosclerotic heart disease of native coronary artery without angina pectoris: Secondary | ICD-10-CM

## 2015-09-04 MED ORDER — ATORVASTATIN CALCIUM 80 MG PO TABS: 80.0000 mg | ORAL_TABLET | Freq: Every day | ORAL | Status: DC

## 2015-09-04 NOTE — Patient Instructions (Signed)
Medication Instructions:   STOP ZETIA  STOP NIASPAN  INCREASE ATORVASTATIN TO 80 MG ONCE DAILY= 2 OF THE 40 MG TABLETS ONCE DAILY  Labwork:  Your physician recommends that you return for lab work in: 4 WEEKS= DO NOT EAT PRIOR TO LAB WORK  Follow-Up:  Your physician wants you to follow-up in: [place] will receive a reminder letter in the mail two months in advance. If you don't receive a letter, please call our office to schedule the follow-up appointment.   If you need a refill on your cardiac medications before your next appointment, please call your pharmacy.

## 2015-09-04 NOTE — Assessment & Plan Note (Signed)
Change Lipitor to 80 mg daily. Discontinue his LDL. Discontinue Niaspan. Check lipids and liver in 4 weeks.

## 2015-09-04 NOTE — Assessment & Plan Note (Signed)
Continue aspirin and statin. 

## 2015-10-09 ENCOUNTER — Other Ambulatory Visit: Payer: Medicare Other

## 2015-10-09 ENCOUNTER — Encounter: Payer: Self-pay | Admitting: Cardiology

## 2015-10-23 ENCOUNTER — Encounter: Payer: Self-pay | Admitting: *Deleted

## 2015-11-05 ENCOUNTER — Other Ambulatory Visit: Payer: Self-pay | Admitting: Internal Medicine

## 2015-12-12 DIAGNOSIS — I2510 Atherosclerotic heart disease of native coronary artery without angina pectoris: Secondary | ICD-10-CM | POA: Diagnosis not present

## 2015-12-13 LAB — HEPATIC FUNCTION PANEL
ALK PHOS: 58 U/L (ref 40–115)
ALT: 19 U/L (ref 9–46)
AST: 24 U/L (ref 10–35)
Albumin: 4.2 g/dL (ref 3.6–5.1)
BILIRUBIN DIRECT: 0.2 mg/dL (ref <=0.2)
BILIRUBIN INDIRECT: 0.7 mg/dL (ref 0.2–1.2)
TOTAL PROTEIN: 6.7 g/dL (ref 6.1–8.1)
Total Bilirubin: 0.9 mg/dL (ref 0.2–1.2)

## 2015-12-13 LAB — LIPID PANEL
Cholesterol: 162 mg/dL (ref 125–200)
HDL: 46 mg/dL (ref >=40)
LDL CALC: 85 mg/dL (ref <130)
TRIGLYCERIDES: 156 mg/dL — AB (ref <150)
Total CHOL/HDL Ratio: 3.5 Ratio (ref <=5.0)
VLDL: 31 mg/dL — ABNORMAL HIGH (ref <30)

## 2016-01-01 ENCOUNTER — Telehealth: Payer: Self-pay | Admitting: *Deleted

## 2016-01-01 NOTE — Telephone Encounter (Signed)
Unable to reach patient at time of pre-visit call. Left message for patient to return call when available.  

## 2016-01-02 ENCOUNTER — Ambulatory Visit (INDEPENDENT_AMBULATORY_CARE_PROVIDER_SITE_OTHER): Payer: Medicare Other | Admitting: Internal Medicine

## 2016-01-02 ENCOUNTER — Encounter: Payer: Self-pay | Admitting: Internal Medicine

## 2016-01-02 VITALS — BP 116/74 | HR 80 | Temp 98.0°F | Ht 69.0 in | Wt 235.4 lb

## 2016-01-02 DIAGNOSIS — Z09 Encounter for follow-up examination after completed treatment for conditions other than malignant neoplasm: Secondary | ICD-10-CM | POA: Diagnosis not present

## 2016-01-02 DIAGNOSIS — I10 Essential (primary) hypertension: Secondary | ICD-10-CM

## 2016-01-02 DIAGNOSIS — E119 Type 2 diabetes mellitus without complications: Secondary | ICD-10-CM

## 2016-01-02 DIAGNOSIS — Z0000 Encounter for general adult medical examination without abnormal findings: Secondary | ICD-10-CM

## 2016-01-02 LAB — BASIC METABOLIC PANEL
BUN: 15 mg/dL (ref 6–23)
CO2: 31 mEq/L (ref 19–32)
CREATININE: 0.89 mg/dL (ref 0.40–1.50)
Calcium: 9.3 mg/dL (ref 8.4–10.5)
Chloride: 104 mEq/L (ref 96–112)
GFR: 90.08 mL/min (ref >60.00)
GLUCOSE: 120 mg/dL — AB (ref 70–99)
POTASSIUM: 4.2 meq/L (ref 3.5–5.1)
Sodium: 140 mEq/L (ref 135–145)

## 2016-01-02 LAB — HEMOGLOBIN A1C: Hgb A1c MFr Bld: 6.6 % — ABNORMAL HIGH (ref 4.6–6.5)

## 2016-01-02 NOTE — Progress Notes (Signed)
Subjective:    Patient ID: Harry Baker, male    DOB: Sep 28, 1946, 70 y.o.   MRN: [id number]  DOS:  01/02/2016 Type of visit - description :   Here for Medicare AWV:   1. Risk factors based on Past M, S, F history: reviewed   2. Physical Activities: active, has a farm  3. Depression/mood: (-) screening   4. Hearing: poor hearing, has a hearing aid R and implant on the L, sees hearing doctors 5. ADL's: Independent   6. Fall Risk: low risk, see instructions   7. home Safety: does feel safe at home   8. Height, weight, &visual acuity: see VS, vision corrected, h/o cataracts, sees eye MD q year 9. Counseling: provided   10. Labs ordered based on risk factors: if needed   11. Referral Coordination: if needed   12. Care Plan, see assessment and plan  , written plan provided 13. Cognitive Assessment: Cognition intact, motor skills normal.   14. Care team updated 15. End-of-life care discussed : has a living will , is a  organ donor   Issues: DM: Not doing very well with diet, good compliance with metformin, no ambulatory CBGs High cholesterol: Good medication compliance with Lipitor HTN: On Altace, no ambulatory BPs, BP today is excellent  Review of Systems  Constitutional: No fever. No chills. No unexplained wt changes. No unusual sweats  HEENT: No dental problems, no ear discharge, no facial swelling, no voice changes. Last week, had some eye irritation after he worked in the yard, symptoms resolved.  Respiratory: No wheezing , no  difficulty breathing. No cough , no mucus production  Cardiovascular: No CP, no leg swelling , no  Palpitations  GI: no nausea, no vomiting, no diarrhea , no  abdominal pain.  No blood in the stools. No dysphagia, no odynophagia    Endocrine: No polyphagia, no polyuria , no polydipsia  GU: No dysuria, gross hematuria, difficulty urinating. No urinary urgency, no frequency.  Musculoskeletal: No joint swellings or unusual aches or pains  Skin:  No change in the color of the skin, palor , no  Rash  Allergic, immunologic: No environmental allergies , no  food allergies  Neurological: No dizziness no  syncope. No headaches. No diplopia, no slurred, no slurred speech, no motor deficits, no facial  Numbness  Hematological: No enlarged lymph nodes, no easy bruising , no unusual bleedings  Psychiatry: No suicidal ideas, no hallucinations, no beavior problems, no confusion.  No unusual/severe anxiety, no depression  Past Medical History  Diagnosis Date  . CAD (coronary artery disease)     s/p stent 6/08(-) myoview, 8/11 negative stress test  . Diabetes mellitus   . Hyperlipidemia   . Diverticulosis of colon   . Hemorrhoid     problems off and on  . Vocal cord polyp   . Herniated disc     sess chiropractor 2010  . GERD   . ERECTILE DYSFUNCTION   . HOH (hard of hearing)     Past Surgical History  Procedure Laterality Date  . Tonsillectomy    . Vc cyst removal    . Mouth surgery  2010    Social History   Social History  . Marital Status: Married    Spouse Name: N/A  . Number of Children: 1  . Years of Education: N/A   Occupational History  . 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 Smoking status: Light Tobacco Smoker  Types: Cigars  . Smokeless tobacco: Never Used     Comment: occ cigar  . Alcohol Use: Yes     Comment: wine socially  . Drug Use: No  . Sexual Activity: Not on file   Other Topics Concern  . 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 Prostate cancer Other     uncle  . Lung cancer Brother   . Colon cancer Other     great aunt  . Heart failure Mother   . Heart attack Other     GF  . Stroke Father   . Diabetes Sister        Medication List       This list is accurate as of: 01/02/16 11:59 PM.  Always use your most recent med list.                aspirin 325 MG tablet  Take 325 mg by mouth daily.     atorvastatin 80 MG tablet  Commonly known as:  LIPITOR  Take 1 tablet (80 mg total) by mouth daily.     co-enzyme Q-10 30 MG capsule  Take 30 mg by mouth daily.     fish oil-omega-3 fatty acids 1000 MG capsule  Take 4 g by mouth daily.     GLUCOSAMINE-CHONDROITIN PO  Take 1 tablet by mouth daily.     glucose blood test strip  Use as instructed     Lancets 30G Misc  Use as directed     metFORMIN 850 MG tablet  Commonly known as:  GLUCOPHAGE  Take 1 tablet (850 mg total) by mouth 2 (two) times daily with a meal.     multivitamin capsule  Take 1 capsule by mouth daily.     nitroGLYCERIN 0.4 MG SL tablet  Commonly known as:  NITROSTAT  Place 0.4 mg under the tongue every 5 (five) minutes as needed for chest pain. Reported on 01/02/2016     ramipril 5 MG capsule  Commonly known as:  ALTACE  Take 1 capsule (5 mg total) by mouth daily.           Objective:   Physical Exam BP 116/74 mmHg  Pulse 80  Temp(Src) 98 F (36.7 C) (Oral)  Ht 5\' 9"  (1.753 m)  Wt 235 lb 6 oz (106.765 kg)  BMI 34.74 kg/m2  SpO2 96%  General:   Well developed, well nourished . NAD.  Neck: No  Thyromegaly  HEENT:  Normocephalic . Face symmetric, atraumatic Lungs:  CTA B Normal respiratory effort, no intercostal retractions, no accessory muscle use. Heart: RRR,  no murmur.  No pretibial edema bilaterally  Abdomen:  Not distended, soft, non-tender. No rebound or rigidity.   Skin: Exposed areas without rash. Not pale. Not jaundice Neurologic:  alert & oriented X3.  Speech normal, gait appropriate for age and unassisted Strength symmetric and appropriate for age.  Psych: Cognition and judgment appear intact.  Cooperative with normal attention span and concentration.  Behavior appropriate. No anxious or depressed appearing.    Assessment & Plan:   Assessment DM Hyperlipidemia CAD, stent 2008, negative stress test  2011 GERD ED HOH  PLAN: DM: On metformin, last A1c 6.1, diet lately has not been the best but plans to change that. Hyperlipidemia:   Cardiology discontinue Zetia and Niaspan, subsequent FLP showed  a LDL in the 80s.  HTN: on altace, check a BMP RTC  6 months

## 2016-01-02 NOTE — Patient Instructions (Signed)
GO TO THE LAB :      Get the blood work     GO TO THE FRONT DESK Schedule your next appointment for a  Routine check When?   6 months  Fasting?  No       Fall Prevention and Home Safety Falls cause injuries and can affect all age groups. It is possible to use preventive measures to significantly decrease the likelihood of falls. There are many simple measures which can make your home safer and prevent falls. OUTDOORS  Repair cracks and edges of walkways and driveways.  Remove high doorway thresholds.  Trim shrubbery on the main path into your home.  Have good outside lighting.  Clear walkways of tools, rocks, debris, and clutter.  Check that handrails are not broken and are securely fastened. Both sides of steps should have handrails.  Have leaves, snow, and ice cleared regularly.  Use sand or salt on walkways during winter months.  In the garage, clean up grease or oil spills. BATHROOM  Install night lights.  Install grab bars by the toilet and in the tub and shower.  Use non-skid mats or decals in the tub or shower.  Place a plastic non-slip stool in the shower to sit on, if needed.  Keep floors dry and clean up all water on the floor immediately.  Remove soap buildup in the tub or shower on a regular basis.  Secure bath mats with non-slip, double-sided rug tape.  Remove throw rugs and tripping hazards from the floors. BEDROOMS  Install night lights.  Make sure a bedside light is easy to reach.  Do not use oversized bedding.  Keep a telephone by your bedside.  Have a firm chair with side arms to use for getting dressed.  Remove throw rugs and tripping hazards from the floor. KITCHEN  Keep handles on pots and pans turned toward the center of the stove. Use back burners when possible.  Clean up spills quickly and allow time for drying.  Avoid walking on wet floors.  Avoid hot utensils and knives.  Position shelves so they are not too high or  low.  Place commonly used objects within easy reach.  If necessary, use a sturdy step stool with a grab bar when reaching.  Keep electrical cables out of the way.  Do not use floor polish or wax that makes floors slippery. If you must use wax, use non-skid floor wax.  Remove throw rugs and tripping hazards from the floor. STAIRWAYS  Never leave objects on stairs.  Place handrails on both sides of stairways and use them. Fix any loose handrails. Make sure handrails on both sides of the stairways are as long as the stairs.  Check carpeting to make sure it is firmly attached along stairs. Make repairs to worn or loose carpet promptly.  Avoid placing throw rugs at the top or bottom of stairways, or properly secure the rug with carpet tape to prevent slippage. Get rid of throw rugs, if possible.  Have an electrician put in a light switch at the top and bottom of the stairs. OTHER FALL PREVENTION TIPS  Wear low-heel or rubber-soled shoes that are supportive and fit well. Wear closed toe shoes.  When using a stepladder, make sure it is fully opened and both spreaders are firmly locked. Do not climb a closed stepladder.  Add color or contrast paint or tape to grab bars and handrails in your home. Place contrasting color strips on first and last steps.  Learn and use mobility aids as needed. Install an electrical emergency response system.  Turn on lights to avoid dark areas. Replace light bulbs that burn out immediately. Get light switches that glow.  Arrange furniture to create clear pathways. Keep furniture in the same place.  Firmly attach carpet with non-skid or double-sided tape.  Eliminate uneven floor surfaces.  Select a carpet pattern that does not visually hide the edge of steps.  Be aware of all pets. OTHER HOME SAFETY TIPS  Set the water temperature for 120 F (48.8 C).  Keep emergency numbers on or near the telephone.  Keep smoke detectors on every level of the  home and near sleeping areas. Document Released: 09/25/2002 Document Revised: 04/05/2012 Document Reviewed: 12/25/2011 [facility] Patient Information 2015 [place], [place]. This information is not intended to replace advice given to you by your health care provider. Make sure you discuss any questions you have with your health care provider.   Preventive Care for Adults Ages 23 and over  Blood pressure check.** / Every 1 to 2 years.  Lipid and cholesterol check.**/ Every 5 years beginning at age 54.  Lung cancer screening. / Every year if you are aged 69-80 years and have a 30-pack-year history of smoking and currently smoke or have quit within the past 15 years. Yearly screening is stopped once you have quit smoking for at least 15 years or develop a health problem that would prevent you from having lung cancer treatment.  Fecal occult blood test (FOBT) of stool. / Every year beginning at age 40 and continuing until age 8. You may not have to do this test if you get a colonoscopy every 10 years.  Flexible sigmoidoscopy** or colonoscopy.** / Every 5 years for a flexible sigmoidoscopy or every 10 years for a colonoscopy beginning at age 75 and continuing until age 48.  Hepatitis C blood test.** / For all people born from [birth year] through 1964 and any individual with known risks for hepatitis C.  Abdominal aortic aneurysm (AAA) screening.** / A one-time screening for ages 81 to 65 years who are current or former smokers.  Skin self-exam. / Monthly.  Influenza vaccine. / Every year.  Tetanus, diphtheria, and acellular pertussis (Tdap/Td) vaccine.** / 1 dose of Td every 10 years.  Varicella vaccine.** / Consult your health care provider.  Zoster vaccine.** / 1 dose for adults aged 7 years or older.  Pneumococcal 13-valent conjugate (PCV13) vaccine.** / Consult your health care provider.  Pneumococcal polysaccharide (PPSV23) vaccine.** / 1 dose for all adults aged 35 years and  older.  Meningococcal vaccine.** / Consult your health care provider.  Hepatitis A vaccine.** / Consult your health care provider.  Hepatitis B vaccine.** / Consult your health care provider.  Haemophilus influenzae type b (Hib) vaccine.** / Consult your health care provider. **Family history and personal history of risk and conditions may change your health care provider's recommendations. Document Released: 12/01/2001 Document Revised: 10/10/2013 Document Reviewed: 03/02/2011 [facility] Patient Information 2015 [place], [place]. This information is not intended to replace advice given to you by your health care provider. Make sure you discuss any questions you have with your health care provider.

## 2016-01-02 NOTE — Assessment & Plan Note (Addendum)
Td 2013; Last Pneumovax 2013;Prevnar 11-2014 ;shingle shots 11-09  colonoscopy 2000 , 11-2009 ----> next 2021  DRE and PSA normal 2015  diet  and exercise-- active, diet needs improvement, pt plans to do better

## 2016-01-02 NOTE — Progress Notes (Signed)
Pre visit review using our clinic review tool, if applicable. No additional management support is needed unless otherwise documented below in the visit note. 

## 2016-01-03 DIAGNOSIS — Z09 Encounter for follow-up examination after completed treatment for conditions other than malignant neoplasm: Secondary | ICD-10-CM | POA: Insufficient documentation

## 2016-01-03 NOTE — Assessment & Plan Note (Signed)
DM: On metformin, last A1c 6.1, diet lately has not been the best but plans to change that. Hyperlipidemia:   Cardiology discontinue Zetia and Niaspan, subsequent FLP showed a LDL in the 80s.  HTN: on altace, check a BMP RTC  6 months

## 2016-03-27 ENCOUNTER — Other Ambulatory Visit: Payer: Self-pay | Admitting: Internal Medicine

## 2016-06-06 ENCOUNTER — Other Ambulatory Visit: Payer: Self-pay | Admitting: Internal Medicine

## 2016-06-08 ENCOUNTER — Telehealth: Payer: Self-pay | Admitting: Internal Medicine

## 2016-06-08 MED ORDER — METFORMIN HCL 850 MG PO TABS: 850.0000 mg | ORAL_TABLET | Freq: Two times a day (BID) | ORAL | 0 refills | Status: DC

## 2016-06-08 NOTE — Telephone Encounter (Signed)
Relation to [id number] Call back [phone] Pharmacy:  [facility] [place], [address] [phone] (Phone) [phone] (Fax)     Reason for call:  Patient requesting a 90 day supply metFORMIN (GLUCOPHAGE) 850 MG tablet

## 2016-06-08 NOTE — Telephone Encounter (Signed)
Rx sent 

## 2016-07-06 ENCOUNTER — Encounter: Payer: Self-pay | Admitting: Internal Medicine

## 2016-07-06 ENCOUNTER — Ambulatory Visit (INDEPENDENT_AMBULATORY_CARE_PROVIDER_SITE_OTHER): Payer: Medicare Other | Admitting: Internal Medicine

## 2016-07-06 VITALS — BP 118/72 | HR 78 | Temp 97.6°F | Resp 14 | Ht 69.0 in | Wt 226.1 lb

## 2016-07-06 DIAGNOSIS — R2242 Localized swelling, mass and lump, left lower limb: Secondary | ICD-10-CM | POA: Diagnosis not present

## 2016-07-06 DIAGNOSIS — Z23 Encounter for immunization: Secondary | ICD-10-CM | POA: Diagnosis not present

## 2016-07-06 DIAGNOSIS — E119 Type 2 diabetes mellitus without complications: Secondary | ICD-10-CM | POA: Diagnosis not present

## 2016-07-06 DIAGNOSIS — Z0000 Encounter for general adult medical examination without abnormal findings: Secondary | ICD-10-CM

## 2016-07-06 LAB — AST: AST: 31 U/L (ref 0–37)

## 2016-07-06 LAB — BASIC METABOLIC PANEL
BUN: 19 mg/dL (ref 6–23)
CALCIUM: 9.2 mg/dL (ref 8.4–10.5)
CO2: 28 mEq/L (ref 19–32)
Chloride: 106 mEq/L (ref 96–112)
Creatinine, Ser: 0.94 mg/dL (ref 0.40–1.50)
GFR: 84.45 mL/min (ref >60.00)
Glucose, Bld: 99 mg/dL (ref 70–99)
POTASSIUM: 4.2 meq/L (ref 3.5–5.1)
SODIUM: 140 meq/L (ref 135–145)

## 2016-07-06 LAB — HEMOGLOBIN A1C: Hgb A1c MFr Bld: 6.2 % (ref 4.6–6.5)

## 2016-07-06 LAB — ALT: ALT: 32 U/L (ref 0–53)

## 2016-07-06 MED ORDER — CLOBETASOL PROPIONATE 0.05 % EX OINT: 1.0000 "application " | TOPICAL_OINTMENT | Freq: Two times a day (BID) | CUTANEOUS | 0 refills | Status: DC

## 2016-07-06 NOTE — Assessment & Plan Note (Deleted)
DM: Doing great with diet in the last few weeks, check A1c, BMP, AST, ALT. CAD: Asx, controlling CV RF Poison ivy:  clobetasol cream helps marginally, change to clobetasol ointment. Avoidance discussed Foot mass: Patient reported it has increased in size lately, believed to be a cyst, refer to orthopedic surgery. Primary care: Flu shot  RTC, CPX, 6 months

## 2016-07-06 NOTE — Patient Instructions (Signed)
GO TO THE LAB : Get the blood work     GO TO THE FRONT DESK Schedule your next appointment for a  Physical exam in 6 months   

## 2016-07-06 NOTE — Progress Notes (Signed)
Pre visit review using our clinic review tool, if applicable. No additional management support is needed unless otherwise documented below in the visit note. 

## 2016-07-06 NOTE — Progress Notes (Signed)
Subjective:    Patient ID: Harry Baker, male    DOB: September 23, 1946, 70 y.o.   MRN: [id number]  DOS:  07/06/2016 Type of visit - description : Routine visit Interval history: DM: Doing great with diet over the last 6 weeks, has lost several pounds. No ambulatory CBGs Poison ivy: Needs a clobetasol prescription, the clobetasol cream is barely helping. Has a mass on the left foot, believed to be a cyst, getting bigger. No pain. High cholesterol: Good compliance with medications    Wt Readings from Last 3 Encounters:  07/06/16 226 lb 2 oz (102.6 kg)  01/02/16 235 lb 6 oz (106.8 kg)  09/04/15 214 lb 12.8 oz (97.4 kg)    Review of Systems Denies chest pain or difficulty breathing No nausea, vomiting, diarrhea  Past Medical History:  Diagnosis Date  . CAD (coronary artery disease)    s/p stent 6/08(-) myoview, 8/11 negative stress test  . Diabetes mellitus   . Diverticulosis of colon   . ERECTILE DYSFUNCTION   . GERD   . Hemorrhoid    problems off and on  . Herniated disc    sess chiropractor 2010  . HOH (hard of hearing)   . Hyperlipidemia   . Vocal cord polyp     Past Surgical History:  Procedure Laterality Date  . MOUTH SURGERY  2010  . TONSILLECTOMY    . VC cyst removal      Social History   Social History  . Marital status: Married    Spouse name: N/A  . Number of children: 1  . Years of education: N/A   Occupational History  . 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 Smoking status: Light Tobacco Smoker    Types: Cigars  . Smokeless tobacco: Never Used     Comment: occ cigar  . Alcohol use Yes     Comment: wine socially  . Drug use: No  . Sexual activity: Not on file   Other Topics Concern  . 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 Always use your most recent med list.            aspirin 325 MG tablet Take 325 mg by mouth daily.   atorvastatin 40 MG tablet Commonly known as:  LIPITOR Take 1 tablet (40 mg total) by mouth daily.   co-enzyme Q-10 30 MG capsule Take 30 mg by mouth daily.   fish oil-omega-3 fatty acids 1000 MG capsule Take 4 g by mouth daily.   GLUCOSAMINE-CHONDROITIN PO Take 1 tablet by mouth daily.   glucose blood test strip Use as instructed   Lancets 30G Misc Use as directed   metFORMIN 850 MG tablet Commonly known as:  GLUCOPHAGE Take 1 tablet (850 mg total) by mouth 2 (two) times daily with a meal.   multivitamin capsule Take 1 capsule by mouth daily.   nitroGLYCERIN 0.4 MG SL tablet Commonly known as:  NITROSTAT Place 0.4 mg under the tongue every 5 (five) minutes as needed for chest pain. Reported on 01/02/2016   ramipril 5 MG capsule Commonly known as:  ALTACE Take 1 capsule (5 mg total) by mouth daily.          Objective:   Physical Exam  Musculoskeletal:       Feet:   BP 118/72 (BP Location: Left Arm, Patient Position: Sitting, Cuff Size: Normal)   Pulse 78   Temp 97.6 F (36.4 C) (Oral)   Resp 14   Ht 5\' 9"  (1.753 m)   Wt 226 lb 2 oz (102.6 kg)   SpO2 98%   BMI 33.39 kg/m  General:   Well developed, well nourished . NAD.  HEENT:  Normocephalic . Face symmetric, atraumatic Lungs:  CTA B Normal respiratory effort, no intercostal retractions, no accessory muscle use. Heart: RRR,  no murmur.  No pretibial edema bilaterally  DIABETIC FEET EXAM: No lower extremity edema Normal pedal pulses bilaterally Skin normal, nails normal, no calluses Pinprick examination of the feet normal. Neurologic:  alert & oriented X3.  Speech normal, gait appropriate for age and unassisted Psych--  Cognition and judgment appear intact.  Cooperative with normal attention span and concentration.  Behavior appropriate. No anxious or depressed appearing.      Assessment & Plan:   Assessment DM Hyperlipidemia CAD,  stent 2008, negative stress test 2011 GERD ED HOH  PLAN: DM: Doing great with diet in the last few weeks, check A1c, BMP, AST, ALT. CAD: Asx, controlling CV RF Poison ivy:  clobetasol cream helps marginally, change to clobetasol ointment. Avoidance discussed Foot mass: Patient reported it has increased in size lately, believed to be a cyst, refer to orthopedic surgery. Primary care: Flu shot  RTC, CPX, 6 months

## 2016-07-06 NOTE — Assessment & Plan Note (Signed)
DM: Doing great with diet in the last few weeks, check A1c, BMP, AST, ALT. CAD: Asx, controlling CV RF Poison ivy:  clobetasol cream helps marginally, change to clobetasol ointment. Avoidance discussed Foot mass: Patient reported it has increased in size lately, believed to be a cyst, refer to orthopedic surgery. Primary care: Flu shot  RTC, CPX, 6 months

## 2016-07-11 ENCOUNTER — Encounter: Payer: Self-pay | Admitting: Internal Medicine

## 2016-07-14 ENCOUNTER — Encounter: Payer: Self-pay | Admitting: Internal Medicine

## 2016-08-07 DIAGNOSIS — H11153 Pinguecula, bilateral: Secondary | ICD-10-CM | POA: Diagnosis not present

## 2016-08-07 DIAGNOSIS — H31002 Unspecified chorioretinal scars, left eye: Secondary | ICD-10-CM | POA: Diagnosis not present

## 2016-08-07 DIAGNOSIS — E119 Type 2 diabetes mellitus without complications: Secondary | ICD-10-CM | POA: Diagnosis not present

## 2016-08-07 DIAGNOSIS — H01005 Unspecified blepharitis left lower eyelid: Secondary | ICD-10-CM | POA: Diagnosis not present

## 2016-08-07 DIAGNOSIS — H01001 Unspecified blepharitis right upper eyelid: Secondary | ICD-10-CM | POA: Diagnosis not present

## 2016-08-07 DIAGNOSIS — H25043 Posterior subcapsular polar age-related cataract, bilateral: Secondary | ICD-10-CM | POA: Diagnosis not present

## 2016-08-07 DIAGNOSIS — H2513 Age-related nuclear cataract, bilateral: Secondary | ICD-10-CM | POA: Diagnosis not present

## 2016-08-07 DIAGNOSIS — H5213 Myopia, bilateral: Secondary | ICD-10-CM | POA: Diagnosis not present

## 2016-08-07 DIAGNOSIS — H25011 Cortical age-related cataract, right eye: Secondary | ICD-10-CM | POA: Diagnosis not present

## 2016-08-07 DIAGNOSIS — H52203 Unspecified astigmatism, bilateral: Secondary | ICD-10-CM | POA: Diagnosis not present

## 2016-08-07 DIAGNOSIS — H11122 Conjunctival concretions, left eye: Secondary | ICD-10-CM | POA: Diagnosis not present

## 2016-08-07 DIAGNOSIS — H01004 Unspecified blepharitis left upper eyelid: Secondary | ICD-10-CM | POA: Diagnosis not present

## 2016-08-17 DIAGNOSIS — R2242 Localized swelling, mass and lump, left lower limb: Secondary | ICD-10-CM | POA: Diagnosis not present

## 2016-08-19 ENCOUNTER — Other Ambulatory Visit: Payer: Self-pay | Admitting: Internal Medicine

## 2016-08-19 ENCOUNTER — Other Ambulatory Visit: Payer: Self-pay | Admitting: Cardiology

## 2016-08-19 DIAGNOSIS — I2510 Atherosclerotic heart disease of native coronary artery without angina pectoris: Secondary | ICD-10-CM

## 2016-08-19 NOTE — Telephone Encounter (Signed)
Rx(s) sent to pharmacy electronically.  

## 2016-10-22 NOTE — Progress Notes (Signed)
HPI: FU coronary artery disease with prior PCI of his right coronary artery in August 2000. He also had residual LAD disease at that time. He had an abdominal ultrasound in July 2007 that showed no aneurysm. He has also had previous carotid Dopplers performed on September 24, 2005, which showed normal carotid arteries. His most recent Myoview in August of 2014 showed no ischemia or infarction and ejection fraction of 62%. Since I last saw him, the patient has dyspnea with more extreme activities but not with routine activities. It is relieved with rest. It is not associated with chest pain. There is no orthopnea, PND or pedal edema. There is no syncope or palpitations. There is no exertional chest pain.   Current Outpatient Prescriptions  Medication Sig Dispense Refill  . aspirin 325 MG tablet Take 325 mg by mouth daily.      Marland Kitchen atorvastatin (LIPITOR) 80 MG tablet Take 1 tablet (80 mg total) by mouth daily. PLEASE CONTACT OFFICE FOR ADDITIONAL REFILLS 90 tablet 0  . clobetasol ointment (TEMOVATE) [id number] % Apply 1 application topically 2 (two) times daily. 60 g 0  . co-enzyme Q-10 30 MG capsule Take 30 mg by mouth daily.     . fish oil-omega-3 fatty acids 1000 MG capsule Take 4 g by mouth daily.     Marland Kitchen GLUCOSAMINE-CHONDROITIN PO Take 1 tablet by mouth daily.    . metFORMIN (GLUCOPHAGE) 850 MG tablet Take 1 tablet (850 mg total) by mouth 2 (two) times daily with a meal. 180 tablet 2  . Multiple Vitamin (MULTIVITAMIN) capsule Take 1 capsule by mouth daily.      . nitroGLYCERIN (NITROSTAT) 0.4 MG SL tablet Place 0.4 mg under the tongue every 5 (five) minutes as needed for chest pain. Reported on 01/02/2016    . ramipril (ALTACE) 5 MG capsule Take 1 capsule (5 mg total) by mouth daily. 90 capsule 2   No current facility-administered medications for this visit.      Past Medical History:  Diagnosis Date  . CAD (coronary artery disease)    s/p stent 6/08(-) myoview, 8/11 negative stress test  .  Diabetes mellitus   . Diverticulosis of colon   . ERECTILE DYSFUNCTION   . GERD   . Hemorrhoid    problems off and on  . Herniated disc    sess chiropractor 2010  . HOH (hard of hearing)   . Hyperlipidemia   . Vocal cord polyp     Past Surgical History:  Procedure Laterality Date  . MOUTH SURGERY  2010  . TONSILLECTOMY    . VC cyst removal      Social History   Social History  . Marital status: Married    Spouse name: N/A  . Number of children: 1  . Years of education: N/A   Occupational History  . 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 Smoking status: Light Tobacco Smoker    Types: Cigars  . Smokeless tobacco: Never Used     Comment: occ cigar  . Alcohol use Yes     Comment: wine socially  . Drug use: No  . Sexual activity: Not on file   Other Topics Concern  . 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 Heart failure Mother   .  Stroke Father   . Prostate cancer Other     uncle  . Lung cancer Brother   . Colon cancer Other     great aunt  . Heart attack Other     GF  . Diabetes Sister     ROS: no fevers or chills, productive cough, hemoptysis, dysphasia, odynophagia, melena, hematochezia, dysuria, hematuria, rash, seizure activity, orthopnea, PND, pedal edema, claudication. Remaining systems are negative.  Physical Exam: Well-developed well-nourished in no acute distress.  Skin is warm and dry.  HEENT is normal.  Neck is supple.  Chest is clear to auscultation with normal expansion.  Cardiovascular exam is regular rate and rhythm.  Abdominal exam nontender or distended. No masses palpated. Extremities show no edema. neuro grossly intact  ECGSinus rhythm with no ST changes.-  A/P  1 Hyperlipidemia-continue statin. Check lipids and liver.  2 Coronary artery disease-continue aspirin and statin.    Kirk Ruths, MD

## 2016-10-28 ENCOUNTER — Ambulatory Visit (INDEPENDENT_AMBULATORY_CARE_PROVIDER_SITE_OTHER): Payer: Medicare Other | Admitting: Cardiology

## 2016-10-28 ENCOUNTER — Other Ambulatory Visit: Payer: Self-pay | Admitting: Internal Medicine

## 2016-10-28 ENCOUNTER — Encounter: Payer: Self-pay | Admitting: Cardiology

## 2016-10-28 ENCOUNTER — Other Ambulatory Visit (INDEPENDENT_AMBULATORY_CARE_PROVIDER_SITE_OTHER): Payer: Medicare Other

## 2016-10-28 VITALS — BP 106/68 | HR 70 | Ht 69.0 in | Wt 225.0 lb

## 2016-10-28 DIAGNOSIS — E7800 Pure hypercholesterolemia, unspecified: Secondary | ICD-10-CM

## 2016-10-28 DIAGNOSIS — I2510 Atherosclerotic heart disease of native coronary artery without angina pectoris: Secondary | ICD-10-CM

## 2016-10-28 LAB — HEPATIC FUNCTION PANEL
ALT: 17 U/L (ref 9–46)
AST: 24 U/L (ref 10–35)
Albumin: 4.2 g/dL (ref 3.6–5.1)
Alkaline Phosphatase: 59 U/L (ref 40–115)
BILIRUBIN INDIRECT: 0.5 mg/dL (ref 0.2–1.2)
Bilirubin, Direct: 0.1 mg/dL (ref <=0.2)
TOTAL PROTEIN: 6.6 g/dL (ref 6.1–8.1)
Total Bilirubin: 0.6 mg/dL (ref 0.2–1.2)

## 2016-10-28 LAB — BASIC METABOLIC PANEL
BUN: 18 mg/dL (ref 7–25)
CALCIUM: 9.7 mg/dL (ref 8.6–10.3)
CHLORIDE: 104 mmol/L (ref 98–110)
CO2: 25 mmol/L (ref 20–31)
CREATININE: 0.94 mg/dL (ref 0.70–1.25)
GLUCOSE: 104 mg/dL — AB (ref 65–99)
Potassium: 4.5 mmol/L (ref 3.5–5.3)
Sodium: 139 mmol/L (ref 135–146)

## 2016-10-28 LAB — LIPID PANEL
Cholesterol: 146 mg/dL (ref <200)
HDL: 46 mg/dL (ref >40)
LDL CALC: 85 mg/dL (ref <100)
TRIGLYCERIDES: 77 mg/dL (ref <150)
Total CHOL/HDL Ratio: 3.2 Ratio (ref <5.0)
VLDL: 15 mg/dL (ref <30)

## 2016-10-28 NOTE — Patient Instructions (Signed)
Medication Instructions:   NO CHANGE  Labwork:  Your physician recommends that you HAVE LAB WORK TODAY  Follow-Up:  Your physician wants you to follow-up in: ONE YEAR WITH DR CRENSHAW You will receive a reminder letter in the mail two months in advance. If you don't receive a letter, please call our office to schedule the follow-up appointment.   If you need a refill on your cardiac medications before your next appointment, please call your pharmacy.    

## 2016-10-29 ENCOUNTER — Other Ambulatory Visit: Payer: Self-pay | Admitting: *Deleted

## 2016-10-29 DIAGNOSIS — I2510 Atherosclerotic heart disease of native coronary artery without angina pectoris: Secondary | ICD-10-CM

## 2016-10-29 MED ORDER — ATORVASTATIN CALCIUM 80 MG PO TABS: 80.0000 mg | ORAL_TABLET | Freq: Every day | ORAL | 3 refills | Status: DC

## 2016-11-18 ENCOUNTER — Telehealth: Payer: Self-pay | Admitting: *Deleted

## 2016-11-18 DIAGNOSIS — E7800 Pure hypercholesterolemia, unspecified: Secondary | ICD-10-CM

## 2016-11-18 MED ORDER — EZETIMIBE 10 MG PO TABS: 10.0000 mg | ORAL_TABLET | Freq: Every day | ORAL | 3 refills | Status: DC

## 2016-11-18 NOTE — Telephone Encounter (Signed)
-----   Message from Lelon Perla, MD sent at 10/28/2016  3:48 PM EST ----- Add zetia 10 mg daily; lipids and liver 4 weeks Kirk Ruths

## 2016-11-18 NOTE — Telephone Encounter (Signed)
Spoke with pt, message was sent to him through my chart about starting the zetia. He reports he has been too busy to read it. He has zetia left from last year and will start taking that today. He will go to dr paz office for lab work in 4 weeks. He will call when refill is needed.

## 2016-12-31 ENCOUNTER — Encounter: Payer: Self-pay | Admitting: Cardiology

## 2017-01-04 ENCOUNTER — Encounter: Payer: Self-pay | Admitting: Internal Medicine

## 2017-01-04 ENCOUNTER — Ambulatory Visit (INDEPENDENT_AMBULATORY_CARE_PROVIDER_SITE_OTHER): Payer: Medicare Other | Admitting: Internal Medicine

## 2017-01-04 VITALS — BP 124/76 | HR 76 | Temp 98.1°F | Resp 14 | Ht 69.0 in | Wt 230.0 lb

## 2017-01-04 DIAGNOSIS — I2510 Atherosclerotic heart disease of native coronary artery without angina pectoris: Secondary | ICD-10-CM | POA: Diagnosis not present

## 2017-01-04 DIAGNOSIS — E785 Hyperlipidemia, unspecified: Secondary | ICD-10-CM

## 2017-01-04 DIAGNOSIS — Z125 Encounter for screening for malignant neoplasm of prostate: Secondary | ICD-10-CM | POA: Diagnosis not present

## 2017-01-04 DIAGNOSIS — E1159 Type 2 diabetes mellitus with other circulatory complications: Secondary | ICD-10-CM | POA: Diagnosis not present

## 2017-01-04 LAB — CBC WITH DIFFERENTIAL/PLATELET
BASOS PCT: 1.8 % (ref 0.0–3.0)
Basophils Absolute: 0.1 10*3/uL (ref 0.0–0.1)
EOS ABS: 0.3 10*3/uL (ref 0.0–0.7)
Eosinophils Relative: 4.1 % (ref 0.0–5.0)
HEMATOCRIT: 44.6 % (ref 39.0–52.0)
Hemoglobin: 14.8 g/dL (ref 13.0–17.0)
Lymphocytes Relative: 25.2 % (ref 12.0–46.0)
Lymphs Abs: 1.7 10*3/uL (ref 0.7–4.0)
MCHC: 33.3 g/dL (ref 30.0–36.0)
MCV: 92.8 fl (ref 78.0–100.0)
MONO ABS: 0.6 10*3/uL (ref 0.1–1.0)
Monocytes Relative: 8.4 % (ref 3.0–12.0)
NEUTROS PCT: 60.5 % (ref 43.0–77.0)
Neutro Abs: 4.2 10*3/uL (ref 1.4–7.7)
PLATELETS: 228 10*3/uL (ref 150.0–400.0)
RBC: 4.8 Mil/uL (ref 4.22–5.81)
RDW: 13.5 % (ref 11.5–15.5)
WBC: 6.9 10*3/uL (ref 4.0–10.5)

## 2017-01-04 LAB — LIPID PANEL
CHOL/HDL RATIO: 3
CHOLESTEROL: 125 mg/dL (ref 0–200)
HDL: 44.2 mg/dL (ref >39.00)
LDL CALC: 63 mg/dL (ref 0–99)
NonHDL: 80.61
TRIGLYCERIDES: 90 mg/dL (ref 0.0–149.0)
VLDL: 18 mg/dL (ref 0.0–40.0)

## 2017-01-04 LAB — HEMOGLOBIN A1C: HEMOGLOBIN A1C: 6.6 % — AB (ref 4.6–6.5)

## 2017-01-04 LAB — PSA: PSA: 4.41 ng/mL — AB (ref 0.10–4.00)

## 2017-01-04 NOTE — Progress Notes (Signed)
Subjective:    Patient ID: Harry Baker, male    DOB: 1946-03-11, 71 y.o.   MRN: [id number]  DOS:  01/04/2017 Type of visit - description : rov Interval history: DM: Good med  compliance HTN: Good med compliance, no ambulatory BPs, recently at the cardiology visit, BP was a slightly low, patient concerned, he had no symptoms that day High cholesterol, last LDH 85 on Lipitor, cardiology added Zetia. Time to check blood work.  Review of Systems  denies chest pain or difficulty breathing No nausea or vomiting, occasional has diarrhea No dysuria, gross hematuria. occasionally  urinary flow is slow  , not a major issue for the patient  Past Medical History:  Diagnosis Date  . CAD (coronary artery disease)    s/p stent 6/08(-) myoview, 8/11 negative stress test  . Diabetes mellitus   . Diverticulosis of colon   . ERECTILE DYSFUNCTION   . GERD   . Hemorrhoid    problems off and on  . Herniated disc    sess chiropractor 2010  . HOH (hard of hearing)   . Hyperlipidemia   . Vocal cord polyp     Past Surgical History:  Procedure Laterality Date  . MOUTH SURGERY  2010  . TONSILLECTOMY    . VC cyst removal      Social History   Social History  . Marital status: Married    Spouse name: N/A  . Number of children: 1  . Years of education: N/A   Occupational History  . 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 Smoking status: Light Tobacco Smoker    Types: Cigars  . Smokeless tobacco: Never Used     Comment: occ cigar  . Alcohol use Yes     Comment: wine socially  . Drug use: No  . Sexual activity: Not on file   Other Topics Concern  . 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 Always use your most recent med list.          aspirin 325 MG tablet Take  325 mg by mouth daily.   atorvastatin 80 MG tablet Commonly known as:  LIPITOR Take 1 tablet (80 mg total) by mouth daily.   clobetasol ointment 0.05 % Commonly known as:  TEMOVATE Apply 1 application topically 2 (two) times daily.   co-enzyme Q-10 30 MG capsule Take 30 mg by mouth daily.   ezetimibe 10 MG tablet Commonly known as:  ZETIA Take 1 tablet (10 mg total) by mouth daily.   fish oil-omega-3 fatty acids 1000 MG capsule Take 4 g by mouth daily.   GLUCOSAMINE-CHONDROITIN PO Take 1 tablet by mouth daily.   metFORMIN 850 MG tablet Commonly known as:  GLUCOPHAGE Take 1 tablet (850 mg total) by mouth 2 (two) times daily with a meal.   multivitamin capsule Take 1 capsule by mouth daily.   nitroGLYCERIN 0.4 MG SL tablet Commonly known as:  NITROSTAT Place 0.4 mg under the tongue every 5 (five) minutes as needed for chest pain. Reported on 01/02/2016   ramipril 5 MG capsule Commonly known as:  ALTACE Take 1 capsule (5 mg total) by mouth daily.  Objective:   Physical Exam BP 124/76 (BP Location: Left Arm, Patient Position: Sitting, Cuff Size: Normal)   Pulse 76   Temp 98.1 F (36.7 C) (Oral)   Resp 14   Ht 5\' 9"  (1.753 m)   Wt 230 lb (104.3 kg)   SpO2 98%   BMI 33.97 kg/m  General:   Well developed, well nourished . NAD.  HEENT:  Normocephalic . Face symmetric, atraumatic Lungs:  CTA B Normal respiratory effort, no intercostal retractions, no accessory muscle use. Heart: RRR,  no murmur.  no pretibial edema bilaterally  Abdomen:  Not distended, soft, non-tender. No rebound or rigidity.   Rectal:  External abnormalities: none. Normal sphincter tone. No rectal masses or tenderness.  No stools found Prostate: Prostate gland firm and smooth, no enlargement, nodularity, tenderness, mass, asymmetry or induration.  Skin: Not pale. Not jaundice Neurologic:  alert & oriented X3.  Speech normal, gait appropriate for age and unassisted Psych--    Cognition and judgment appear intact.  Cooperative with normal attention span and concentration.  Behavior appropriate. No anxious or depressed appearing.     Assessment & Plan:    Assessment DM Hyperlipidemia CAD, stent 2008, negative stress test 2011 GERD ED HOH  PLAN: DM: On metformin, last CMP satisfactory, check A1c.  Hyperlipidemia: On Lipitor, about a month ago Zetia was added because the LDL was in the 80s. Recheck labs. CAD: Asx, stable per last cardiology note, on aspirin, Lipitor, ACE inhibitor . Check a CBC Primary care issues discussed today. Needs a Medicare wellness in 6 months. Also he had a number of questions about his dental health, needs a crown, if is affordable, rec to proceed with a crown, aware of the relationship between dental health and health in general. RTC 6 months

## 2017-01-04 NOTE — Assessment & Plan Note (Addendum)
Primary care issues reviewed: --Td 2013; Last Pneumovax 2013;Prevnar 11-2014 ;shingle shots 11-09 -CCS: colonoscopy 2000 , 11-2009 ----> next 2021 -DRE normal today, check a PSA. Recommend Medicare wellness exam

## 2017-01-04 NOTE — Progress Notes (Signed)
Pre visit review using our clinic review tool, if applicable. No additional management support is needed unless otherwise documented below in the visit note. 

## 2017-01-04 NOTE — Assessment & Plan Note (Signed)
DM: On metformin, last CMP satisfactory, check A1c.  Hyperlipidemia: On Lipitor, about a month ago Zetia was added because the LDL was in the 80s. Recheck labs. CAD: Asx, stable per last cardiology note, on aspirin, Lipitor, ACE inhibitor . Check a CBC Primary care issues discussed today. Needs a Medicare wellness in 6 months. Also he had a number of questions about his dental health, needs a crown, if is affordable, rec to proceed with a crown, aware of the relationship between dental health and health in general. RTC 6 months

## 2017-01-04 NOTE — Patient Instructions (Signed)
GO TO THE LAB : Get the blood work     GO TO THE FRONT DESK Schedule your next appointment for a  routine checkup and a Medicare wellness in 6 months

## 2017-03-18 ENCOUNTER — Other Ambulatory Visit: Payer: Self-pay | Admitting: Internal Medicine

## 2017-03-18 ENCOUNTER — Telehealth: Payer: Self-pay | Admitting: Cardiology

## 2017-03-18 MED ORDER — EZETIMIBE 10 MG PO TABS: 10.0000 mg | ORAL_TABLET | Freq: Every day | ORAL | 3 refills | Status: DC

## 2017-03-18 NOTE — Telephone Encounter (Signed)
New Message     *STAT* If patient is at the pharmacy, call can be transferred to refill team.   1. Which medications need to be refilled? (please list name of each medication and dose if known)   ezetimibe (ZETIA) 10 MG tablet(Expired) Take 1 tablet (10 mg total) by mouth daily.     2. Which pharmacy/location (including street and city if local pharmacy) is medication to be sent to? [facility] on [address] [phone]  3. Do they need a 30 day or 90 day supply? [place]

## 2017-03-18 NOTE — Telephone Encounter (Signed)
Refill sent to the pharmacy electronically.  

## 2017-04-16 IMAGING — CR DG HIP (WITH OR WITHOUT PELVIS) 2-3V*R*
3 series · 3 of 3 positions shown · non-contrast
Comparison: None.

CLINICAL DATA: Bilateral hip pain for the past week without known
injury

EXAM:
DG HIP (WITH OR WITHOUT PELVIS) 2-3V RIGHT

[wall pelvis]
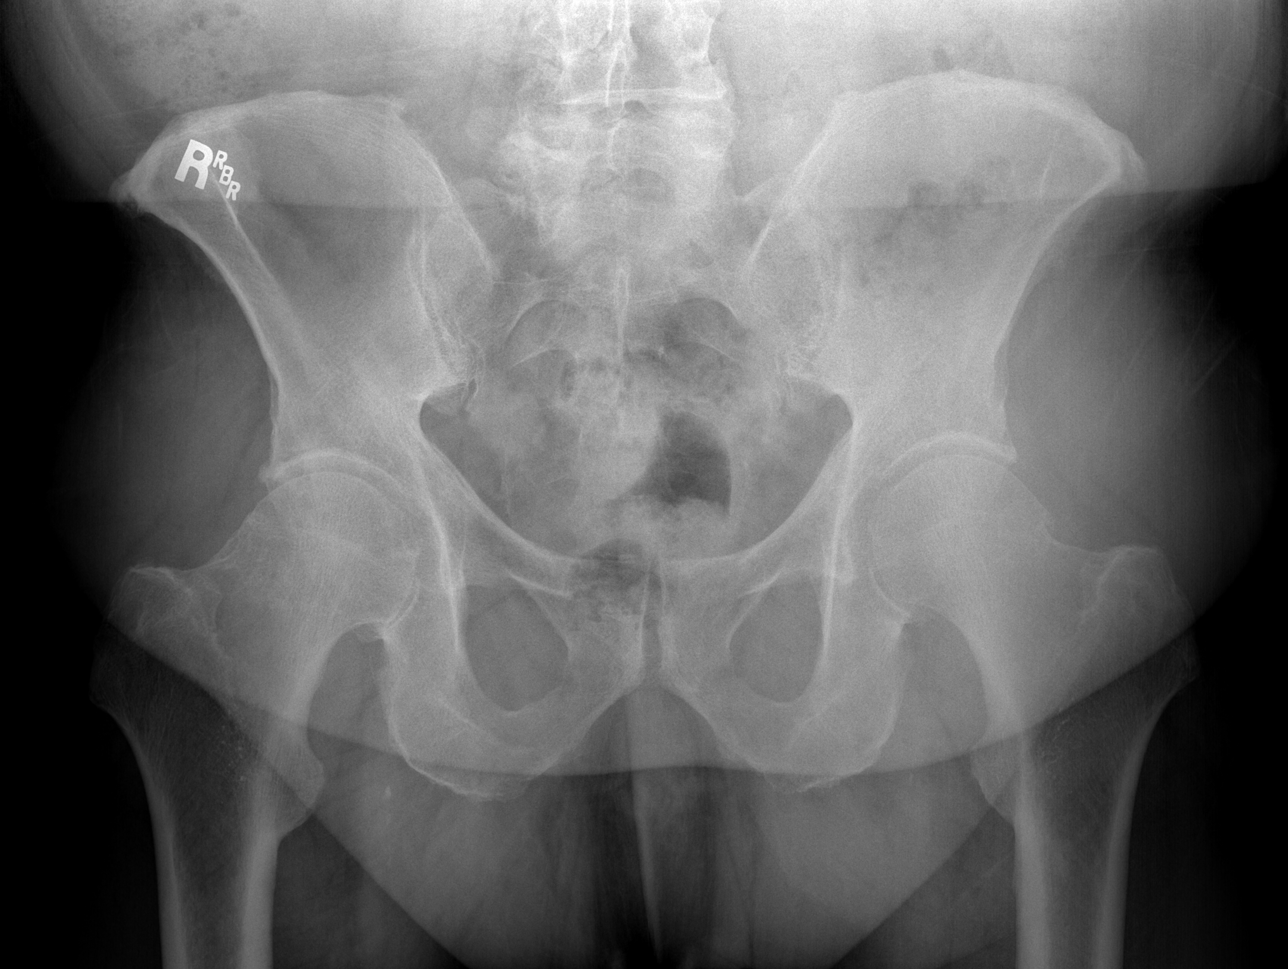

[w hip ap right]
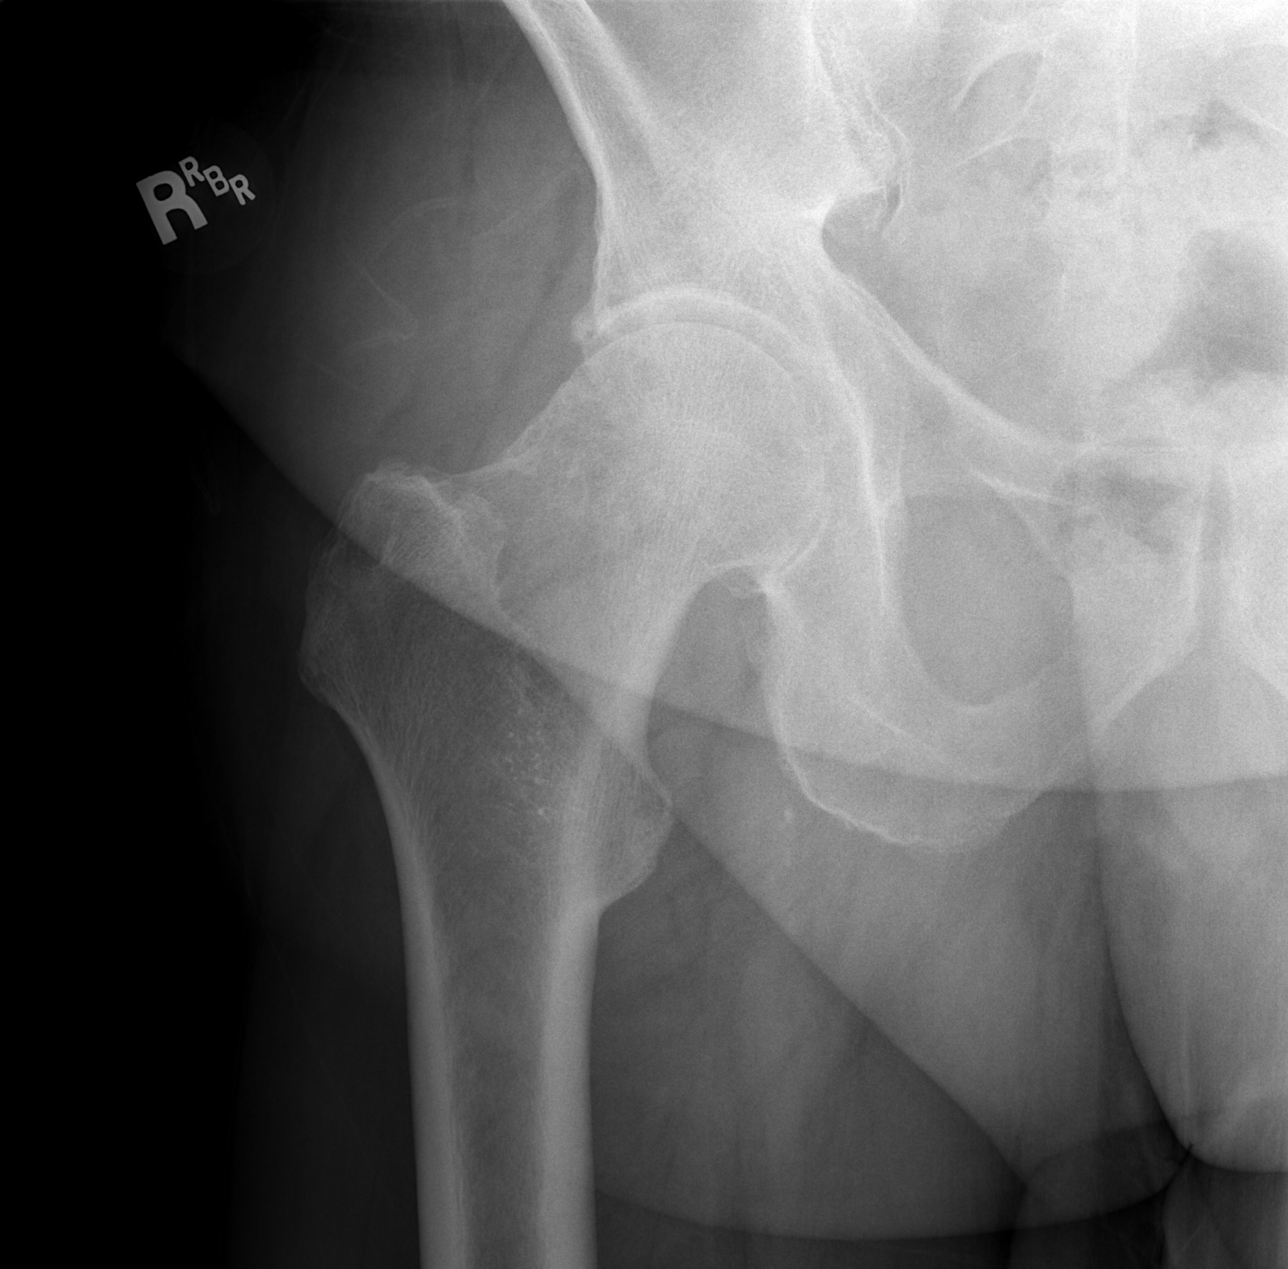

[w lat hip right]
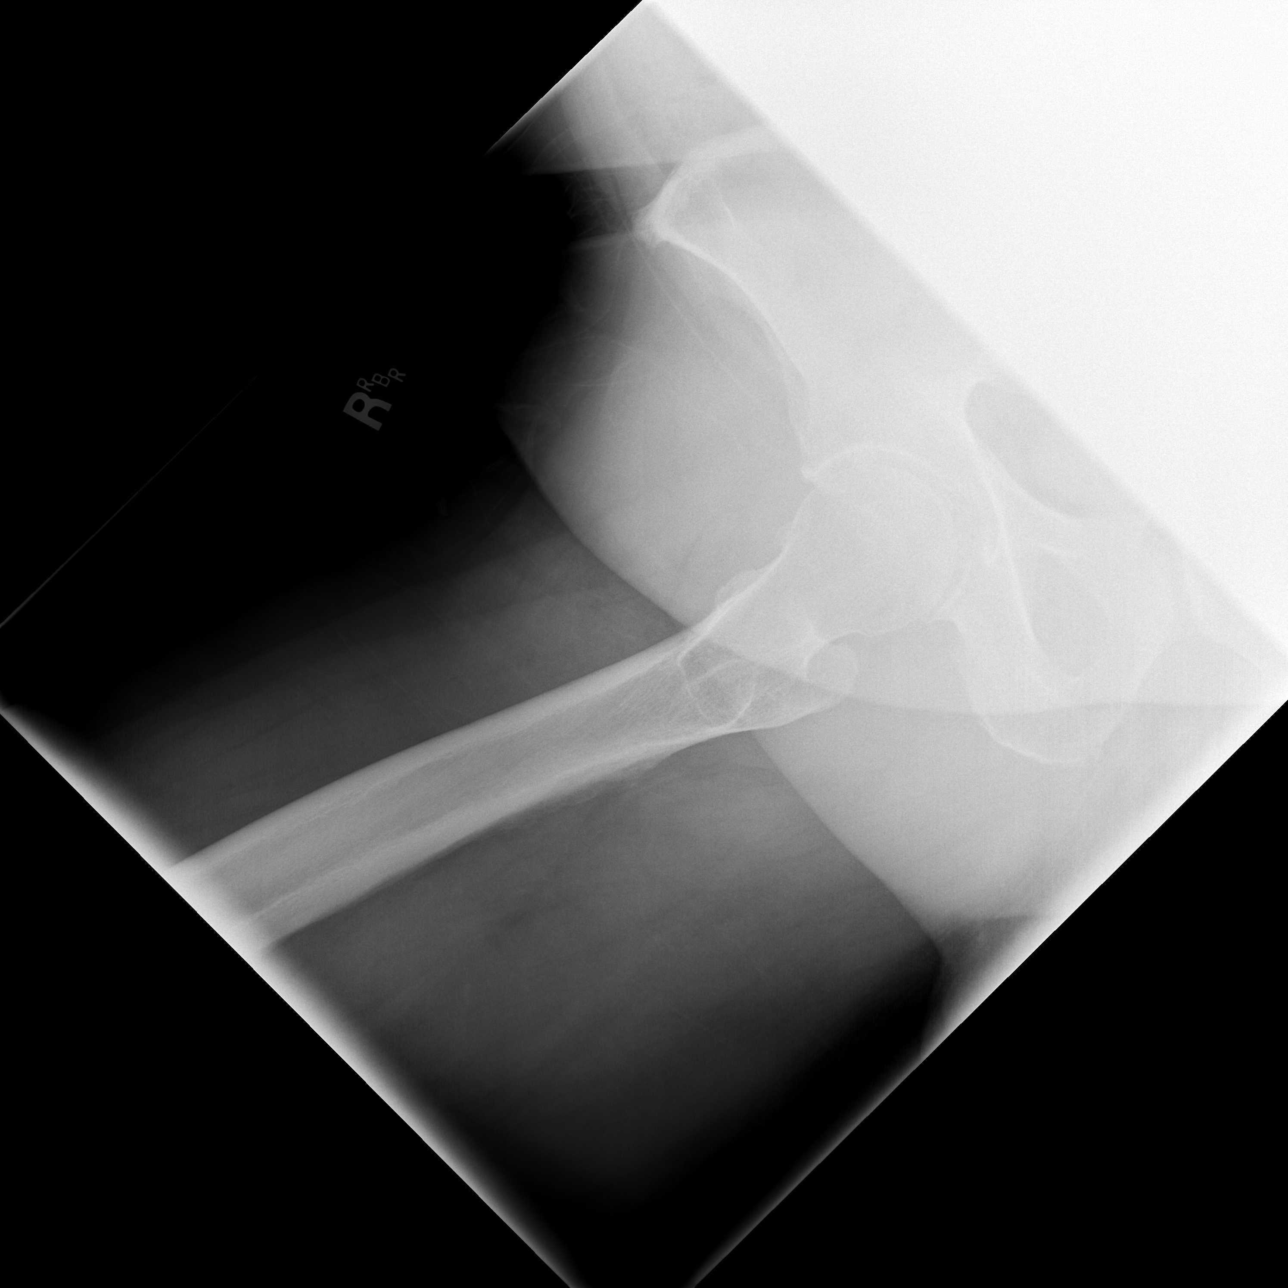

[3 of 3 positions shown; findings below may reference images not displayed]

FINDINGS: The bony pelvis is adequately mineralized. There is no lytic nor
blastic lesion. AP and frog-leg lateral views of the right hip
reveal reasonable preservation of the joint space. The articular
surfaces of the femoral head and the acetabulum remains smoothly
rounded. The femoral neck and intertrochanteric regions are normal.
IMPRESSION: There is no acute or significant chronic bony abnormality of the
right hip.

## 2017-05-20 DIAGNOSIS — L259 Unspecified contact dermatitis, unspecified cause: Secondary | ICD-10-CM | POA: Diagnosis not present

## 2017-05-25 DIAGNOSIS — L989 Disorder of the skin and subcutaneous tissue, unspecified: Secondary | ICD-10-CM | POA: Diagnosis not present

## 2017-06-08 DIAGNOSIS — D1801 Hemangioma of skin and subcutaneous tissue: Secondary | ICD-10-CM | POA: Diagnosis not present

## 2017-06-08 DIAGNOSIS — L821 Other seborrheic keratosis: Secondary | ICD-10-CM | POA: Diagnosis not present

## 2017-06-08 DIAGNOSIS — L249 Irritant contact dermatitis, unspecified cause: Secondary | ICD-10-CM | POA: Diagnosis not present

## 2017-06-08 DIAGNOSIS — D229 Melanocytic nevi, unspecified: Secondary | ICD-10-CM | POA: Diagnosis not present

## 2017-06-30 NOTE — Progress Notes (Deleted)
Subjective:   Harry Baker is a 71 y.o. male who presents for Medicare Annual/Subsequent preventive examination.  Review of Systems:  No ROS.  Medicare Wellness Visit. Additional risk factors are reflected in the social history.    Sleep patterns:    Home Safety/Smoke Alarms: Feels safe in home. Smoke alarms in place.  Living environment; residence and Firearm Safety:  [place] Safety/Bike Helmet: Wears seat belt.   Male:   CCS- last 12/13/09    PSA-  Lab Results  Component Value Date   PSA 4.41 (H) 01/04/2017   PSA 1.05 11/29/2013   PSA 0.88 11/23/2011       Objective:    Vitals: There were no vitals taken for this visit.  There is no height or weight on file to calculate BMI.  Tobacco History  Smoking Status  . Light Tobacco Smoker  . Types: Cigars  Smokeless Tobacco  . Never Used    Comment: occ cigar     Ready to quit: Not Answered Counseling given: Not Answered   Past Medical History:  Diagnosis Date  . CAD (coronary artery disease)    s/p stent 6/08(-) myoview, 8/11 negative stress test  . Diabetes mellitus   . Diverticulosis of colon   . ERECTILE DYSFUNCTION   . GERD   . Hemorrhoid    problems off and on  . Herniated disc    sess chiropractor 2010  . HOH (hard of hearing)   . Hyperlipidemia   . Vocal cord polyp    Past Surgical History:  Procedure Laterality Date  . MOUTH SURGERY  2010  . TONSILLECTOMY    . VC cyst removal     Family History  Problem Relation Age of Onset  . Heart failure Mother   . Stroke Father   . Prostate cancer Other        uncle  . Lung cancer Brother   . Colon cancer Other        great aunt  . Heart attack Other        GF  . Diabetes Sister    History  Sexual Activity  . Sexual activity: Not on file    Outpatient Encounter Prescriptions as of 07/07/2017  Medication Sig  . aspirin 325 MG tablet Take 325 mg by mouth daily.    Marland Kitchen atorvastatin (LIPITOR) 80 MG tablet Take 1 tablet (80 mg total) by  mouth daily.  . clobetasol ointment (TEMOVATE) 2.83 % Apply 1 application topically 2 (two) times daily.  Marland Kitchen co-enzyme Q-10 30 MG capsule Take 30 mg by mouth daily.   Marland Kitchen ezetimibe (ZETIA) 10 MG tablet Take 1 tablet (10 mg total) by mouth daily.  . fish oil-omega-3 fatty acids 1000 MG capsule Take 4 g by mouth daily.   Marland Kitchen GLUCOSAMINE-CHONDROITIN PO Take 1 tablet by mouth daily.  . metFORMIN (GLUCOPHAGE) 850 MG tablet Take 1 tablet (850 mg total) by mouth 2 (two) times daily with a meal.  . Multiple Vitamin (MULTIVITAMIN) capsule Take 1 capsule by mouth daily.    . nitroGLYCERIN (NITROSTAT) 0.4 MG SL tablet Place 0.4 mg under the tongue every 5 (five) minutes as needed for chest pain. Reported on 01/02/2016  . ramipril (ALTACE) 5 MG capsule Take 1 capsule (5 mg total) by mouth daily.   No facility-administered encounter medications on file as of 07/07/2017.     Activities of Daily Living In your present state of health, do you have any difficulty performing the following activities:  07/06/2016  Hearing? Y  Vision? N  Difficulty concentrating or making decisions? N  Walking or climbing stairs? N  Dressing or bathing? N  Doing errands, shopping? N  Some recent data might be hidden    Patient Care Team: Colon Branch, MD as PCP - General Inda Castle, MD as Consulting Physician (Gastroenterology) Lelon Perla, MD as Consulting Physician (Cardiology)   Assessment:    Physical assessment deferred to PCP.  Exercise Activities and Dietary recommendations   Diet (meal preparation, eat out, water intake, caffeinated beverages, dairy products, fruits and vegetables): {Desc; diets:16563} Breakfast: Lunch:  Dinner:      Goals    None     Fall Risk Fall Risk  07/06/2016 01/02/2016 06/17/2015 12/17/2014 11/29/2013  Falls in the past year? No No No No No   Depression Screen PHQ 2/9 Scores 07/06/2016 01/02/2016 06/17/2015 12/17/2014  PHQ - 2 Score 0 0 0 0    Cognitive Function         Immunization History  Administered Date(s) Administered  . Hep A / Hep B 05/30/2008, 06/29/2008, 11/29/2008  . Influenza Split 08/16/2012  . Influenza Whole 07/20/2007, 07/08/2010  . Influenza, High Dose Seasonal PF 07/31/2013, 07/22/2015, 07/06/2016  . Influenza,inj,Quad PF,6+ Mos 07/31/2014  . Pneumococcal Conjugate-13 12/17/2014  . Pneumococcal Polysaccharide-23 07/25/2002, 08/26/2007, 09/01/2012  . Td 07/25/2002  . Tdap 11/23/2011  . Zoster 08/28/2008   Screening Tests Health Maintenance  Topic Date Due  . OPHTHALMOLOGY EXAM  01/07/1956  . INFLUENZA VACCINE  05/19/2017  . FOOT EXAM  07/06/2017  . HEMOGLOBIN A1C  07/07/2017  . COLONOSCOPY  12/14/2019  . TETANUS/TDAP  11/22/2021  . Hepatitis C Screening  Completed  . PNA vac Low Risk Adult  Completed      Plan:   ***  I have personally reviewed and noted the following in the patient's chart:   . Medical and social history . Use of alcohol, tobacco or illicit drugs  . Current medications and supplements . Functional ability and status . Nutritional status . Physical activity . Advanced directives . List of other physicians . Hospitalizations, surgeries, and ER visits in previous 12 months . Vitals . Screenings to include cognitive, depression, and falls . Referrals and appointments  In addition, I have reviewed and discussed with patient certain preventive protocols, quality metrics, and best practice recommendations. A written personalized care plan for preventive services as well as general preventive health recommendations were provided to patient.     Naaman Plummer [place], [place]  06/30/2017

## 2017-07-07 ENCOUNTER — Encounter: Payer: Self-pay | Admitting: Internal Medicine

## 2017-07-07 ENCOUNTER — Ambulatory Visit (INDEPENDENT_AMBULATORY_CARE_PROVIDER_SITE_OTHER): Payer: Medicare Other | Admitting: Internal Medicine

## 2017-07-07 VITALS — BP 124/68 | HR 72 | Temp 98.0°F | Resp 14 | Ht 69.0 in | Wt 234.1 lb

## 2017-07-07 DIAGNOSIS — Z23 Encounter for immunization: Secondary | ICD-10-CM | POA: Diagnosis not present

## 2017-07-07 DIAGNOSIS — I2510 Atherosclerotic heart disease of native coronary artery without angina pectoris: Secondary | ICD-10-CM | POA: Diagnosis not present

## 2017-07-07 DIAGNOSIS — E785 Hyperlipidemia, unspecified: Secondary | ICD-10-CM

## 2017-07-07 DIAGNOSIS — E1159 Type 2 diabetes mellitus with other circulatory complications: Secondary | ICD-10-CM | POA: Diagnosis not present

## 2017-07-07 LAB — COMPREHENSIVE METABOLIC PANEL
ALT: 23 U/L (ref 0–53)
AST: 23 U/L (ref 0–37)
Albumin: 4.2 g/dL (ref 3.5–5.2)
Alkaline Phosphatase: 60 U/L (ref 39–117)
BUN: 21 mg/dL (ref 6–23)
CALCIUM: 9.4 mg/dL (ref 8.4–10.5)
CO2: 31 meq/L (ref 19–32)
CREATININE: 0.93 mg/dL (ref 0.40–1.50)
Chloride: 105 mEq/L (ref 96–112)
GFR: 85.25 mL/min (ref >60.00)
GLUCOSE: 133 mg/dL — AB (ref 70–99)
Potassium: 4.6 mEq/L (ref 3.5–5.1)
SODIUM: 141 meq/L (ref 135–145)
Total Bilirubin: 0.9 mg/dL (ref 0.2–1.2)
Total Protein: 6.8 g/dL (ref 6.0–8.3)

## 2017-07-07 LAB — HEMOGLOBIN A1C: Hgb A1c MFr Bld: 6.9 % — ABNORMAL HIGH (ref 4.6–6.5)

## 2017-07-07 LAB — LIPID PANEL
CHOL/HDL RATIO: 3
Cholesterol: 143 mg/dL (ref 0–200)
HDL: 49.8 mg/dL (ref >39.00)
LDL CALC: 66 mg/dL (ref 0–99)
NONHDL: 92.96
TRIGLYCERIDES: 134 mg/dL (ref 0.0–149.0)
VLDL: 26.8 mg/dL (ref 0.0–40.0)

## 2017-07-07 NOTE — Patient Instructions (Addendum)
GO TO THE LAB : Get the blood work     GO TO THE FRONT DESK Schedule your next appointment for a  routine checkup in 6 months  

## 2017-07-07 NOTE — Progress Notes (Signed)
Subjective:    Patient ID: Harry Baker, male    DOB: Jul 28, 1946, 71 y.o.   MRN: [id number]  DOS:  07/07/2017 Type of visit - description : rov Interval history: DM: Good compliance of medication, not ambulatory CBGs High cholesterol: On dual therapy, would like his cholesterol to be checked.   Review of Systems Denies chest pain or difficulty breathing No nausea, vomiting, diarrhea  Past Medical History:  Diagnosis Date  . CAD (coronary artery disease)    s/p stent 6/08(-) myoview, 8/11 negative stress test  . Diabetes mellitus   . Diverticulosis of colon   . ERECTILE DYSFUNCTION   . GERD   . Hemorrhoid    problems off and on  . Herniated disc    sess chiropractor 2010  . HOH (hard of hearing)   . Hyperlipidemia   . Vocal cord polyp     Past Surgical History:  Procedure Laterality Date  . MOUTH SURGERY  2010  . TONSILLECTOMY    . VC cyst removal      Social History   Social History  . Marital status: Married    Spouse name: N/A  . Number of children: 1  . Years of education: N/A   Occupational History  . 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 Smoking status: Light Tobacco Smoker    Types: Cigars  . Smokeless tobacco: Never Used     Comment: occ cigar  . Alcohol use Yes     Comment: wine socially  . Drug use: No  . Sexual activity: Not on file   Other Topics Concern  . 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 Always use your most recent med list.          aspirin 325 MG tablet Take 325 mg by mouth daily.   atorvastatin 80 MG tablet Commonly known as:  LIPITOR Take 1 tablet (80 mg total) by mouth daily.   Biotin 2500 MCG Caps Take 2,500 mcg by mouth daily.   clobetasol ointment 0.05 % Commonly known as:  TEMOVATE Apply 1  application topically 2 (two) times daily.   co-enzyme Q-10 30 MG capsule Take 30 mg by mouth daily.   ezetimibe 10 MG tablet Commonly known as:  ZETIA Take 1 tablet (10 mg total) by mouth daily.   fish oil-omega-3 fatty acids 1000 MG capsule Take 4 g by mouth daily.   GLUCOSAMINE-CHONDROITIN PO Take 1 tablet by mouth daily.   metFORMIN 850 MG tablet Commonly known as:  GLUCOPHAGE Take 1 tablet (850 mg total) by mouth 2 (two) times daily with a meal.   multivitamin capsule Take 1 capsule by mouth daily.   nitroGLYCERIN 0.4 MG SL tablet Commonly known as:  NITROSTAT Place 0.4 mg under the tongue every 5 (five) minutes as needed for chest pain. Reported on 01/02/2016   ramipril 5 MG capsule Commonly known as:  ALTACE Take 1 capsule (5 mg total) by mouth daily.            Discharge Care Instructions        Start     Ordered   07/07/17 0000  Comp Met (CMET)  07/07/17 0926   07/07/17 0000  Lipid panel     07/07/17 0926   07/07/17 0000  Hemoglobin A1c     07/07/17 0926   07/07/17 0000  Flu vaccine HIGH DOSE PF (Fluzone High dose)     07/07/17 0926         Objective:   Physical Exam BP 124/68 (BP Location: Left Arm, Patient Position: Sitting, Cuff Size: Normal)   Pulse 72   Temp 98 F (36.7 C) (Oral)   Resp 14   Ht 5' 9"  (1.753 m)   Wt 234 lb 2 oz (106.2 kg)   SpO2 98%   BMI 34.57 kg/m  General:   Well developed, well nourished . NAD.  HEENT:  Normocephalic . Face symmetric, atraumatic Lungs:  CTA B Normal respiratory effort, no intercostal retractions, no accessory muscle use. Heart: RRR,  no murmur.  No pretibial edema bilaterally  DIABETIC FEET EXAM: No lower extremity edema Normal pedal pulses bilaterally Skin normal, nails normal, no calluses Pinprick examination of the feet normal. Neurologic:  alert & oriented X3.  Speech normal, gait appropriate for age and unassisted Psych--  Cognition and judgment appear intact.  Cooperative with  normal attention span and concentration.  Behavior appropriate. No anxious or depressed appearing.      Assessment & Plan:   Assessment DM Hyperlipidemia CAD, stent 2008, negative stress test 2011 GERD ED HOH  PLAN: DM: Currently on metformin and Altace, not ambulatory CBGs. Check a A1c and CMP. Foot exam negative Hyperlipidemia: Currently on Lipitor and Zetia, last LDL 63,   would like a cholesterol panel. Will do. Continue same meds. CAD: Asx, on aspirin 325 mg, has been on that dose for many years and tolerates well. No change for now. Flu shot today. RTC 6 months

## 2017-07-07 NOTE — Progress Notes (Signed)
Pre visit review using our clinic review tool, if applicable. No additional management support is needed unless otherwise documented below in the visit note. 

## 2017-07-07 NOTE — Assessment & Plan Note (Signed)
DM: Currently on metformin and Altace, not ambulatory CBGs. Check a A1c and CMP. Foot exam negative Hyperlipidemia: Currently on Lipitor and Zetia, last LDL 63,   would like a cholesterol panel. Will do. Continue same meds. CAD: Asx, on aspirin 325 mg, has been on that dose for many years and tolerates well. No change for now. Flu shot today. RTC 6 months

## 2017-07-08 ENCOUNTER — Encounter: Payer: Self-pay | Admitting: Internal Medicine

## 2017-08-08 ENCOUNTER — Other Ambulatory Visit: Payer: Self-pay | Admitting: Internal Medicine

## 2017-08-08 ENCOUNTER — Other Ambulatory Visit: Payer: Self-pay | Admitting: Cardiology

## 2017-08-08 DIAGNOSIS — I2510 Atherosclerotic heart disease of native coronary artery without angina pectoris: Secondary | ICD-10-CM

## 2017-08-09 DIAGNOSIS — H11122 Conjunctival concretions, left eye: Secondary | ICD-10-CM | POA: Diagnosis not present

## 2017-08-09 DIAGNOSIS — H5213 Myopia, bilateral: Secondary | ICD-10-CM | POA: Diagnosis not present

## 2017-08-09 DIAGNOSIS — H2513 Age-related nuclear cataract, bilateral: Secondary | ICD-10-CM | POA: Diagnosis not present

## 2017-08-09 DIAGNOSIS — Z7984 Long term (current) use of oral hypoglycemic drugs: Secondary | ICD-10-CM | POA: Diagnosis not present

## 2017-08-09 DIAGNOSIS — H52203 Unspecified astigmatism, bilateral: Secondary | ICD-10-CM | POA: Diagnosis not present

## 2017-08-09 DIAGNOSIS — H31002 Unspecified chorioretinal scars, left eye: Secondary | ICD-10-CM | POA: Diagnosis not present

## 2017-08-09 DIAGNOSIS — H11153 Pinguecula, bilateral: Secondary | ICD-10-CM | POA: Diagnosis not present

## 2017-08-09 DIAGNOSIS — E119 Type 2 diabetes mellitus without complications: Secondary | ICD-10-CM | POA: Diagnosis not present

## 2017-08-09 DIAGNOSIS — H524 Presbyopia: Secondary | ICD-10-CM | POA: Diagnosis not present

## 2017-10-20 ENCOUNTER — Other Ambulatory Visit: Payer: Self-pay | Admitting: Cardiology

## 2017-10-20 DIAGNOSIS — I2510 Atherosclerotic heart disease of native coronary artery without angina pectoris: Secondary | ICD-10-CM

## 2017-11-03 NOTE — Progress Notes (Signed)
HPI: FU coronary artery disease with prior PCI of his right coronary artery in August 2000. He also had residual LAD disease at that time. He had an abdominal ultrasound in July 2007 that showed no aneurysm. He has also had previous carotid Dopplers performed on September 24, 2005, which showed normal carotid arteries. His most recent Myoview in August of 2014 showed no ischemia or infarction and ejection fraction of 62%. Since I last saw him, the patient denies any dyspnea on exertion, orthopnea, PND, pedal edema, palpitations, syncope or chest pain.   Current Outpatient Medications  Medication Sig Dispense Refill  . aspirin 325 MG tablet Take 325 mg by mouth daily.      Marland Kitchen atorvastatin (LIPITOR) 80 MG tablet TAKE 1 TABLET BY MOUTH EVERY DAY 30 tablet 0  . Biotin 2500 MCG CAPS Take 2,500 mcg by mouth daily.     . clobetasol ointment (TEMOVATE) 7.82 % Apply 1 application topically 2 (two) times daily. 60 g 0  . co-enzyme Q-10 30 MG capsule Take 30 mg by mouth daily.     . fish oil-omega-3 fatty acids 1000 MG capsule Take 4 g by mouth daily.     Marland Kitchen GLUCOSAMINE-CHONDROITIN PO Take 1 tablet by mouth daily.    . metFORMIN (GLUCOPHAGE) 850 MG tablet Take 1 tablet (850 mg total) by mouth 2 (two) times daily with a meal. 180 tablet 1  . Multiple Vitamin (MULTIVITAMIN) capsule Take 1 capsule by mouth daily.      . nitroGLYCERIN (NITROSTAT) 0.4 MG SL tablet Place 0.4 mg under the tongue every 5 (five) minutes as needed for chest pain. Reported on 01/02/2016    . ramipril (ALTACE) 5 MG capsule Take 1 capsule (5 mg total) by mouth daily. 90 capsule 1  . ezetimibe (ZETIA) 10 MG tablet Take 1 tablet (10 mg total) by mouth daily. 90 tablet 3   No current facility-administered medications for this visit.      Past Medical History:  Diagnosis Date  . CAD (coronary artery disease)    s/p stent 6/08(-) myoview, 8/11 negative stress test  . Diabetes mellitus   . Diverticulosis of colon   . ERECTILE  DYSFUNCTION   . GERD   . Hemorrhoid    problems off and on  . Herniated disc    sess chiropractor 2010  . HOH (hard of hearing)   . Hyperlipidemia   . Vocal cord polyp     Past Surgical History:  Procedure Laterality Date  . MOUTH SURGERY  2010  . TONSILLECTOMY    . VC cyst removal      Social History   Socioeconomic History  . Marital status: Married    Spouse name: Not on file  . Number of children: 1  . Years of education: Not on file  . Highest education level: Not on file  Social Needs  . Financial resource strain: Not on file  . Food insecurity - worry: Not on file  . Food insecurity - inability: Not on file  . Transportation needs - medical: Not on file  . Transportation needs - non-medical: Not on file  Occupational History  . 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 Smoking status: Light Tobacco Smoker    Types: Cigars  . Smokeless tobacco: Never Used  . Tobacco comment: occ cigar  Substance and Sexual Activity  . Alcohol use: Yes    Comment: wine socially  .  Drug use: No  . Sexual activity: Not on file  Other Topics Concern  . 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 Heart failure Mother   . Stroke Father   . Prostate cancer Other        uncle  . Lung cancer Brother   . Colon cancer Other        great aunt  . Heart attack Other        GF  . Diabetes Sister     ROS: no fevers or chills, productive cough, hemoptysis, dysphasia, odynophagia, melena, hematochezia, dysuria, hematuria, rash, seizure activity, orthopnea, PND, pedal edema, claudication. Remaining systems are negative.  Physical Exam: Well-developed well-nourished in no acute distress.  Skin is warm and dry.  HEENT is normal.  Neck is supple.  Chest is clear to auscultation with normal expansion.  Cardiovascular exam is regular  rate and rhythm.  Abdominal exam nontender or distended. No masses palpated. Extremities show no edema. neuro grossly intact  ECG-normal sinus rhythm at a rate of 79.  No ST changes.  Personally reviewed  A/P  1 coronary artery disease-patient doing well with no chest pain or dyspnea.  Continue aspirin and statin.  2 hyperlipidemia-continue statin and zetia.  LDL July 07, 2017 personally reviewed and 13.  Liver functions at that time normal.  Kirk Ruths, MD

## 2017-11-10 ENCOUNTER — Encounter: Payer: Self-pay | Admitting: Cardiology

## 2017-11-10 ENCOUNTER — Ambulatory Visit (INDEPENDENT_AMBULATORY_CARE_PROVIDER_SITE_OTHER): Payer: Medicare Other | Admitting: Cardiology

## 2017-11-10 VITALS — BP 103/70 | HR 91 | Ht 69.0 in | Wt 240.4 lb

## 2017-11-10 DIAGNOSIS — I2510 Atherosclerotic heart disease of native coronary artery without angina pectoris: Secondary | ICD-10-CM | POA: Diagnosis not present

## 2017-11-10 DIAGNOSIS — E7800 Pure hypercholesterolemia, unspecified: Secondary | ICD-10-CM | POA: Diagnosis not present

## 2017-11-10 NOTE — Patient Instructions (Signed)
Your physician wants you to follow-up in: ONE YEAR WITH DR CRENSHAW You will receive a reminder letter in the mail two months in advance. If you don't receive a letter, please call our office to schedule the follow-up appointment.   If you need a refill on your cardiac medications before your next appointment, please call your pharmacy.  

## 2017-12-01 ENCOUNTER — Other Ambulatory Visit: Payer: Self-pay | Admitting: Cardiology

## 2017-12-01 ENCOUNTER — Other Ambulatory Visit: Payer: Self-pay | Admitting: Internal Medicine

## 2017-12-01 DIAGNOSIS — I2510 Atherosclerotic heart disease of native coronary artery without angina pectoris: Secondary | ICD-10-CM

## 2017-12-29 ENCOUNTER — Other Ambulatory Visit: Payer: Self-pay | Admitting: Cardiology

## 2017-12-29 DIAGNOSIS — I2510 Atherosclerotic heart disease of native coronary artery without angina pectoris: Secondary | ICD-10-CM

## 2018-01-09 DIAGNOSIS — L089 Local infection of the skin and subcutaneous tissue, unspecified: Secondary | ICD-10-CM | POA: Diagnosis not present

## 2018-01-09 DIAGNOSIS — S60352A Superficial foreign body of left thumb, initial encounter: Secondary | ICD-10-CM | POA: Diagnosis not present

## 2018-01-12 ENCOUNTER — Ambulatory Visit (INDEPENDENT_AMBULATORY_CARE_PROVIDER_SITE_OTHER): Payer: Medicare Other | Admitting: Internal Medicine

## 2018-01-12 ENCOUNTER — Encounter: Payer: Self-pay | Admitting: Internal Medicine

## 2018-01-12 VITALS — BP 124/68 | HR 80 | Temp 97.8°F | Resp 16 | Ht 69.0 in | Wt 242.1 lb

## 2018-01-12 DIAGNOSIS — R972 Elevated prostate specific antigen [PSA]: Secondary | ICD-10-CM | POA: Diagnosis not present

## 2018-01-12 DIAGNOSIS — K219 Gastro-esophageal reflux disease without esophagitis: Secondary | ICD-10-CM | POA: Diagnosis not present

## 2018-01-12 DIAGNOSIS — E1165 Type 2 diabetes mellitus with hyperglycemia: Secondary | ICD-10-CM | POA: Diagnosis not present

## 2018-01-12 DIAGNOSIS — I2510 Atherosclerotic heart disease of native coronary artery without angina pectoris: Secondary | ICD-10-CM

## 2018-01-12 LAB — BASIC METABOLIC PANEL
BUN: 17 mg/dL (ref 6–23)
CHLORIDE: 102 meq/L (ref 96–112)
CO2: 31 mEq/L (ref 19–32)
Calcium: 9.8 mg/dL (ref 8.4–10.5)
Creatinine, Ser: 0.96 mg/dL (ref 0.40–1.50)
GFR: 82.06 mL/min (ref >60.00)
Glucose, Bld: 119 mg/dL — ABNORMAL HIGH (ref 70–99)
Potassium: 4.8 mEq/L (ref 3.5–5.1)
Sodium: 141 mEq/L (ref 135–145)

## 2018-01-12 LAB — PSA: PSA: 14.65 ng/mL — AB (ref 0.10–4.00)

## 2018-01-12 LAB — URINALYSIS, ROUTINE W REFLEX MICROSCOPIC
BILIRUBIN URINE: NEGATIVE
HGB URINE DIPSTICK: NEGATIVE
Ketones, ur: NEGATIVE
LEUKOCYTES UA: NEGATIVE
NITRITE: NEGATIVE
RBC / HPF: NONE SEEN (ref 0–?)
Specific Gravity, Urine: 1.02 (ref 1.000–1.030)
Total Protein, Urine: NEGATIVE
Urine Glucose: NEGATIVE
Urobilinogen, UA: 0.2 (ref 0.0–1.0)
pH: 7 (ref 5.0–8.0)

## 2018-01-12 LAB — CBC WITH DIFFERENTIAL/PLATELET
BASOS ABS: 0.1 10*3/uL (ref 0.0–0.1)
BASOS PCT: 2 % (ref 0.0–3.0)
Eosinophils Absolute: 0.2 10*3/uL (ref 0.0–0.7)
Eosinophils Relative: 3.5 % (ref 0.0–5.0)
HEMATOCRIT: 43.9 % (ref 39.0–52.0)
HEMOGLOBIN: 14.9 g/dL (ref 13.0–17.0)
LYMPHS PCT: 30.2 % (ref 12.0–46.0)
Lymphs Abs: 1.7 10*3/uL (ref 0.7–4.0)
MCHC: 33.9 g/dL (ref 30.0–36.0)
MCV: 92.9 fl (ref 78.0–100.0)
MONO ABS: 0.5 10*3/uL (ref 0.1–1.0)
Monocytes Relative: 8.5 % (ref 3.0–12.0)
Neutro Abs: 3.2 10*3/uL (ref 1.4–7.7)
Neutrophils Relative %: 55.8 % (ref 43.0–77.0)
Platelets: 225 10*3/uL (ref 150.0–400.0)
RBC: 4.73 Mil/uL (ref 4.22–5.81)
RDW: 13.3 % (ref 11.5–15.5)
WBC: 5.8 10*3/uL (ref 4.0–10.5)

## 2018-01-12 LAB — HEMOGLOBIN A1C: HEMOGLOBIN A1C: 7.4 % — AB (ref 4.6–6.5)

## 2018-01-12 MED ORDER — PANTOPRAZOLE SODIUM 40 MG PO TBEC: 40.0000 mg | DELAYED_RELEASE_TABLET | Freq: Every day | ORAL | 6 refills | Status: DC

## 2018-01-12 MED ORDER — SITAGLIPTIN PHOSPHATE 100 MG PO TABS: 100.0000 mg | ORAL_TABLET | Freq: Every day | ORAL | 6 refills | Status: DC

## 2018-01-12 NOTE — Patient Instructions (Signed)
GO TO THE LAB : Get the blood work     GO TO THE FRONT DESK Schedule your next appointment for a checkup in 4 months  Please consider get a Medicare wellness examination with one of her nurses  Start Protonix 1 tablet before breakfast every day.  Decrease aspirin to 81 mg daily  Read carefully day acid reflux precautions   Food Choices for Gastroesophageal Reflux Disease, Adult When you have gastroesophageal reflux disease (GERD), the foods you eat and your eating habits are very important. Choosing the right foods can help ease the discomfort of GERD. Consider working with a diet and nutrition specialist (dietitian) to help you make healthy food choices. What general guidelines should I follow? Eating plan  Choose healthy foods low in fat, such as fruits, vegetables, whole grains, low-fat dairy products, and lean meat, fish, and poultry.  Eat frequent, small meals instead of three large meals each day. Eat your meals slowly, in a relaxed setting. Avoid bending over or lying down until 2-3 hours after eating.  Limit high-fat foods such as fatty meats or fried foods.  Limit your intake of oils, butter, and shortening to less than 8 teaspoons each day.  Avoid the following: ? Foods that cause symptoms. These may be different for different people. Keep a food diary to keep track of foods that cause symptoms. ? Alcohol. ? Drinking large amounts of liquid with meals. ? Eating meals during the 2-3 hours before bed.  Cook foods using methods other than frying. This may include baking, grilling, or broiling. Lifestyle   Maintain a healthy weight. Ask your health care provider what weight is healthy for you. If you need to lose weight, work with your health care provider to do so safely.  Exercise for at least 30 minutes on 5 or more days each week, or as told by your health care provider.  Avoid wearing clothes that fit tightly around your waist and chest.  Do not use any products  that contain nicotine or tobacco, such as cigarettes and e-cigarettes. If you need help quitting, ask your health care provider.  Sleep with the head of your bed raised. Use a wedge under the mattress or blocks under the bed frame to raise the head of the bed. What foods are not recommended? The items listed may not be a complete list. Talk with your dietitian about what dietary choices are best for you. Grains Pastries or quick breads with added fat. [place] toast. Vegetables Deep fried vegetables. [place] fries. Any vegetables prepared with added fat. Any vegetables that cause symptoms. For some people this may include tomatoes and tomato products, chili peppers, onions and garlic, and horseradish. Fruits Any fruits prepared with added fat. Any fruits that cause symptoms. For some people this may include citrus fruits, such as oranges, grapefruit, pineapple, and lemons. Meats and other protein foods High-fat meats, such as fatty beef or pork, hot dogs, ribs, ham, sausage, salami and bacon. Fried meat or protein, including fried fish and fried chicken. Nuts and nut butters. Dairy Whole milk and chocolate milk. Sour cream. Cream. Ice cream. Cream cheese. Milk shakes. Beverages Coffee and tea, with or without caffeine. Carbonated beverages. Sodas. Energy drinks. Fruit juice made with acidic fruits (such as orange or grapefruit). Tomato juice. Alcoholic drinks. Fats and oils Butter. Margarine. Shortening. Ghee. Sweets and desserts Chocolate and cocoa. Donuts. Seasoning and other foods Pepper. Peppermint and spearmint. Any condiments, herbs, or seasonings that cause symptoms. For some people, this  may include curry, hot sauce, or vinegar-based salad dressings. Summary  When you have gastroesophageal reflux disease (GERD), food and lifestyle choices are very important to help ease the discomfort of GERD.  Eat frequent, small meals instead of three large meals each day. Eat your meals slowly, in  a relaxed setting. Avoid bending over or lying down until 2-3 hours after eating.  Limit high-fat foods such as fatty meat or fried foods. This information is not intended to replace advice given to you by your health care provider. Make sure you discuss any questions you have with your health care provider. Document Released: 10/05/2005 Document Revised: 10/06/2016 Document Reviewed: 10/06/2016 Elsevier Interactive Patient Education  [employer].

## 2018-01-12 NOTE — Progress Notes (Signed)
Pre visit review using our clinic review tool, if applicable. No additional management support is needed unless otherwise documented below in the visit note. 

## 2018-01-12 NOTE — Assessment & Plan Note (Signed)
Elevated PSA: Noted on 12/2016, I was supposed to recheck 06-2017 but I did not, this was disclosed to the patient.  DRE today remains normal.  We will recheck a PSA; he is asymptomatic. DM: Continue metformin, check a A1c and BMP CAD: Asx, on aspirin 325 mg daily for years, no complications.  Recommend to drop aspirin to 81 mg daily. GERD:  previously the  GERD "symptom" was hoarseness, had a normal EGD in 2004.  Now presents with more classic heartburn at night.  No red flag symptoms, check a CBC, trial with PPIs for a few months.  Precautions discussed.  If symptoms persistent or severe given age will need to further eval. (he is reluctant to take PPIs long-term, we agree on take them for 6 weeks then stop, if symptoms resurface to let me know) rec  Medicare wellness RTC 4 months

## 2018-01-12 NOTE — Progress Notes (Addendum)
Subjective:    Patient ID: Harry Baker, male    DOB: 1946/03/11, 72 y.o.   MRN: [id number]  DOS:  01/12/2018 Type of visit - description : rov Interval history: PSA was increased a year ago. He denies dysuria, gross hematuria, mild difficulty urinating described as slow stream.  6 months history of excessive gas and flatulence.  He has also noted heartburn and some regurgitation at night. Denies nausea, vomiting, occasional diarrhea.  No blood in the stools.  No abdominal pain. No dysphasia or odynophagia.  History of diabetes, good med compliance, due for A1c  CAD: On aspirin 325, patient wonders if the dose is appropriate.  Denies chest pain or difficulty breathing  Review of Systems See above.  Past Medical History:  Diagnosis Date  . CAD (coronary artery disease)    s/p stent 6/08(-) myoview, 8/11 negative stress test  . Diabetes mellitus   . Diverticulosis of colon   . ERECTILE DYSFUNCTION   . GERD   . Hemorrhoid    problems off and on  . Herniated disc    sess chiropractor 2010  . HOH (hard of hearing)   . Hyperlipidemia   . Vocal cord polyp     Past Surgical History:  Procedure Laterality Date  . MOUTH SURGERY  2010  . TONSILLECTOMY    . VC cyst removal      Social History   Socioeconomic History  . Marital status: Married    Spouse name: Not on file  . Number of children: 1  . Years of education: Not on file  . Highest education level: Not on file  Occupational History  . 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 Financial resource strain: Not on file  . Food insecurity:    Worry: Not on file    Inability: Not on file  . Transportation needs:    Medical: Not on file    Non-medical: Not on file  Tobacco Use  . Smoking status: Light Tobacco Smoker    Types: Cigars  . Smokeless tobacco: Never Used  . Tobacco comment: occ cigar  Substance and Sexual Activity  . Alcohol use: Yes    Comment:  wine socially  . Drug use: No  . Sexual activity: Not on file  Lifestyle  . Physical activity:    Days per week: Not on file    Minutes per session: Not on file  . Stress: Not on file  Relationships  . Social connections:    Talks on phone: Not on file    Gets together: Not on file    Attends religious service: Not on file    Active member of club or organization: Not on file    Attends meetings of clubs or organizations: Not on file    Relationship status: Not on file  . Intimate partner violence:    Fear of current or ex partner: Not on file    Emotionally abused: Not on file    Physically abused: Not on file    Forced sexual activity: Not on file  Other Topics Concern  . 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 Always use your  most recent med list.          aspirin EC 81 MG tablet Take 81 mg by mouth daily.   atorvastatin 80 MG tablet Commonly known as:  LIPITOR TAKE 1 TABLET BY MOUTH EVERY DAY   Biotin 2500 MCG Caps Take 2,500 mcg by mouth daily.   cephALEXin 500 MG capsule Commonly known as:  KEFLEX Take 500 mg by mouth 2 (two) times daily.   clobetasol ointment 0.05 % Commonly known as:  TEMOVATE Apply 1 application topically 2 (two) times daily.   co-enzyme Q-10 30 MG capsule Take 30 mg by mouth daily.   ezetimibe 10 MG tablet Commonly known as:  ZETIA TAKE 1 TABLET(10 MG) BY MOUTH DAILY   fish oil-omega-3 fatty acids 1000 MG capsule Take 4 g by mouth daily.   GLUCOSAMINE-CHONDROITIN PO Take 1 tablet by mouth daily.   metFORMIN 850 MG tablet Commonly known as:  GLUCOPHAGE Take 1 tablet (850 mg total) by mouth 2 (two) times daily with a meal.   multivitamin capsule Take 1 capsule by mouth daily.   nitroGLYCERIN 0.4 MG SL tablet Commonly known as:  NITROSTAT Place 0.4 mg  under the tongue every 5 (five) minutes as needed for chest pain. Reported on 01/02/2016   pantoprazole 40 MG tablet Commonly known as:  PROTONIX Take 1 tablet (40 mg total) by mouth daily before breakfast.   ramipril 5 MG capsule Commonly known as:  ALTACE Take 1 capsule (5 mg total) by mouth daily.   sitaGLIPtin 100 MG tablet Commonly known as:  JANUVIA Take 1 tablet (100 mg total) by mouth daily.          Objective:   Physical Exam BP 124/68 (BP Location: Left Arm, Patient Position: Sitting, Cuff Size: Normal)   Pulse 80   Temp 97.8 F (36.6 C) (Oral)   Resp 16   Ht 5\' 9"  (1.753 m)   Wt 242 lb 2 oz (109.8 kg)   SpO2 96%   BMI 35.76 kg/m   General:   Well developed, well nourished . NAD.  HEENT:  Normocephalic . Face symmetric, atraumatic Lungs:  CTA B Normal respiratory effort, no intercostal retractions, no accessory muscle use. Heart: RRR,  no murmur.  no pretibial edema bilaterally  Abdomen:  Not distended, soft, non-tender. No rebound or rigidity.   Rectal: External abnormalities: Few skin tags. Normal sphincter tone. No rectal masses or tenderness.  No stools found Prostate: Prostate gland firm and smooth, no enlargement, nodularity, tenderness, mass, asymmetry or induration Skin: Not pale. Not jaundice Neurologic:  alert & oriented X3.  Speech normal, gait appropriate for age and unassisted Psych--  Cognition and judgment appear intact.  Cooperative with normal attention span and concentration.  Behavior appropriate. No anxious or depressed appearing.     Assessment & Plan:    Assessment DM Hyperlipidemia CAD, stent 2008, negative stress test 2011 GERD, Nl EGD 2004 (done for hoarseness) ED HOH  PLAN: Elevated PSA: Noted on 12/2016, I was supposed to recheck 06-2017 but I did not, this was disclosed to the patient.  DRE today remains normal.  We will recheck a PSA; he is asymptomatic. DM: Continue metformin, check a A1c and BMP CAD: Asx, on  aspirin 325 mg daily for years, no complications.  Recommend to drop aspirin to 81 mg daily. GERD:  previously the  GERD "symptom" was hoarseness, had a normal EGD in 2004.  Now presents with more classic heartburn at night.  No red flag symptoms,  check a CBC, trial with PPIs for a few months.  Precautions discussed.  If symptoms persistent or severe given age will need to further eval. (he is reluctant to take PPIs long-term, we agree on take them for 6 weeks then stop, if symptoms resurface to let me know) rec  Medicare wellness RTC 4 months

## 2018-01-21 ENCOUNTER — Encounter: Payer: Self-pay | Admitting: Internal Medicine

## 2018-01-25 DIAGNOSIS — R972 Elevated prostate specific antigen [PSA]: Secondary | ICD-10-CM | POA: Diagnosis not present

## 2018-02-15 DIAGNOSIS — D075 Carcinoma in situ of prostate: Secondary | ICD-10-CM | POA: Diagnosis not present

## 2018-02-15 DIAGNOSIS — R972 Elevated prostate specific antigen [PSA]: Secondary | ICD-10-CM | POA: Diagnosis not present

## 2018-02-15 HISTORY — PX: TRANSRECTAL ULTRASOUND: SHX5146

## 2018-02-15 HISTORY — PX: BIOPSY PROSTATE: PRO28

## 2018-02-23 ENCOUNTER — Other Ambulatory Visit (HOSPITAL_COMMUNITY): Payer: Self-pay | Admitting: Urology

## 2018-02-23 DIAGNOSIS — C61 Malignant neoplasm of prostate: Secondary | ICD-10-CM

## 2018-03-09 ENCOUNTER — Encounter (HOSPITAL_COMMUNITY)
Admission: RE | Admit: 2018-03-09 | Discharge: 2018-03-09 | Disposition: A | Discharge status: Home / Self Care | Payer: Medicare Other | Source: Ambulatory Visit | Attending: Urology | Admitting: Urology

## 2018-03-09 DIAGNOSIS — C61 Malignant neoplasm of prostate: Secondary | ICD-10-CM

## 2018-03-09 MED ORDER — TECHNETIUM TC 99M MEDRONATE IV KIT
20.9000 | PACK | Freq: Once | INTRAVENOUS | Status: AC | PRN
  Administered 2018-03-09: 20.9 via INTRAVENOUS

## 2018-03-10 ENCOUNTER — Other Ambulatory Visit: Payer: Self-pay | Admitting: Cardiovascular Disease

## 2018-03-22 DIAGNOSIS — C61 Malignant neoplasm of prostate: Secondary | ICD-10-CM | POA: Diagnosis not present

## 2018-03-23 DIAGNOSIS — L820 Inflamed seborrheic keratosis: Secondary | ICD-10-CM | POA: Diagnosis not present

## 2018-03-23 DIAGNOSIS — D485 Neoplasm of uncertain behavior of skin: Secondary | ICD-10-CM | POA: Diagnosis not present

## 2018-03-24 ENCOUNTER — Encounter: Payer: Self-pay | Admitting: Cardiology

## 2018-03-24 ENCOUNTER — Encounter: Payer: Self-pay | Admitting: Internal Medicine

## 2018-04-01 ENCOUNTER — Encounter: Payer: Self-pay | Admitting: Radiation Oncology

## 2018-04-01 DIAGNOSIS — C61 Malignant neoplasm of prostate: Secondary | ICD-10-CM | POA: Insufficient documentation

## 2018-04-13 ENCOUNTER — Encounter: Payer: Self-pay | Admitting: Radiation Oncology

## 2018-04-13 NOTE — Progress Notes (Signed)
GU Location of Tumor / Histology: Adenocarcinoma of the Prostate  If Prostate Cancer, on 02/15/2018 his Gleason Score was (4 + 3), Prostate volume ~75 mL and on 03/272019 his PSA was (14.65)  Harry Baker was first told his PSA was elevated in March 2018. Unfortunately, PSA wasn't repeated until March 2019.  Biopsies on 02/15/2018 revealed:    Past/Anticipated interventions by urology, if any: prostate biopsy, bone scan, CT scan, referral to radiation oncology  Past/Anticipated interventions by medical oncology, if any: no  Weight changes, if any: no  Bowel/Bladder complaints, if any: Denies dysuria, hematuria, urinary leakage or incontinence.   Nausea/Vomiting, if any: no, no  Pain issues, if any:  no  SAFETY ISSUES:  Prior radiation? no  Pacemaker/ICD? no  Possible current pregnancy? N/A  Is the patient on methotrexate? no  Current Complaints / other details:  72 years old. Married with one child. Resides in [place]. Father hx of bladder ca. Paternal uncle hx of prostate ca. First cousin on mother's side with hx of prostate ca.   HOH  Per Dr. Elta Guadeloupe Ottelin's note on 03/22/2018 "If he did choose to proceed with radioactive seeds I would need to shrink his prostate and we did discuss the use of Lupron"

## 2018-04-15 NOTE — Progress Notes (Signed)
Radiation Oncology         [id number]) [phone] ________________________________  Initial outpatient Consultation  Name: Harry Baker MRN: [id number]  Date: 04/18/2018  DOB: Jul 17, 1946  CC:Paz, Alda Berthold, MD  Kathie Rhodes, MD   REFERRING PHYSICIAN: Kathie Rhodes, MD  DIAGNOSIS: 72 y.o. gentleman with unfavorable intermediate risk, Stage T1c adenocarcinoma of the prostate with Gleason Score of 4+3, and PSA of 14.65    ICD-10-CM   1. Malignant neoplasm of prostate ([place]) C61   2. Prostate cancer ([place]) C61     HISTORY OF PRESENT ILLNESS: Harry Baker is a 72 y.o. male with a diagnosis of prostate cancer. He was noted to have an elevated PSA of 14.65 (01/12/2018) by his primary care physician, Dr. Colon Branch, MD. The year prior his PSA was 4.41 (March 2018). Accordingly, he was referred for evaluation in urology by Dr. Kathie Rhodes, MD on 01/25/2018,  digital rectal examination was performed at that time revealing symmetrical lobes, no nodules.  The patient proceeded to transrectal ultrasound with 12 biopsies of the prostate on 02/15/2018.  The prostate volume measured 75 grams.  Out of 12 core biopsies, 4 were positive.  The maximum Gleason score was 4+3=7, and this was seen in Left Mid Lateral, Left Apex Lateral and Left Apex. On 03/09/2018 he underwent a NM Whole Body Scan showing no scintigraphic evidence of osseous metastatic disease. On the same day he also underwent a CT pelvis without contrast showing no evidence metastatic disease in the pelvis.   The patient reviewed the biopsy results with his urologist and he has kindly been referred today for discussion of potential radiation treatment options.He would like to avoid surgery if possible despite cardiac clearance.  PREVIOUS RADIATION THERAPY: No  PAST MEDICAL HISTORY:  Past Medical History:  Diagnosis Date  . CAD (coronary artery disease)    s/p stent 6/08(-) myoview, 8/11 negative stress test  . Diabetes mellitus   . Diverticulosis of  colon   . Elevated PSA   . ERECTILE DYSFUNCTION   . GERD   . Hemorrhoid    problems off and on  . Herniated disc    sess chiropractor 2010  . HOH (hard of hearing)   . Hyperlipidemia   . Prostate cancer ([place])   . Vocal cord polyp       PAST SURGICAL HISTORY: Past Surgical History:  Procedure Laterality Date  . BIOPSY PROSTATE  02/15/2018  . MOUTH SURGERY  2010  . TONSILLECTOMY    . TRANSRECTAL ULTRASOUND  02/15/2018  . VC cyst removal      FAMILY HISTORY:  Family History  Problem Relation Age of Onset  . Heart failure Mother   . Stroke Father   . Prostate cancer Other        uncle  . Lung cancer Brother   . Colon cancer Other        great aunt  . Heart attack Other        GF  . Diabetes Sister     SOCIAL HISTORY:  Social History   Socioeconomic History  . Marital status: Married    Spouse name: Not on file  . Number of children: 1  . Years of education: Not on file  . Highest education level: Not on file  Occupational History  . 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 Financial resource strain: Not on file  . Food insecurity:  Worry: Not on file    Inability: Not on file  . Transportation needs:    Medical: Not on file    Non-medical: Not on file  Tobacco Use  . Smoking status: Light Tobacco Smoker    Types: Cigars  . Smokeless tobacco: Never Used  . Tobacco comment: occ cigar  Substance and Sexual Activity  . Alcohol use: Yes    Comment: wine socially  . Drug use: No  . Sexual activity: Not on file  Lifestyle  . Physical activity:    Days per week: Not on file    Minutes per session: Not on file  . Stress: Not on file  Relationships  . Social connections:    Talks on phone: Not on file    Gets together: Not on file    Attends religious service: Not on file    Active member of club or organization: Not on file    Attends meetings of clubs or organizations: Not on file    Relationship  status: Not on file  . Intimate partner violence:    Fear of current or ex partner: Not on file    Emotionally abused: Not on file    Physically abused: Not on file    Forced sexual activity: Not on file  Other Topics Concern  . 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 He is retired and is a Radio broadcast assistant. He is involved in [employer].  ALLERGIES: Naproxen  MEDICATIONS:  Current Outpatient Medications  Medication Sig Dispense Refill  . aspirin EC 81 MG tablet Take 81 mg by mouth daily. Taking 325 mg x 1 month    . atorvastatin (LIPITOR) 80 MG tablet TAKE 1 TABLET BY MOUTH EVERY DAY 90 tablet 0  . Biotin 2500 MCG CAPS Take 2,500 mcg by mouth daily.     Marland Kitchen co-enzyme Q-10 30 MG capsule Take 30 mg by mouth daily.     Marland Kitchen ezetimibe (ZETIA) 10 MG tablet TAKE 1 TABLET(10 MG) BY MOUTH DAILY 90 tablet 0  . fish oil-omega-3 fatty acids 1000 MG capsule Take 4 g by mouth daily.     Marland Kitchen GLUCOSAMINE-CHONDROITIN PO Take 1 tablet by mouth daily.    . metFORMIN (GLUCOPHAGE) 850 MG tablet Take 1 tablet (850 mg total) by mouth 2 (two) times daily with a meal. 180 tablet 1  . Multiple Vitamin (MULTIVITAMIN) capsule Take 1 capsule by mouth daily.      . pantoprazole (PROTONIX) 40 MG tablet Take 1 tablet (40 mg total) by mouth daily before breakfast. 30 tablet 6  . ramipril (ALTACE) 5 MG capsule Take 1 capsule (5 mg total) by mouth daily. 90 capsule 1  . sitaGLIPtin (JANUVIA) 100 MG tablet Take 1 tablet (100 mg total) by mouth daily. 30 tablet 6  . clobetasol ointment (TEMOVATE) 6.06 % Apply 1 application topically 2 (two) times daily. (Patient not taking: Reported on 04/18/2018) 60 g 0  . nitroGLYCERIN (NITROSTAT) 0.4 MG SL tablet Place 0.4 mg under the tongue every 5 (five) minutes as needed for chest pain. Reported on 01/02/2016     No current facility-administered medications for this encounter.     REVIEW OF SYSTEMS:  On  review of systems, the patient reports that he is doing well overall. He denies any chest pain, shortness of breath, cough, fevers, chills, night sweats, unintended weight changes. He denies any bowel  disturbances, and denies abdominal pain, nausea or vomiting. He denies any new musculoskeletal or joint aches or pains. His IPSS was 7, indicating mild urinary symptoms. He is able to complete sexual activity with most attempts. A complete review of systems is obtained and is otherwise negative.    PHYSICAL EXAM:  Wt Readings from Last 3 Encounters:  04/18/18 241 lb 6.4 oz (109.5 kg)  01/12/18 242 lb 2 oz (109.8 kg)  11/10/17 240 lb 6.4 oz (109 kg)   Temp Readings from Last 3 Encounters:  04/18/18 98.4 F (36.9 C) (Oral)  01/12/18 97.8 F (36.6 C) (Oral)  07/07/17 98 F (36.7 C) (Oral)   BP Readings from Last 3 Encounters:  04/18/18 115/73  01/12/18 124/68  11/10/17 103/70   Pulse Readings from Last 3 Encounters:  04/18/18 100  01/12/18 80  11/10/17 91    In general this is a well appearing Caucasian male in no acute distress. He is alert and oriented x4 and appropriate throughout the examination. HEENT reveals that the patient is normocephalic, atraumatic. EOMs are intact.  Skin is intact without any evidence of gross lesions. Cardiopulmonary assessment is negative for acute distress and he exhibits normal effort.   KPS = 100  100 - Normal; no complaints; no evidence of disease. 90   - Able to carry on normal activity; minor signs or symptoms of disease. 80   - Normal activity with effort; some signs or symptoms of disease. 55   - Cares for self; unable to carry on normal activity or to do active work. 60   - Requires occasional assistance, but is able to care for most of his personal needs. 50   - Requires considerable assistance and frequent medical care. 29   - Disabled; requires special care and assistance. 61   - Severely disabled; hospital admission is indicated although  death not imminent. 28   - Very sick; hospital admission necessary; active supportive treatment necessary. 10   - Moribund; fatal processes progressing rapidly. 0     - Dead  Karnofsky DA, Abelmann [place], Craver LS and Burchenal [facility] [phone]) The use of the nitrogen mustards in the palliative treatment of carcinoma: with particular reference to bronchogenic carcinoma Cancer 1 634-56  LABORATORY DATA:  Lab Results  Component Value Date   WBC 5.8 01/12/2018   HGB 14.9 01/12/2018   HCT 43.9 01/12/2018   MCV 92.9 01/12/2018   PLT 225.0 01/12/2018   Lab Results  Component Value Date   NA 141 01/12/2018   K 4.8 01/12/2018   CL 102 01/12/2018   CO2 31 01/12/2018   Lab Results  Component Value Date   ALT 23 07/07/2017   AST 23 07/07/2017   ALKPHOS 60 07/07/2017   BILITOT 0.9 07/07/2017     RADIOGRAPHY: No results found.    IMPRESSION/PLAN: 1. 72 y.o. gentleman with unfavorable intermediate risk, Stage T1c adenocarcinoma of the prostate with Gleason Score of 4+3, and PSA of 14.65. We discussed the patient's workup and outlines the nature of prostate cancer in this setting. The patient's T stage, Gleason's score, and PSA put him into the unfavorable intermediate risk group. Accordingly, he is eligible for a variety of potential treatment options surgical resection, brachytherapy +/- 6 months of ADT, and external beam radiotherapy +/- 6 months of ADT. We discussed the available radiation techniques, and focused on the details and logistics and delivery. We discussed and outlined the risks, benefits, short and long-term effects associated with radiotherapy and compared  and contrasted these with prostatectomy. We discussed the role of SpaceOAR in reducing the rectal toxicity associated with radiotherapy. He is interested in 6 months of ADT with XRT. We will coordinate for him to return to see Dr. Karsten Ro for fiducials, Cherre Blanc, and ADT. We will coordinate for him to return 2 months after his ADT  injection to begin external beam radiotherapy over 5 1/2 weeks.  In a visit lasting 60 minutes, greater than 50% of the time was spent face to face discussing his case, and coordinating the patient's care.     Carola Rhine, PAC    Tyler Pita, MD  [facility] Direct Dial: [phone]  Fax: [phone] [email or web address]  Skype  [email or web address]    Page Me       This document serves as a record of services personally performed by Tyler Pita, MD and Carola Rhine, PA-C. It was created on their behalf by Margit Banda, a trained medical scribe. The creation of this record is based on the scribe's personal observations and the provider's statements to them. This document has been checked and approved by the attending provider.

## 2018-04-18 ENCOUNTER — Encounter: Payer: Self-pay | Admitting: Radiation Oncology

## 2018-04-18 ENCOUNTER — Ambulatory Visit
Admission: RE | Admit: 2018-04-18 | Discharge: 2018-04-18 | Disposition: A | Discharge status: Home / Self Care | Payer: Medicare Other | Source: Ambulatory Visit | Attending: Radiation Oncology | Admitting: Radiation Oncology

## 2018-04-18 ENCOUNTER — Other Ambulatory Visit: Payer: Self-pay

## 2018-04-18 ENCOUNTER — Encounter: Payer: Self-pay | Admitting: Medical Oncology

## 2018-04-18 VITALS — BP 115/73 | HR 100 | Temp 98.4°F | Resp 20 | Wt 241.4 lb

## 2018-04-18 DIAGNOSIS — Z8249 Family history of ischemic heart disease and other diseases of the circulatory system: Secondary | ICD-10-CM | POA: Diagnosis not present

## 2018-04-18 DIAGNOSIS — K219 Gastro-esophageal reflux disease without esophagitis: Secondary | ICD-10-CM | POA: Diagnosis not present

## 2018-04-18 DIAGNOSIS — Z79899 Other long term (current) drug therapy: Secondary | ICD-10-CM | POA: Diagnosis not present

## 2018-04-18 DIAGNOSIS — F17210 Nicotine dependence, cigarettes, uncomplicated: Secondary | ICD-10-CM | POA: Insufficient documentation

## 2018-04-18 DIAGNOSIS — R972 Elevated prostate specific antigen [PSA]: Secondary | ICD-10-CM | POA: Diagnosis not present

## 2018-04-18 DIAGNOSIS — E119 Type 2 diabetes mellitus without complications: Secondary | ICD-10-CM | POA: Insufficient documentation

## 2018-04-18 DIAGNOSIS — Z7984 Long term (current) use of oral hypoglycemic drugs: Secondary | ICD-10-CM | POA: Insufficient documentation

## 2018-04-18 DIAGNOSIS — E785 Hyperlipidemia, unspecified: Secondary | ICD-10-CM | POA: Insufficient documentation

## 2018-04-18 DIAGNOSIS — Z8042 Family history of malignant neoplasm of prostate: Secondary | ICD-10-CM | POA: Diagnosis not present

## 2018-04-18 DIAGNOSIS — Z7982 Long term (current) use of aspirin: Secondary | ICD-10-CM | POA: Insufficient documentation

## 2018-04-18 DIAGNOSIS — Z800 Family history of malignant neoplasm of digestive organs: Secondary | ICD-10-CM | POA: Insufficient documentation

## 2018-04-18 DIAGNOSIS — I2510 Atherosclerotic heart disease of native coronary artery without angina pectoris: Secondary | ICD-10-CM | POA: Diagnosis not present

## 2018-04-18 DIAGNOSIS — C61 Malignant neoplasm of prostate: Secondary | ICD-10-CM

## 2018-04-18 NOTE — Progress Notes (Signed)
See progress note under physician encounter. 

## 2018-04-18 NOTE — Progress Notes (Signed)
Introduced myself to Harry Baker and his wife as the prostate nurse navigator and my role. He is not interested in surgery due to his age and the recovery. He expressed interest in external beam radiation and brachytherapy and side effects. He states his anxiety level is up and would like to  move forward with treatment as soon as possible. I gave him my business card and will continue to follow.I asked them to call with questions or concerns. He voiced understanding.

## 2018-04-27 DIAGNOSIS — C61 Malignant neoplasm of prostate: Secondary | ICD-10-CM | POA: Diagnosis not present

## 2018-04-28 ENCOUNTER — Telehealth: Payer: Self-pay | Admitting: Medical Oncology

## 2018-04-28 NOTE — Telephone Encounter (Signed)
Patient called to inform Dr. Tammi Klippel he received his Lupron 7/10.  When he received his injection, the nurse at [facility] told him she would see him in 6 months. He attempted to let her know that he is suppose to start treatment is 2 months but she did not seem to understand.I let him know I will notify Dr. Tammi Klippel of injection date and he will hear from [facility] regarding dates for fiducial markers and SpaceOar. All questions were answered. I asked him to call me back with questions or concerns. I forward information to [facility] and Dr. Tammi Klippel.

## 2018-05-18 ENCOUNTER — Ambulatory Visit (INDEPENDENT_AMBULATORY_CARE_PROVIDER_SITE_OTHER): Payer: Medicare Other | Admitting: Internal Medicine

## 2018-05-18 ENCOUNTER — Encounter: Payer: Self-pay | Admitting: Internal Medicine

## 2018-05-18 VITALS — BP 122/74 | HR 66 | Temp 97.9°F | Resp 16 | Ht 68.0 in | Wt 239.5 lb

## 2018-05-18 DIAGNOSIS — I2510 Atherosclerotic heart disease of native coronary artery without angina pectoris: Secondary | ICD-10-CM

## 2018-05-18 DIAGNOSIS — R1013 Epigastric pain: Secondary | ICD-10-CM

## 2018-05-18 DIAGNOSIS — C61 Malignant neoplasm of prostate: Secondary | ICD-10-CM

## 2018-05-18 DIAGNOSIS — E1159 Type 2 diabetes mellitus with other circulatory complications: Secondary | ICD-10-CM | POA: Diagnosis not present

## 2018-05-18 DIAGNOSIS — E7800 Pure hypercholesterolemia, unspecified: Secondary | ICD-10-CM | POA: Diagnosis not present

## 2018-05-18 LAB — COMPREHENSIVE METABOLIC PANEL
ALBUMIN: 4.2 g/dL (ref 3.5–5.2)
ALK PHOS: 67 U/L (ref 39–117)
ALT: 32 U/L (ref 0–53)
AST: 28 U/L (ref 0–37)
BUN: 21 mg/dL (ref 6–23)
CALCIUM: 9.7 mg/dL (ref 8.4–10.5)
CO2: 30 mEq/L (ref 19–32)
Chloride: 104 mEq/L (ref 96–112)
Creatinine, Ser: 1.06 mg/dL (ref 0.40–1.50)
GFR: 73.12 mL/min (ref >60.00)
Glucose, Bld: 98 mg/dL (ref 70–99)
POTASSIUM: 4.1 meq/L (ref 3.5–5.1)
Sodium: 140 mEq/L (ref 135–145)
Total Bilirubin: 1 mg/dL (ref 0.2–1.2)
Total Protein: 6.5 g/dL (ref 6.0–8.3)

## 2018-05-18 LAB — CBC WITH DIFFERENTIAL/PLATELET
Basophils Absolute: 0.1 10*3/uL (ref 0.0–0.1)
Basophils Relative: 2.1 % (ref 0.0–3.0)
EOS PCT: 3.2 % (ref 0.0–5.0)
Eosinophils Absolute: 0.2 10*3/uL (ref 0.0–0.7)
HCT: 42.8 % (ref 39.0–52.0)
HEMOGLOBIN: 14.4 g/dL (ref 13.0–17.0)
LYMPHS ABS: 1.7 10*3/uL (ref 0.7–4.0)
Lymphocytes Relative: 32.5 % (ref 12.0–46.0)
MCHC: 33.7 g/dL (ref 30.0–36.0)
MCV: 92.4 fl (ref 78.0–100.0)
MONO ABS: 0.5 10*3/uL (ref 0.1–1.0)
MONOS PCT: 9.7 % (ref 3.0–12.0)
Neutro Abs: 2.7 10*3/uL (ref 1.4–7.7)
Neutrophils Relative %: 52.5 % (ref 43.0–77.0)
Platelets: 222 10*3/uL (ref 150.0–400.0)
RBC: 4.63 Mil/uL (ref 4.22–5.81)
RDW: 13.9 % (ref 11.5–15.5)
WBC: 5.2 10*3/uL (ref 4.0–10.5)

## 2018-05-18 LAB — LIPID PANEL
CHOLESTEROL: 112 mg/dL (ref 0–200)
HDL: 39.9 mg/dL (ref >39.00)
LDL Cholesterol: 52 mg/dL (ref 0–99)
NonHDL: 71.8
TRIGLYCERIDES: 101 mg/dL (ref 0.0–149.0)
Total CHOL/HDL Ratio: 3
VLDL: 20.2 mg/dL (ref 0.0–40.0)

## 2018-05-18 LAB — TSH: TSH: 2.96 u[IU]/mL (ref 0.35–4.50)

## 2018-05-18 LAB — HEMOGLOBIN A1C: Hgb A1c MFr Bld: 7.2 % — ABNORMAL HIGH (ref 4.6–6.5)

## 2018-05-18 NOTE — Progress Notes (Signed)
Subjective:    Patient ID: Harry Baker, male    DOB: 1946/06/02, 72 y.o.   MRN: [id number]  DOS:  05/18/2018 Type of visit - description : rov Interval history: Since the last office visit he was diagnosed with prostate cancer. Continue with dyspepsia, again symptoms are burping, flatulence, feeling bloated, have some heartburn. Protonix completely take care of the symptoms but the patient is concerned PPIs are masking problems and request further work-up. DM: Started Januvia, no apparent side effects. No ambulatory CBGs   Review of Systems  Denies weight loss, decreased appetite No nausea, vomiting, diarrhea.  No change in the color of the stools + Abdominal pain "when the bloating is severe". Emotionally doing okay.  Past Medical History:  Diagnosis Date  . CAD (coronary artery disease)    s/p stent 6/08(-) myoview, 8/11 negative stress test  . Diabetes mellitus   . Diverticulosis of colon   . Elevated PSA   . ERECTILE DYSFUNCTION   . GERD   . Hemorrhoid    problems off and on  . Herniated disc    sess chiropractor 2010  . HOH (hard of hearing)   . Hyperlipidemia   . Prostate cancer ([place])   . Vocal cord polyp     Past Surgical History:  Procedure Laterality Date  . BIOPSY PROSTATE  02/15/2018  . MOUTH SURGERY  2010  . TONSILLECTOMY    . TRANSRECTAL ULTRASOUND  02/15/2018  . VC cyst removal      Social History   Socioeconomic History  . Marital status: Married    Spouse name: Not on file  . Number of children: 1  . Years of education: Not on file  . Highest education level: Not on file  Occupational History  . 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 Financial resource strain: Not on file  . Food insecurity:    Worry: Not on file    Inability: Not on file  . Transportation needs:    Medical: Not on file    Non-medical: Not on file  Tobacco Use  . Smoking status: Light Tobacco Smoker    Types:  Cigars  . Smokeless tobacco: Never Used  . Tobacco comment: occ cigar  Substance and Sexual Activity  . Alcohol use: Yes    Comment: wine socially  . Drug use: No  . Sexual activity: Not on file  Lifestyle  . Physical activity:    Days per week: Not on file    Minutes per session: Not on file  . Stress: Not on file  Relationships  . Social connections:    Talks on phone: Not on file    Gets together: Not on file    Attends religious service: Not on file    Active member of club or organization: Not on file    Attends meetings of clubs or organizations: Not on file    Relationship status: Not on file  . Intimate partner violence:    Fear of current or ex partner: Not on file    Emotionally abused: Not on file    Physically abused: Not on file    Forced sexual activity: Not on file  Other Topics Concern  . 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 Always use your most recent med list.          aspirin EC 81 MG tablet Take 81 mg by mouth daily. Taking 325 mg x 1 month   atorvastatin 80 MG tablet Commonly known as:  LIPITOR TAKE 1 TABLET BY MOUTH EVERY DAY   Biotin 2500 MCG Caps Take 2,500 mcg by mouth daily.   clobetasol ointment 0.05 % Commonly known as:  TEMOVATE Apply 1 application topically 2 (two) times daily.   co-enzyme Q-10 30 MG capsule Take 30 mg by mouth daily.   ezetimibe 10 MG tablet Commonly known as:  ZETIA TAKE 1 TABLET(10 MG) BY MOUTH DAILY   fish oil-omega-3 fatty acids 1000 MG capsule Take 4 g by mouth daily.   GLUCOSAMINE-CHONDROITIN PO Take 1 tablet by mouth daily.   metFORMIN 850 MG tablet Commonly known as:  GLUCOPHAGE Take 1 tablet (850 mg total) by mouth 2 (two) times daily with a meal.   multivitamin  capsule Take 1 capsule by mouth daily.   nitroGLYCERIN 0.4 MG SL tablet Commonly known as:  NITROSTAT Place 0.4 mg under the tongue every 5 (five) minutes as needed for chest pain. Reported on 01/02/2016   pantoprazole 40 MG tablet Commonly known as:  PROTONIX Take 1 tablet (40 mg total) by mouth daily before breakfast.   ramipril 5 MG capsule Commonly known as:  ALTACE Take 1 capsule (5 mg total) by mouth daily.   sitaGLIPtin 100 MG tablet Commonly known as:  JANUVIA Take 1 tablet (100 mg total) by mouth daily.          Objective:   Physical Exam BP 122/74 (BP Location: Left Arm, Patient Position: Sitting, Cuff Size: Small)   Pulse 66   Temp 97.9 F (36.6 C) (Oral)   Resp 16   Ht 5\' 8"  (1.727 m)   Wt 239 lb 8 oz (108.6 kg)   SpO2 97%   BMI 36.42 kg/m  General:   Well developed, NAD, see BMI.  HEENT:  Normocephalic . Face symmetric, atraumatic Lungs:  CTA B Normal respiratory effort, no intercostal retractions, no accessory muscle use. Heart: RRR,  no murmur.  no pretibial edema bilaterally  Abdomen:  Not distended, soft, non-tender. No rebound or rigidity.   Skin: Not pale. Not jaundice Neurologic:  alert & oriented X3.  Speech normal, gait appropriate for age and unassisted Psych--  Cognition and judgment appear intact.  Cooperative with normal attention span and concentration.  Behavior appropriate. No anxious or depressed appearing.     Assessment & Plan:   Assessment DM Hyperlipidemia CAD, stent 2008, negative stress test 2011 Prostate Cancer 01/2018 GERD, Nl EGD 2004 (done for hoarseness) ED HOH  PLAN: DM: Last A1c 7.4, Januvia added, he is also taking metformin.  Checking A1c. Hyperlipidemia: On Lipitor, check a FLP GERD, dyspepsia: Sxs started 06-2017, much improved with PPIs but patient is concerned PPIs may be masking other problems.  Request further eval.  Will get CBC, TSH, abdominal ultrasound and refer to GI. Prostate cancer: DX  01-2018, was given several options, currently on Lupron and plans to proceed with XRT Preventive care: Interested in [place], I agree, recommend to get it at the pharmacy ASAP (is starting XRT in few weeks),  booster in 2 to 6 m RTC 5-6 months

## 2018-05-18 NOTE — Progress Notes (Signed)
Pre visit review using our clinic review tool, if applicable. No additional management support is needed unless otherwise documented below in the visit note. 

## 2018-05-18 NOTE — Assessment & Plan Note (Signed)
DM: Last A1c 7.4, Januvia added, he is also taking metformin.  Checking A1c. Hyperlipidemia: On Lipitor, check a FLP GERD, dyspepsia: Sxs started 06-2017, much improved with PPIs but patient is concerned PPIs may be masking other problems.  Request further eval.  Will get CBC, TSH, abdominal ultrasound and refer to GI. Prostate cancer: DX 01-2018, was given several options, currently on Lupron and plans to proceed with XRT Preventive care: Interested in [place], I agree, recommend to get it at the pharmacy ASAP (is starting XRT in few weeks),  booster in 2 to 6 m RTC 5-6 months

## 2018-05-18 NOTE — Patient Instructions (Addendum)
GO TO THE LAB : Get the blood work     GO TO THE FRONT DESK Schedule your next appointment for a checkup in 5 to 6 months  Recommend to get a Shingrix injection at the pharmacy, you will need a booster 2 months later

## 2018-05-19 ENCOUNTER — Ambulatory Visit (HOSPITAL_BASED_OUTPATIENT_CLINIC_OR_DEPARTMENT_OTHER)
Admission: RE | Admit: 2018-05-19 | Discharge: 2018-05-19 | Disposition: A | Discharge status: Home / Self Care | Payer: Medicare Other | Source: Ambulatory Visit | Attending: Internal Medicine | Admitting: Internal Medicine

## 2018-05-19 ENCOUNTER — Telehealth: Payer: Self-pay | Admitting: Medical Oncology

## 2018-05-19 ENCOUNTER — Encounter: Payer: Self-pay | Admitting: Internal Medicine

## 2018-05-19 DIAGNOSIS — K760 Fatty (change of) liver, not elsewhere classified: Secondary | ICD-10-CM | POA: Insufficient documentation

## 2018-05-19 DIAGNOSIS — N281 Cyst of kidney, acquired: Secondary | ICD-10-CM | POA: Insufficient documentation

## 2018-05-19 DIAGNOSIS — R1013 Epigastric pain: Secondary | ICD-10-CM | POA: Insufficient documentation

## 2018-05-19 NOTE — Telephone Encounter (Signed)
Harry Baker called asking if he can receive the shingles vaccine. He received Lupron 7/10 and will start radiation in September. He needs gold markers and SpaceOAR placed before radiation. He will be going out of town for entire month of August and requested he not be scheduled until early September. I informed him he is ok to get the shingles vaccine and Dr. Larose Kells emailed Dr. Tammi Klippel with this questions yesterday.  I will forward message regarding dates for treatment to [facility]. He will be available by cell during his trip.

## 2018-05-20 MED ORDER — SITAGLIPTIN PHOSPHATE 100 MG PO TABS: 100.0000 mg | ORAL_TABLET | Freq: Every day | ORAL | 1 refills | Status: DC

## 2018-05-20 MED ORDER — METFORMIN HCL 850 MG PO TABS: 850.0000 mg | ORAL_TABLET | Freq: Three times a day (TID) | ORAL | 1 refills | Status: DC

## 2018-05-20 MED ORDER — RAMIPRIL 5 MG PO CAPS: 5.0000 mg | ORAL_CAPSULE | Freq: Every day | ORAL | 1 refills | Status: DC

## 2018-05-20 MED ORDER — PANTOPRAZOLE SODIUM 40 MG PO TBEC: 40.0000 mg | DELAYED_RELEASE_TABLET | Freq: Every day | ORAL | 1 refills | Status: DC

## 2018-05-20 NOTE — Addendum Note (Signed)
Addended byDamita Dunnings D on: 05/20/2018 04:27 PM   Modules accepted: Orders

## 2018-06-16 ENCOUNTER — Encounter: Payer: Self-pay | Admitting: Gastroenterology

## 2018-06-24 ENCOUNTER — Telehealth: Payer: Self-pay | Admitting: Medical Oncology

## 2018-06-24 ENCOUNTER — Telehealth: Payer: Self-pay | Admitting: *Deleted

## 2018-06-24 NOTE — Telephone Encounter (Signed)
Cookie-wife called stating her husband is back from traveling and ready to be get his gold markers/SpaceOar scheduled. I informed her [facility]-Dr. Johny Shears office will call her with these appointments. I forwarded this information to [place].

## 2018-06-24 NOTE — Telephone Encounter (Signed)
Returned patient phone call, spoke with patient.

## 2018-06-25 DIAGNOSIS — Z23 Encounter for immunization: Secondary | ICD-10-CM | POA: Diagnosis not present

## 2018-07-04 ENCOUNTER — Ambulatory Visit (INDEPENDENT_AMBULATORY_CARE_PROVIDER_SITE_OTHER): Payer: Medicare Other | Admitting: Gastroenterology

## 2018-07-04 ENCOUNTER — Encounter: Payer: Self-pay | Admitting: Gastroenterology

## 2018-07-04 ENCOUNTER — Telehealth: Payer: Self-pay | Admitting: Radiation Oncology

## 2018-07-04 VITALS — Ht 68.0 in | Wt 227.0 lb

## 2018-07-04 DIAGNOSIS — K5730 Diverticulosis of large intestine without perforation or abscess without bleeding: Secondary | ICD-10-CM | POA: Diagnosis not present

## 2018-07-04 DIAGNOSIS — R142 Eructation: Secondary | ICD-10-CM

## 2018-07-04 DIAGNOSIS — K219 Gastro-esophageal reflux disease without esophagitis: Secondary | ICD-10-CM | POA: Diagnosis not present

## 2018-07-04 NOTE — Progress Notes (Signed)
Chief Complaint: Belching, dyspepsia   Referring Provider:     Kathlene November, MD   HPI:     Harry Baker is a 72 y.o. male referred to the [facility] for evaluation of belching and abdominal discomfort.  He was previously seen by Dr. Deatra Ina, last seen in 2011.  More recently, he was seen by his PCM, Dr. Larose Kells, in July 2019.  He states he has been having dyspepsia, belching, bloating, along with regurgitation and pyrosis.  He was prescribed Protoinix 40 mg daily, and states that while this generally controls his symptoms, he was worried about taking PPIs daily.  Additionally, he has noticed improvement in symptoms with dietary modifications/healthier food choices.  He also notices that he can control symptoms if just taking a PPI prior to anticipated sxs- knowing he will eat late, going to buffet, etc.   Sxs tend to be post prandial and worse with supine.  Regurgitation and HB are typically worse after eating close to bedtime. Has lost 10# intentionally with dietary modifications.  He otherwise denies dysphasia, odynophagia, night sweats, fever, chills, change in bowel habits, overt GI blood loss.  Recent RUQ [place] with GB sludge and increased liver echogenicity but otherwise normal CBD and no duct dilation. Normal LAEs and he denies biliary sxs.  Normal CBC.  Recently diagnosed with prostate CA, currently treated with Lupron and planning on external beam radiation.  Endoscopic history: EGD (2004): Normal Colonoscopy (2011): 5 mm inflammatory polyp in transverse colon, diverticulosis, hemorrhoids.  Recommended repeat in 10 years   Past Medical History:  Diagnosis Date  . CAD (coronary artery disease)    s/p stent 6/08(-) myoview, 8/11 negative stress test  . Diabetes mellitus   . Diverticulosis of colon   . Elevated PSA   . ERECTILE DYSFUNCTION   . GERD   . Hemorrhoid    problems off and on  . Herniated disc    sess chiropractor 2010  . HOH (hard of hearing)    . Hyperlipidemia   . Prostate cancer ([place])   . Vocal cord polyp      Past Surgical History:  Procedure Laterality Date  . BIOPSY PROSTATE  02/15/2018  . MOUTH SURGERY  2010  . TONSILLECTOMY    . TRANSRECTAL ULTRASOUND  02/15/2018  . VC cyst removal     Family History  Problem Relation Age of Onset  . Heart failure Mother   . Stroke Father   . Prostate cancer Other        uncle  . Lung cancer Brother   . Colon cancer Other        great aunt  . Heart attack Other        GF  . Diabetes Sister    Social History   Tobacco Use  . Smoking status: Light Tobacco Smoker    Types: Cigars  . Smokeless tobacco: Never Used  . Tobacco comment: occ cigar  Substance Use Topics  . Alcohol use: Yes    Comment: wine socially  . Drug use: No   Current Outpatient Medications  Medication Sig Dispense Refill  . aspirin EC 81 MG tablet Take 81 mg by mouth daily. Taking 325 mg x 1 month    . atorvastatin (LIPITOR) 80 MG tablet TAKE 1 TABLET BY MOUTH EVERY DAY 90 tablet 0  . Biotin 2500 MCG CAPS Take 2,500 mcg by mouth daily.     Marland Kitchen  clobetasol ointment (TEMOVATE) 1.82 % Apply 1 application topically 2 (two) times daily. 60 g 0  . co-enzyme Q-10 30 MG capsule Take 30 mg by mouth daily.     Marland Kitchen ezetimibe (ZETIA) 10 MG tablet TAKE 1 TABLET(10 MG) BY MOUTH DAILY 90 tablet 0  . fish oil-omega-3 fatty acids 1000 MG capsule Take 4 g by mouth daily.     Marland Kitchen GLUCOSAMINE-CHONDROITIN PO Take 1 tablet by mouth daily.    . metFORMIN (GLUCOPHAGE) 850 MG tablet Take 1 tablet (850 mg total) by mouth 3 (three) times daily with meals. 270 tablet 1  . Multiple Vitamin (MULTIVITAMIN) capsule Take 1 capsule by mouth daily.      . nitroGLYCERIN (NITROSTAT) 0.4 MG SL tablet Place 0.4 mg under the tongue every 5 (five) minutes as needed for chest pain. Reported on 01/02/2016    . pantoprazole (PROTONIX) 40 MG tablet Take 1 tablet (40 mg total) by mouth daily before breakfast. 90 tablet 1  . ramipril (ALTACE) 5 MG  capsule Take 1 capsule (5 mg total) by mouth daily. 90 capsule 1  . sitaGLIPtin (JANUVIA) 100 MG tablet Take 1 tablet (100 mg total) by mouth daily. 90 tablet 1   No current facility-administered medications for this visit.    Allergies  Allergen Reactions  . Naproxen Other (See Comments)    Severe GI upset     Review of Systems: All systems reviewed and negative except where noted in HPI.     Physical Exam:    Wt Readings from Last 3 Encounters:  07/04/18 227 lb (103 kg)  05/18/18 239 lb 8 oz (108.6 kg)  04/18/18 241 lb 6.4 oz (109.5 kg)    Ht 5\' 8"  (1.727 m)   Wt 227 lb (103 kg)   BMI 34.52 kg/m  Constitutional:  Pleasant, in no acute distress. Psychiatric: Normal mood and affect. Behavior is normal. EENT: Pupils normal.  Conjunctivae are normal. No scleral icterus. Neck supple. No cervical LAD. Cardiovascular: Normal rate, regular rhythm. No edema Pulmonary/chest: Effort normal and breath sounds normal. No wheezing, rales or rhonchi. Abdominal: Soft, nondistended, nontender. Bowel sounds active throughout. There are no masses palpable. No hepatomegaly. Neurological: Alert and oriented to person place and time. Skin: Skin is warm and dry. No rashes noted.   ASSESSMENT AND PLAN;   Harry Baker is a 72 y.o. male presenting with:  1) Beltching, regurgitation: History of both typical and atypical reflux symptoms along with abdominal bloating, improved with PPI therapy, although has concerns about chronic acid suppression use.  Additionally has had clinical improvement with dietary modifications.  Given the infrequent nature of his symptoms, along with relative predictability of exacerbation with dietary indiscretion, timing of p.o. intake, etc., agree with changing to as needed use Protonix. - Take Protonix 40 mg 30 to 60 minutes prior to anticipated exacerbation of symptoms - Resume dietary modifications - Resume to reflux lifestyle measures, to include avoid eating  close to bedtime as much as possible - No indication for endoscopic evaluation at this time  2) diverticulosis: Colonoscopy 2011 notable for diverticulosis.  Has not had complications from this.  No further intervention needed at this time.  3) colon cancer screening: Last colonoscopy in 2011 with a single small inflammatory polyp.  Recommend a repeat in 10 years. -Repeat colonoscopy in 2021  4) gallbladder sludge: Recent RUQ ultrasound with GB sludge without cholelithiasis, choledocholithiasis, duct dilatation.  Ultrasound also notable for increased echotexture, suggestive of fatty liver infiltration.  Normal  LAEs, no clinical or serologic evidence of impaired hepatic synthetic function, and otherwise without biliary symptoms.  No further evaluation needed for this at this time.  Return to clinic as needed.   Lavena Bullion, DO, FACG  07/04/2018, 3:41 PM   Colon Branch, MD

## 2018-07-04 NOTE — Telephone Encounter (Signed)
Patient called to check on scheduling for fiducial markers/space Oar. I have left messages at [facility] 9/11 and 9/13 and now 9/16 with Marlowe Kays. Called patient back to explain.

## 2018-07-04 NOTE — Patient Instructions (Addendum)
If you are age 71 or older, your body mass index should be between 23-30. Your Body mass index is 34.52 kg/m. If this is out of the aforementioned range listed, please consider follow up with your Primary Care Provider.  If you are age 62 or younger, your body mass index should be between 19-25. Your Body mass index is 34.52 kg/m. If this is out of the aformentioned range listed, please consider follow up with your Primary Care Provider.   Follow-up as needed.  It was a pleasure to see you today!  Vito Cirigliano, D.O.

## 2018-07-06 NOTE — Progress Notes (Addendum)
Subjective:   Harry Baker is a 72 y.o. male who presents for Medicare Annual/Subsequent preventive examination.  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  Review of Systems: No ROS.  Medicare Wellness Visit. Additional risk factors are reflected in the social history. Cardiac Risk Factors include: advanced age (>1men, >43 women);diabetes mellitus;dyslipidemia;male gender;obesity (BMI >30kg/m2) Sleep patterns: no issues Home Safety/Smoke Alarms: Feels safe in home. Smoke alarms in place. Lives in 2 story home.  Lives with wife on first floor.   Eye- Dr.Evans yearly. utd per pt.  Male:   CCS- last 12/13/09. Recall 10 yrs     PSA-  Lab Results  Component Value Date   PSA 14.65 (H) 01/12/2018   PSA 4.41 (H) 01/04/2017   PSA 1.05 11/29/2013       Objective:    Vitals: BP 138/86 (BP Location: Left Arm, Patient Position: Sitting, Cuff Size: Normal)   Pulse 78   Ht 5\' 8"  (1.727 m)   Wt 225 lb 12.8 oz (102.4 kg)   SpO2 96%   BMI 34.33 kg/m   Body mass index is 34.33 kg/m.  Advanced Directives 07/08/2018  Does Patient Have a Medical Advance Directive? No  Would patient like information on creating a medical advance directive? Yes (MAU/Ambulatory/Procedural Areas - Information given)    Tobacco Social History   Tobacco Use  Smoking Status Light Tobacco Smoker  . Types: Cigars  Smokeless Tobacco Never Used  Tobacco Comment   occ cigar     Ready to quit: Not Answered Counseling given: Not Answered Comment: occ cigar   Clinical Intake: Pain : No/denies pain    Past Medical History:  Diagnosis Date  . CAD (coronary artery disease)    s/p stent 6/08(-) myoview, 8/11 negative stress test  . Diabetes mellitus   . Diverticulosis of colon   . Elevated PSA   . ERECTILE DYSFUNCTION   . GERD   . Hemorrhoid    problems off and on  . Herniated disc    sess chiropractor 2010  . HOH (hard of hearing)   . Hyperlipidemia   . Prostate cancer ([place])   . Prostate  cancer ([place])   . Vocal cord polyp    Past Surgical History:  Procedure Laterality Date  . BIOPSY PROSTATE  02/15/2018  . MOUTH SURGERY  2010  . TONSILLECTOMY    . TRANSRECTAL ULTRASOUND  02/15/2018  . VC cyst removal     Family History  Problem Relation Age of Onset  . Heart failure Mother   . Stroke Father   . Prostate cancer Other        uncle  . Lung cancer Brother   . Colon cancer Other        great aunt  . Heart attack Other        GF  . Diabetes Sister    Social History   Socioeconomic History  . Marital status: Married    Spouse name: Not on file  . Number of children: 1  . Years of education: Not on file  . Highest education level: Not on file  Occupational History  . 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 Financial resource strain: Not on file  . Food insecurity:    Worry: Not on file    Inability: Not on file  . Transportation needs:    Medical: Not on file    Non-medical: Not on file  Tobacco  Use  . Smoking status: Light Tobacco Smoker    Types: Cigars  . Smokeless tobacco: Never Used  . Tobacco comment: occ cigar  Substance and Sexual Activity  . Alcohol use: Yes    Comment: wine socially  . Drug use: No  . Sexual activity: Not on file  Lifestyle  . Physical activity:    Days per week: Not on file    Minutes per session: Not on file  . Stress: Not on file  Relationships  . Social connections:    Talks on phone: Not on file    Gets together: Not on file    Attends religious service: Not on file    Active member of club or organization: Not on file    Attends meetings of clubs or organizations: Not on file    Relationship status: Not on file  Other Topics Concern  . 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 aspirin EC 81 MG tablet Take 81 mg by mouth daily.   Marland Kitchen  atorvastatin (LIPITOR) 80 MG tablet TAKE 1 TABLET BY MOUTH EVERY DAY  . Biotin 2500 MCG CAPS Take 2,500 mcg by mouth daily.   Marland Kitchen co-enzyme Q-10 30 MG capsule Take 30 mg by mouth daily.   Marland Kitchen ezetimibe (ZETIA) 10 MG tablet TAKE 1 TABLET(10 MG) BY MOUTH DAILY  . fish oil-omega-3 fatty acids 1000 MG capsule Take 4 g by mouth daily.   Marland Kitchen GLUCOSAMINE-CHONDROITIN PO Take 1 tablet by mouth daily.  . metFORMIN (GLUCOPHAGE) 850 MG tablet Take 1 tablet (850 mg total) by mouth 3 (three) times daily with meals.  . Multiple Vitamin (MULTIVITAMIN) capsule Take 1 capsule by mouth daily.    . pantoprazole (PROTONIX) 40 MG tablet Take 1 tablet (40 mg total) by mouth daily before breakfast. (Patient taking differently: Take 40 mg by mouth daily as needed. )  . ramipril (ALTACE) 5 MG capsule Take 1 capsule (5 mg total) by mouth daily.  . sitaGLIPtin (JANUVIA) 100 MG tablet Take 1 tablet (100 mg total) by mouth daily.  . clobetasol ointment (TEMOVATE) 6.27 % Apply 1 application topically 2 (two) times daily. (Patient not taking: Reported on 07/08/2018)  . nitroGLYCERIN (NITROSTAT) 0.4 MG SL tablet Place 0.4 mg under the tongue every 5 (five) minutes as needed for chest pain. Reported on 01/02/2016   No facility-administered encounter medications on file as of 07/08/2018.     Activities of Daily Living In your present state of health, do you have any difficulty performing the following activities: 07/08/2018  Hearing? Y  Comment wearing bilateral hearing aids.   Vision? N  Difficulty concentrating or making decisions? N  Walking or climbing stairs? N  Dressing or bathing? N  Doing errands, shopping? N  Preparing Food and eating ? N  Using the Toilet? N  In the past six months, have you accidently leaked urine? N  Do you have problems with loss of bowel control? N  Managing your Medications? N  Managing your Finances? N  Housekeeping or managing your Housekeeping? N  Some recent data might be hidden    Patient  Care Team: Colon Branch, MD as PCP - General Inda Castle, MD (Inactive) as Consulting Physician (Gastroenterology) Lelon Perla, MD as Consulting Physician (Cardiology) Kathie Rhodes, MD as Consulting Physician (Urology)   Assessment:  This is a routine wellness examination for [place]. Physical assessment deferred to PCP.  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 HEMOGLOBIN A1C < 7.0    . Weight (lb) < 200 lb (90.7 kg)       Fall Risk Fall Risk  07/08/2018 04/18/2018 07/07/2017 07/06/2016 01/02/2016  Falls in the past year? No No No No No   Depression Screen PHQ 2/9 Scores 07/08/2018 04/18/2018 07/07/2017 07/06/2016  PHQ - 2 Score 0 0 0 0    Cognitive Function Ad8 score reviewed for issues:  Issues making decisions:no  Less interest in hobbies / activities:no  Repeats questions, stories (family complaining):no  Trouble using ordinary gadgets (microwave, computer, phone):no  Forgets the month or year: no  Mismanaging finances: no  Remembering appts:no  Daily problems with thinking and/or memory:no Ad8 score is=0        Immunization History  Administered Date(s) Administered  . Hep A / Hep B 05/30/2008, 06/29/2008, 11/29/2008  . Influenza Split 08/16/2012  . Influenza Whole 07/20/2007, 07/08/2010  . Influenza, High Dose Seasonal PF 07/31/2013, 07/22/2015, 07/06/2016, 07/07/2017, 06/25/2018  . Influenza,inj,Quad PF,6+ Mos 07/31/2014  . Pneumococcal Conjugate-13 12/17/2014  . Pneumococcal Polysaccharide-23 07/25/2002, 08/26/2007, 09/01/2012  . Td 07/25/2002  . Tdap 11/23/2011    . Zoster 08/28/2008   Screening Tests Health Maintenance  Topic Date Due  . FOOT EXAM  07/07/2018  . OPHTHALMOLOGY EXAM  08/09/2018  . HEMOGLOBIN A1C  11/18/2018  . COLONOSCOPY  12/14/2019  . TETANUS/TDAP  11/22/2021  . INFLUENZA VACCINE  Completed  . Hepatitis C Screening  Completed  . PNA vac Low Risk Adult  Completed        Plan:    Please schedule your next medicare wellness visit with me in 1 yr.  Continue to eat heart healthy diet (full of fruits, vegetables, whole grains, lean protein, water--limit salt, fat, and sugar intake) and increase physical activity as tolerated.  Bring a copy of your living will and/or healthcare power of attorney to your next office visit.    I have personally reviewed and noted the following in the patient's chart:   . Medical and social history . Use of alcohol, tobacco or illicit drugs  . Current medications and supplements . Functional ability and status . Nutritional status . Physical activity . Advanced directives . List of other physicians . Hospitalizations, surgeries, and ER visits in previous 12 months . Vitals . Screenings to include cognitive, depression, and falls . Referrals and appointments  In addition, I have reviewed and discussed with patient certain preventive protocols, quality metrics, and best practice recommendations. A written personalized care plan for preventive services as well as general preventive health recommendations were provided to patient.     Naaman Plummer [place], [place]  07/08/2018  Kathlene November, MD

## 2018-07-08 ENCOUNTER — Encounter: Payer: Self-pay | Admitting: *Deleted

## 2018-07-08 ENCOUNTER — Ambulatory Visit (INDEPENDENT_AMBULATORY_CARE_PROVIDER_SITE_OTHER): Payer: Medicare Other | Admitting: *Deleted

## 2018-07-08 VITALS — BP 138/86 | HR 78 | Ht 68.0 in | Wt 225.8 lb

## 2018-07-08 DIAGNOSIS — Z0000 Encounter for general adult medical examination without abnormal findings: Secondary | ICD-10-CM

## 2018-07-08 NOTE — Patient Instructions (Signed)
Please schedule your next medicare wellness visit with me in 1 yr.  Continue to eat heart healthy diet (full of fruits, vegetables, whole grains, lean protein, water--limit salt, fat, and sugar intake) and increase physical activity as tolerated.  Bring a copy of your living will and/or healthcare power of attorney to your next office visit.   Harry Baker , Thank you for taking time to come for your Medicare Wellness Visit. I appreciate your ongoing commitment to your health goals. Please review the following plan we discussed and let me know if I can assist you in the future.   These are the goals we discussed: Goals    . HEMOGLOBIN A1C < 7.0    . Weight (lb) < 200 lb (90.7 kg)       This is a list of the screening recommended for you and due dates:  Health Maintenance  Topic Date Due  . Complete foot exam   07/07/2018  . Eye exam for diabetics  08/09/2018  . Hemoglobin A1C  11/18/2018  . Colon Cancer Screening  12/14/2019  . Tetanus Vaccine  11/22/2021  . Flu Shot  Completed  .  Hepatitis C: One time screening is recommended by Center for Disease Control  (CDC) for  adults born from [birth year] through 1964.   Completed  . Pneumonia vaccines  Completed    Health Maintenance, Male A healthy lifestyle and preventive care is important for your health and wellness. Ask your health care provider about what schedule of regular examinations is right for you. What should I know about weight and diet? Eat a Healthy Diet  Eat plenty of vegetables, fruits, whole grains, low-fat dairy products, and lean protein.  Do not eat a lot of foods high in solid fats, added sugars, or salt.  Maintain a Healthy Weight Regular exercise can help you achieve or maintain a healthy weight. You should:  Do at least 150 minutes of exercise each week. The exercise should increase your heart rate and make you sweat (moderate-intensity exercise).  Do strength-training exercises at least twice a week.  Watch  Your Levels of Cholesterol and Blood Lipids  Have your blood tested for lipids and cholesterol every 5 years starting at 71 years of age. If you are at high risk for heart disease, you should start having your blood tested when you are 71 years old. You may need to have your cholesterol levels checked more often if: ? Your lipid or cholesterol levels are high. ? You are older than 71 years of age. ? You are at high risk for heart disease.  What should I know about cancer screening? Many types of cancers can be detected early and may often be prevented. Lung Cancer  You should be screened every year for lung cancer if: ? You are a current smoker who has smoked for at least 30 years. ? You are a former smoker who has quit within the past 15 years.  Talk to your health care provider about your screening options, when you should start screening, and how often you should be screened.  Colorectal Cancer  Routine colorectal cancer screening usually begins at 71 years of age and should be repeated every 5-10 years until you are 70 years old. You may need to be screened more often if early forms of precancerous polyps or small growths are found. Your health care provider may recommend screening at an earlier age if you have risk factors for colon cancer.  Your health care  provider may recommend using home test kits to check for hidden blood in the stool.  A small camera at the end of a tube can be used to examine your colon (sigmoidoscopy or colonoscopy). This checks for the earliest forms of colorectal cancer.  Prostate and Testicular Cancer  Depending on your age and overall health, your health care provider may do certain tests to screen for prostate and testicular cancer.  Talk to your health care provider about any symptoms or concerns you have about testicular or prostate cancer.  Skin Cancer  Check your skin from head to toe regularly.  Tell your health care provider about any new  moles or changes in moles, especially if: ? There is a change in a mole's size, shape, or color. ? You have a mole that is larger than a pencil eraser.  Always use sunscreen. Apply sunscreen liberally and repeat throughout the day.  Protect yourself by wearing long sleeves, pants, a wide-brimmed hat, and sunglasses when outside.  What should I know about heart disease, diabetes, and high blood pressure?  If you are 14-6 years of age, have your blood pressure checked every 3-5 years. If you are 1 years of age or older, have your blood pressure checked every year. You should have your blood pressure measured twice-once when you are at a hospital or clinic, and once when you are not at a hospital or clinic. Record the average of the two measurements. To check your blood pressure when you are not at a hospital or clinic, you can use: ? An automated blood pressure machine at a pharmacy. ? A home blood pressure monitor.  Talk to your health care provider about your target blood pressure.  If you are between 50-81 years old, ask your health care provider if you should take aspirin to prevent heart disease.  Have regular diabetes screenings by checking your fasting blood sugar level. ? If you are at a normal weight and have a low risk for diabetes, have this test once every three years after the age of 74. ? If you are overweight and have a high risk for diabetes, consider being tested at a younger age or more often.  A one-time screening for abdominal aortic aneurysm (AAA) by ultrasound is recommended for men aged 69-75 years who are current or former smokers. What should I know about preventing infection? Hepatitis B If you have a higher risk for hepatitis B, you should be screened for this virus. Talk with your health care provider to find out if you are at risk for hepatitis B infection. Hepatitis C Blood testing is recommended for:  Everyone born from [birth year] through 1964.  Anyone with  known risk factors for hepatitis C.  Sexually Transmitted Diseases (STDs)  You should be screened each year for STDs including gonorrhea and chlamydia if: ? You are sexually active and are younger than 71 years of age. ? You are older than 71 years of age and your health care provider tells you that you are at risk for this type of infection. ? Your sexual activity has changed since you were last screened and you are at an increased risk for chlamydia or gonorrhea. Ask your health care provider if you are at risk.  Talk with your health care provider about whether you are at high risk of being infected with HIV. Your health care provider may recommend a prescription medicine to help prevent HIV infection.  What else can I do?  Schedule regular  health, dental, and eye exams.  Stay current with your vaccines (immunizations).  Do not use any tobacco products, such as cigarettes, chewing tobacco, and e-cigarettes. If you need help quitting, ask your health care provider.  Limit alcohol intake to no more than 2 drinks per day. One drink equals 12 ounces of beer, 5 ounces of wine, or 1 ounces of hard liquor.  Do not use street drugs.  Do not share needles.  Ask your health care provider for help if you need support or information about quitting drugs.  Tell your health care provider if you often feel depressed.  Tell your health care provider if you have ever been abused or do not feel safe at home. This information is not intended to replace advice given to you by your health care provider. Make sure you discuss any questions you have with your health care provider. Document Released: 04/02/2008 Document Revised: 06/03/2016 Document Reviewed: 07/09/2015 Elsevier Interactive Patient Education  [employer].

## 2018-07-11 ENCOUNTER — Telehealth: Payer: Self-pay | Admitting: *Deleted

## 2018-07-11 NOTE — Telephone Encounter (Signed)
RETURNED PATIENT'S PHONE CALL, SPOKE WITH PATIENT. ?

## 2018-07-18 ENCOUNTER — Telehealth: Payer: Self-pay | Admitting: Medical Oncology

## 2018-07-18 ENCOUNTER — Telehealth: Payer: Self-pay | Admitting: *Deleted

## 2018-07-18 NOTE — Telephone Encounter (Signed)
CALLED PATIENT TO INFORM OF SIM APPT. ON 08-05-18 @ 8 AM , LVM FOR A RETURN CALL

## 2018-07-18 NOTE — Telephone Encounter (Signed)
[facility] scheduled patient for gold markers/SpaceOAR 08/01/18. I called Cookie-wife to update her with this appointment. She states that AU just spoke with her husband with date and they are thrilled to be scheduled. I explained that Enid Derry will call him with CT simulation appointment later today. She voiced understanding. I asked her to call me with concerns.

## 2018-07-18 NOTE — Telephone Encounter (Signed)
Wife left a message stating that her husband is very anxious and upset due to t delay in treatment. He is waiting to be scheduled for gold markers and SpaceOAR.  She asked if I could help get this appointment scheduled. I spoke with Enid Derry and she is waiting for AU to return her call. She reached out again and left message. I will call wife when I have an update.

## 2018-07-25 ENCOUNTER — Other Ambulatory Visit: Payer: Self-pay | Admitting: Cardiology

## 2018-07-25 DIAGNOSIS — I2510 Atherosclerotic heart disease of native coronary artery without angina pectoris: Secondary | ICD-10-CM

## 2018-07-29 ENCOUNTER — Telehealth: Payer: Self-pay | Admitting: *Deleted

## 2018-07-29 NOTE — Telephone Encounter (Signed)
CALLED PATIENT TO INFORM OF SIM APPT. FOR 08-05-18, LVM FOR A RETURN CALL

## 2018-08-01 ENCOUNTER — Other Ambulatory Visit: Payer: Self-pay | Admitting: Urology

## 2018-08-01 DIAGNOSIS — C61 Malignant neoplasm of prostate: Secondary | ICD-10-CM | POA: Diagnosis not present

## 2018-08-02 ENCOUNTER — Telehealth: Payer: Self-pay | Admitting: *Deleted

## 2018-08-02 NOTE — Telephone Encounter (Signed)
CALLED PATIENT TO INFORM OF APPTS. FOR 08-05-18, LVM FOR A RETURN CALL

## 2018-08-05 ENCOUNTER — Ambulatory Visit (HOSPITAL_COMMUNITY)
Admission: RE | Admit: 2018-08-05 | Discharge: 2018-08-05 | Disposition: A | Discharge status: Home / Self Care | Payer: Medicare Other | Source: Ambulatory Visit | Attending: Urology | Admitting: Urology

## 2018-08-05 ENCOUNTER — Ambulatory Visit
Admission: RE | Admit: 2018-08-05 | Discharge: 2018-08-05 | Disposition: A | Discharge status: Home / Self Care | Payer: Medicare Other | Source: Ambulatory Visit | Attending: Radiation Oncology | Admitting: Radiation Oncology

## 2018-08-05 DIAGNOSIS — C61 Malignant neoplasm of prostate: Secondary | ICD-10-CM | POA: Diagnosis not present

## 2018-08-05 DIAGNOSIS — Z510 Encounter for antineoplastic radiation therapy: Secondary | ICD-10-CM | POA: Insufficient documentation

## 2018-08-05 NOTE — Progress Notes (Addendum)
  Radiation Oncology         [phone] ________________________________  Name: Harry Baker MRN: [id number]  Date: 08/05/2018  DOB: 09-25-1946  SIMULATION AND TREATMENT PLANNING NOTE    ICD-10-CM   1. Prostate cancer [facility]) C61     DIAGNOSIS:  72 y.o. gentleman with unfavorable intermediate risk, Stage T1c adenocarcinoma of the prostate with Gleason Score of 4+3, and PSA of 14.65  NARRATIVE:  The patient was brought to the [facility].  Identity was confirmed.  All relevant records and images related to the planned course of therapy were reviewed.  The patient freely provided informed written consent to proceed with treatment after reviewing the details related to the planned course of therapy. The consent form was witnessed and verified by the simulation staff.  Then, the patient was set-up in a stable reproducible supine position for radiation therapy.  A vacuum lock pillow device was custom fabricated to position his legs in a reproducible immobilized position.  Then, I performed a urethrogram under sterile conditions to identify the prostatic apex.  CT images were obtained.  Surface markings were placed.  The CT images were loaded into the planning software.  Then the prostate target and avoidance structures including the rectum, bladder, bowel and hips were contoured.  Treatment planning then occurred.  The radiation prescription was entered and confirmed.  A total of one complex treatment devices was fabricated. I have requested : Intensity Modulated Radiotherapy (IMRT) is medically necessary for this case for the following reason:  Rectal sparing.Marland Kitchen  PLAN:  The patient will receive 70 Gy in 28 fractions.  ________________________________  Sheral Apley Tammi Klippel, M.D.

## 2018-08-07 NOTE — Addendum Note (Signed)
Encounter addended by: Tyler Pita, MD on: 08/07/2018 3:32 PM  Actions taken: Allergies reviewed, Medication List reviewed, Problem List reviewed, Sign clinical note

## 2018-08-11 DIAGNOSIS — Z7984 Long term (current) use of oral hypoglycemic drugs: Secondary | ICD-10-CM | POA: Diagnosis not present

## 2018-08-11 DIAGNOSIS — H25013 Cortical age-related cataract, bilateral: Secondary | ICD-10-CM | POA: Diagnosis not present

## 2018-08-11 DIAGNOSIS — H02831 Dermatochalasis of right upper eyelid: Secondary | ICD-10-CM | POA: Diagnosis not present

## 2018-08-11 DIAGNOSIS — H31002 Unspecified chorioretinal scars, left eye: Secondary | ICD-10-CM | POA: Diagnosis not present

## 2018-08-11 DIAGNOSIS — H11153 Pinguecula, bilateral: Secondary | ICD-10-CM | POA: Diagnosis not present

## 2018-08-11 DIAGNOSIS — H52203 Unspecified astigmatism, bilateral: Secondary | ICD-10-CM | POA: Diagnosis not present

## 2018-08-11 DIAGNOSIS — H5213 Myopia, bilateral: Secondary | ICD-10-CM | POA: Diagnosis not present

## 2018-08-11 DIAGNOSIS — H02834 Dermatochalasis of left upper eyelid: Secondary | ICD-10-CM | POA: Diagnosis not present

## 2018-08-11 DIAGNOSIS — H524 Presbyopia: Secondary | ICD-10-CM | POA: Diagnosis not present

## 2018-08-11 DIAGNOSIS — H2513 Age-related nuclear cataract, bilateral: Secondary | ICD-10-CM | POA: Diagnosis not present

## 2018-08-11 DIAGNOSIS — E119 Type 2 diabetes mellitus without complications: Secondary | ICD-10-CM | POA: Diagnosis not present

## 2018-08-11 DIAGNOSIS — H35362 Drusen (degenerative) of macula, left eye: Secondary | ICD-10-CM | POA: Diagnosis not present

## 2018-08-11 LAB — HM DIABETES EYE EXAM

## 2018-08-13 ENCOUNTER — Other Ambulatory Visit: Payer: Self-pay | Admitting: Internal Medicine

## 2018-08-15 DIAGNOSIS — C61 Malignant neoplasm of prostate: Secondary | ICD-10-CM | POA: Diagnosis not present

## 2018-08-15 DIAGNOSIS — Z510 Encounter for antineoplastic radiation therapy: Secondary | ICD-10-CM | POA: Diagnosis not present

## 2018-08-17 ENCOUNTER — Encounter: Payer: Self-pay | Admitting: Medical Oncology

## 2018-08-17 ENCOUNTER — Ambulatory Visit
Admission: RE | Admit: 2018-08-17 | Discharge: 2018-08-17 | Disposition: A | Discharge status: Home / Self Care | Payer: Medicare Other | Source: Ambulatory Visit | Attending: Radiation Oncology | Admitting: Radiation Oncology

## 2018-08-17 DIAGNOSIS — C61 Malignant neoplasm of prostate: Secondary | ICD-10-CM | POA: Diagnosis not present

## 2018-08-17 DIAGNOSIS — Z510 Encounter for antineoplastic radiation therapy: Secondary | ICD-10-CM | POA: Diagnosis not present

## 2018-08-18 ENCOUNTER — Ambulatory Visit
Admission: RE | Admit: 2018-08-18 | Discharge: 2018-08-18 | Disposition: A | Discharge status: Home / Self Care | Payer: Medicare Other | Source: Ambulatory Visit | Attending: Radiation Oncology | Admitting: Radiation Oncology

## 2018-08-18 DIAGNOSIS — Z510 Encounter for antineoplastic radiation therapy: Secondary | ICD-10-CM | POA: Diagnosis not present

## 2018-08-18 DIAGNOSIS — C61 Malignant neoplasm of prostate: Secondary | ICD-10-CM | POA: Diagnosis not present

## 2018-08-19 ENCOUNTER — Ambulatory Visit
Admission: RE | Admit: 2018-08-19 | Discharge: 2018-08-19 | Disposition: A | Discharge status: Home / Self Care | Payer: Medicare Other | Source: Ambulatory Visit | Attending: Radiation Oncology | Admitting: Radiation Oncology

## 2018-08-19 DIAGNOSIS — Z510 Encounter for antineoplastic radiation therapy: Secondary | ICD-10-CM | POA: Diagnosis not present

## 2018-08-19 DIAGNOSIS — C61 Malignant neoplasm of prostate: Secondary | ICD-10-CM | POA: Diagnosis not present

## 2018-08-22 ENCOUNTER — Ambulatory Visit
Admission: RE | Admit: 2018-08-22 | Discharge: 2018-08-22 | Disposition: A | Discharge status: Home / Self Care | Payer: Medicare Other | Source: Ambulatory Visit | Attending: Radiation Oncology | Admitting: Radiation Oncology

## 2018-08-22 DIAGNOSIS — Z510 Encounter for antineoplastic radiation therapy: Secondary | ICD-10-CM | POA: Diagnosis not present

## 2018-08-22 DIAGNOSIS — C61 Malignant neoplasm of prostate: Secondary | ICD-10-CM | POA: Diagnosis not present

## 2018-08-23 ENCOUNTER — Ambulatory Visit
Admission: RE | Admit: 2018-08-23 | Discharge: 2018-08-23 | Disposition: A | Discharge status: Home / Self Care | Payer: Medicare Other | Source: Ambulatory Visit | Attending: Radiation Oncology | Admitting: Radiation Oncology

## 2018-08-23 DIAGNOSIS — C61 Malignant neoplasm of prostate: Secondary | ICD-10-CM | POA: Diagnosis not present

## 2018-08-23 DIAGNOSIS — Z510 Encounter for antineoplastic radiation therapy: Secondary | ICD-10-CM | POA: Diagnosis not present

## 2018-08-24 ENCOUNTER — Ambulatory Visit
Admission: RE | Admit: 2018-08-24 | Discharge: 2018-08-24 | Disposition: A | Discharge status: Home / Self Care | Payer: Medicare Other | Source: Ambulatory Visit | Attending: Radiation Oncology | Admitting: Radiation Oncology

## 2018-08-24 DIAGNOSIS — Z510 Encounter for antineoplastic radiation therapy: Secondary | ICD-10-CM | POA: Diagnosis not present

## 2018-08-24 DIAGNOSIS — C61 Malignant neoplasm of prostate: Secondary | ICD-10-CM | POA: Diagnosis not present

## 2018-08-25 ENCOUNTER — Ambulatory Visit
Admission: RE | Admit: 2018-08-25 | Discharge: 2018-08-25 | Disposition: A | Discharge status: Home / Self Care | Payer: Medicare Other | Source: Ambulatory Visit | Attending: Radiation Oncology | Admitting: Radiation Oncology

## 2018-08-25 DIAGNOSIS — C61 Malignant neoplasm of prostate: Secondary | ICD-10-CM | POA: Diagnosis not present

## 2018-08-25 DIAGNOSIS — Z510 Encounter for antineoplastic radiation therapy: Secondary | ICD-10-CM | POA: Diagnosis not present

## 2018-08-26 ENCOUNTER — Ambulatory Visit
Admission: RE | Admit: 2018-08-26 | Discharge: 2018-08-26 | Disposition: A | Discharge status: Home / Self Care | Payer: Medicare Other | Source: Ambulatory Visit | Attending: Radiation Oncology | Admitting: Radiation Oncology

## 2018-08-26 DIAGNOSIS — Z510 Encounter for antineoplastic radiation therapy: Secondary | ICD-10-CM | POA: Diagnosis not present

## 2018-08-26 DIAGNOSIS — C61 Malignant neoplasm of prostate: Secondary | ICD-10-CM | POA: Diagnosis not present

## 2018-08-29 ENCOUNTER — Ambulatory Visit
Admission: RE | Admit: 2018-08-29 | Discharge: 2018-08-29 | Disposition: A | Discharge status: Home / Self Care | Payer: Medicare Other | Source: Ambulatory Visit | Attending: Radiation Oncology | Admitting: Radiation Oncology

## 2018-08-29 DIAGNOSIS — Z510 Encounter for antineoplastic radiation therapy: Secondary | ICD-10-CM | POA: Diagnosis not present

## 2018-08-29 DIAGNOSIS — C61 Malignant neoplasm of prostate: Secondary | ICD-10-CM | POA: Diagnosis not present

## 2018-08-30 ENCOUNTER — Ambulatory Visit
Admission: RE | Admit: 2018-08-30 | Discharge: 2018-08-30 | Disposition: A | Discharge status: Home / Self Care | Payer: Medicare Other | Source: Ambulatory Visit | Attending: Radiation Oncology | Admitting: Radiation Oncology

## 2018-08-30 ENCOUNTER — Encounter: Payer: Self-pay | Admitting: Medical Oncology

## 2018-08-30 DIAGNOSIS — C61 Malignant neoplasm of prostate: Secondary | ICD-10-CM | POA: Diagnosis not present

## 2018-08-30 DIAGNOSIS — Z510 Encounter for antineoplastic radiation therapy: Secondary | ICD-10-CM | POA: Diagnosis not present

## 2018-08-31 ENCOUNTER — Ambulatory Visit
Admission: RE | Admit: 2018-08-31 | Discharge: 2018-08-31 | Disposition: A | Discharge status: Home / Self Care | Payer: Medicare Other | Source: Ambulatory Visit | Attending: Radiation Oncology | Admitting: Radiation Oncology

## 2018-08-31 DIAGNOSIS — C61 Malignant neoplasm of prostate: Secondary | ICD-10-CM | POA: Diagnosis not present

## 2018-08-31 DIAGNOSIS — Z510 Encounter for antineoplastic radiation therapy: Secondary | ICD-10-CM | POA: Diagnosis not present

## 2018-09-01 ENCOUNTER — Ambulatory Visit
Admission: RE | Admit: 2018-09-01 | Discharge: 2018-09-01 | Disposition: A | Discharge status: Home / Self Care | Payer: Medicare Other | Source: Ambulatory Visit | Attending: Radiation Oncology | Admitting: Radiation Oncology

## 2018-09-01 DIAGNOSIS — C61 Malignant neoplasm of prostate: Secondary | ICD-10-CM | POA: Diagnosis not present

## 2018-09-01 DIAGNOSIS — Z510 Encounter for antineoplastic radiation therapy: Secondary | ICD-10-CM | POA: Diagnosis not present

## 2018-09-01 NOTE — Progress Notes (Signed)
Harry Baker states treatment is going well. He feels his anxiety level has decreased greatly since starting treatment. No complaints and thankful for great treatment he is receiving.

## 2018-09-02 ENCOUNTER — Ambulatory Visit
Admission: RE | Admit: 2018-09-02 | Discharge: 2018-09-02 | Disposition: A | Discharge status: Home / Self Care | Payer: Medicare Other | Source: Ambulatory Visit | Attending: Radiation Oncology | Admitting: Radiation Oncology

## 2018-09-02 ENCOUNTER — Other Ambulatory Visit: Payer: Self-pay | Admitting: Radiation Oncology

## 2018-09-02 DIAGNOSIS — C61 Malignant neoplasm of prostate: Secondary | ICD-10-CM | POA: Diagnosis not present

## 2018-09-02 DIAGNOSIS — Z510 Encounter for antineoplastic radiation therapy: Secondary | ICD-10-CM | POA: Diagnosis not present

## 2018-09-02 MED ORDER — TAMSULOSIN HCL 0.4 MG PO CAPS: 0.4000 mg | ORAL_CAPSULE | Freq: Every day | ORAL | 5 refills | Status: DC

## 2018-09-05 ENCOUNTER — Ambulatory Visit
Admission: RE | Admit: 2018-09-05 | Discharge: 2018-09-05 | Disposition: A | Discharge status: Home / Self Care | Payer: Medicare Other | Source: Ambulatory Visit | Attending: Radiation Oncology | Admitting: Radiation Oncology

## 2018-09-05 DIAGNOSIS — C61 Malignant neoplasm of prostate: Secondary | ICD-10-CM | POA: Diagnosis not present

## 2018-09-05 DIAGNOSIS — Z510 Encounter for antineoplastic radiation therapy: Secondary | ICD-10-CM | POA: Diagnosis not present

## 2018-09-06 ENCOUNTER — Ambulatory Visit
Admission: RE | Admit: 2018-09-06 | Discharge: 2018-09-06 | Disposition: A | Discharge status: Home / Self Care | Payer: Medicare Other | Source: Ambulatory Visit | Attending: Radiation Oncology | Admitting: Radiation Oncology

## 2018-09-06 DIAGNOSIS — C61 Malignant neoplasm of prostate: Secondary | ICD-10-CM | POA: Diagnosis not present

## 2018-09-06 DIAGNOSIS — Z510 Encounter for antineoplastic radiation therapy: Secondary | ICD-10-CM | POA: Diagnosis not present

## 2018-09-07 ENCOUNTER — Ambulatory Visit
Admission: RE | Admit: 2018-09-07 | Discharge: 2018-09-07 | Disposition: A | Discharge status: Home / Self Care | Payer: Medicare Other | Source: Ambulatory Visit | Attending: Radiation Oncology | Admitting: Radiation Oncology

## 2018-09-07 DIAGNOSIS — C61 Malignant neoplasm of prostate: Secondary | ICD-10-CM | POA: Diagnosis not present

## 2018-09-07 DIAGNOSIS — Z510 Encounter for antineoplastic radiation therapy: Secondary | ICD-10-CM | POA: Diagnosis not present

## 2018-09-08 ENCOUNTER — Ambulatory Visit
Admission: RE | Admit: 2018-09-08 | Discharge: 2018-09-08 | Disposition: A | Discharge status: Home / Self Care | Payer: Medicare Other | Source: Ambulatory Visit | Attending: Radiation Oncology | Admitting: Radiation Oncology

## 2018-09-08 DIAGNOSIS — Z510 Encounter for antineoplastic radiation therapy: Secondary | ICD-10-CM | POA: Diagnosis not present

## 2018-09-08 DIAGNOSIS — C61 Malignant neoplasm of prostate: Secondary | ICD-10-CM | POA: Diagnosis not present

## 2018-09-09 ENCOUNTER — Ambulatory Visit
Admission: RE | Admit: 2018-09-09 | Discharge: 2018-09-09 | Disposition: A | Discharge status: Home / Self Care | Payer: Medicare Other | Source: Ambulatory Visit | Attending: Radiation Oncology | Admitting: Radiation Oncology

## 2018-09-09 DIAGNOSIS — Z510 Encounter for antineoplastic radiation therapy: Secondary | ICD-10-CM | POA: Diagnosis not present

## 2018-09-09 DIAGNOSIS — C61 Malignant neoplasm of prostate: Secondary | ICD-10-CM | POA: Diagnosis not present

## 2018-09-12 ENCOUNTER — Ambulatory Visit
Admission: RE | Admit: 2018-09-12 | Discharge: 2018-09-12 | Disposition: A | Discharge status: Home / Self Care | Payer: Medicare Other | Source: Ambulatory Visit | Attending: Radiation Oncology | Admitting: Radiation Oncology

## 2018-09-12 DIAGNOSIS — Z510 Encounter for antineoplastic radiation therapy: Secondary | ICD-10-CM | POA: Diagnosis not present

## 2018-09-12 DIAGNOSIS — C61 Malignant neoplasm of prostate: Secondary | ICD-10-CM | POA: Diagnosis not present

## 2018-09-13 ENCOUNTER — Ambulatory Visit
Admission: RE | Admit: 2018-09-13 | Discharge: 2018-09-13 | Disposition: A | Discharge status: Home / Self Care | Payer: Medicare Other | Source: Ambulatory Visit | Attending: Radiation Oncology | Admitting: Radiation Oncology

## 2018-09-13 DIAGNOSIS — C61 Malignant neoplasm of prostate: Secondary | ICD-10-CM | POA: Diagnosis not present

## 2018-09-13 DIAGNOSIS — Z510 Encounter for antineoplastic radiation therapy: Secondary | ICD-10-CM | POA: Diagnosis not present

## 2018-09-14 ENCOUNTER — Ambulatory Visit
Admission: RE | Admit: 2018-09-14 | Discharge: 2018-09-14 | Disposition: A | Discharge status: Home / Self Care | Payer: Medicare Other | Source: Ambulatory Visit | Attending: Radiation Oncology | Admitting: Radiation Oncology

## 2018-09-14 DIAGNOSIS — Z510 Encounter for antineoplastic radiation therapy: Secondary | ICD-10-CM | POA: Diagnosis not present

## 2018-09-14 DIAGNOSIS — C61 Malignant neoplasm of prostate: Secondary | ICD-10-CM | POA: Diagnosis not present

## 2018-09-19 ENCOUNTER — Ambulatory Visit
Admission: RE | Admit: 2018-09-19 | Discharge: 2018-09-19 | Disposition: A | Discharge status: Home / Self Care | Payer: Medicare Other | Source: Ambulatory Visit | Attending: Radiation Oncology | Admitting: Radiation Oncology

## 2018-09-19 DIAGNOSIS — C61 Malignant neoplasm of prostate: Secondary | ICD-10-CM | POA: Diagnosis not present

## 2018-09-19 DIAGNOSIS — Z510 Encounter for antineoplastic radiation therapy: Secondary | ICD-10-CM | POA: Insufficient documentation

## 2018-09-20 ENCOUNTER — Ambulatory Visit
Admission: RE | Admit: 2018-09-20 | Discharge: 2018-09-20 | Disposition: A | Discharge status: Home / Self Care | Payer: Medicare Other | Source: Ambulatory Visit | Attending: Radiation Oncology | Admitting: Radiation Oncology

## 2018-09-20 DIAGNOSIS — Z510 Encounter for antineoplastic radiation therapy: Secondary | ICD-10-CM | POA: Diagnosis not present

## 2018-09-20 DIAGNOSIS — C61 Malignant neoplasm of prostate: Secondary | ICD-10-CM | POA: Diagnosis not present

## 2018-09-21 ENCOUNTER — Ambulatory Visit
Admission: RE | Admit: 2018-09-21 | Discharge: 2018-09-21 | Disposition: A | Discharge status: Home / Self Care | Payer: Medicare Other | Source: Ambulatory Visit | Attending: Radiation Oncology | Admitting: Radiation Oncology

## 2018-09-21 DIAGNOSIS — Z510 Encounter for antineoplastic radiation therapy: Secondary | ICD-10-CM | POA: Diagnosis not present

## 2018-09-21 DIAGNOSIS — C61 Malignant neoplasm of prostate: Secondary | ICD-10-CM | POA: Diagnosis not present

## 2018-09-22 ENCOUNTER — Ambulatory Visit
Admission: RE | Admit: 2018-09-22 | Discharge: 2018-09-22 | Disposition: A | Discharge status: Home / Self Care | Payer: Medicare Other | Source: Ambulatory Visit | Attending: Radiation Oncology | Admitting: Radiation Oncology

## 2018-09-22 DIAGNOSIS — C61 Malignant neoplasm of prostate: Secondary | ICD-10-CM | POA: Diagnosis not present

## 2018-09-22 DIAGNOSIS — Z510 Encounter for antineoplastic radiation therapy: Secondary | ICD-10-CM | POA: Diagnosis not present

## 2018-09-23 ENCOUNTER — Ambulatory Visit
Admission: RE | Admit: 2018-09-23 | Discharge: 2018-09-23 | Disposition: A | Discharge status: Home / Self Care | Payer: Medicare Other | Source: Ambulatory Visit | Attending: Radiation Oncology | Admitting: Radiation Oncology

## 2018-09-23 DIAGNOSIS — C61 Malignant neoplasm of prostate: Secondary | ICD-10-CM | POA: Diagnosis not present

## 2018-09-23 DIAGNOSIS — Z510 Encounter for antineoplastic radiation therapy: Secondary | ICD-10-CM | POA: Diagnosis not present

## 2018-09-26 ENCOUNTER — Ambulatory Visit
Admission: RE | Admit: 2018-09-26 | Discharge: 2018-09-26 | Disposition: A | Discharge status: Home / Self Care | Payer: Medicare Other | Source: Ambulatory Visit | Attending: Radiation Oncology | Admitting: Radiation Oncology

## 2018-09-26 DIAGNOSIS — Z510 Encounter for antineoplastic radiation therapy: Secondary | ICD-10-CM | POA: Diagnosis not present

## 2018-09-26 DIAGNOSIS — C61 Malignant neoplasm of prostate: Secondary | ICD-10-CM | POA: Diagnosis not present

## 2018-09-27 ENCOUNTER — Encounter: Payer: Self-pay | Admitting: Radiation Oncology

## 2018-09-27 ENCOUNTER — Ambulatory Visit
Admission: RE | Admit: 2018-09-27 | Discharge: 2018-09-27 | Disposition: A | Discharge status: Home / Self Care | Payer: Medicare Other | Source: Ambulatory Visit | Attending: Radiation Oncology | Admitting: Radiation Oncology

## 2018-09-27 DIAGNOSIS — Z510 Encounter for antineoplastic radiation therapy: Secondary | ICD-10-CM | POA: Diagnosis not present

## 2018-09-27 DIAGNOSIS — C61 Malignant neoplasm of prostate: Secondary | ICD-10-CM | POA: Diagnosis not present

## 2018-09-29 NOTE — Progress Notes (Signed)
  Radiation Oncology         [phone] ________________________________  Name: Harry Baker MRN: [id number]  Date: 09/27/2018  DOB: 11/26/1945  End of Treatment Note  Diagnosis:   72 y.o. male with unfavorable intermediate risk, Stage T1c adenocarcinoma of the prostate with Gleason Score of 4+3, and PSA of 14.65    Indication for treatment:  Curative, Definitive Radiotherapy       Radiation treatment dates:   08/17/2018 - 09/27/2018  Site/dose:   The prostate was treated to 70 Gy in 28 fractions of 2.5 Gy  Beams/energy:   The patient was treated with IMRT using volumetric arc therapy delivering 6 MV X-rays to clockwise and counterclockwise circumferential arcs with a 90 degree collimator offset to avoid dose scalloping.  Image guidance was performed with daily cone beam CT prior to each fraction to align to gold markers in the prostate and assure proper bladder and rectal fill volumes.  Immobilization was achieved with BodyFix custom mold.  Narrative: The patient tolerated radiation treatment relatively well.   The patient experienced modest fatigue and some minor urinary irritation with urgency, nocturia x2 with weak stream, and dysuria. He denied hematuria. Towards the end of treatment he noted intermittent diarrhea with urgency and new onset rectal burning with scant blood. Discussed usage of Prep H, Tucks wipes, sitz baths, and Imodium.   Plan: The patient has completed radiation treatment. He will return to radiation oncology clinic for routine followup in one month. I advised him to call or return sooner if he has any questions or concerns related to his recovery or treatment. ________________________________  Sheral Apley. Tammi Klippel, M.D.  This document serves as a record of services personally performed by Tyler Pita, MD. It was created on his behalf by Rae Lips, a trained medical scribe. The creation of this record is based on the scribe's personal observations and the  provider's statements to them. This document has been checked and approved by the attending provider.

## 2018-10-20 DIAGNOSIS — C61 Malignant neoplasm of prostate: Secondary | ICD-10-CM | POA: Diagnosis not present

## 2018-10-20 LAB — PSA: PSA: <0.015

## 2018-10-23 ENCOUNTER — Other Ambulatory Visit: Payer: Self-pay | Admitting: Cardiology

## 2018-10-25 DIAGNOSIS — R3912 Poor urinary stream: Secondary | ICD-10-CM | POA: Diagnosis not present

## 2018-10-25 DIAGNOSIS — N401 Enlarged prostate with lower urinary tract symptoms: Secondary | ICD-10-CM | POA: Diagnosis not present

## 2018-10-25 DIAGNOSIS — Z8546 Personal history of malignant neoplasm of prostate: Secondary | ICD-10-CM | POA: Diagnosis not present

## 2018-10-26 ENCOUNTER — Encounter: Payer: Self-pay | Admitting: Internal Medicine

## 2018-10-26 ENCOUNTER — Ambulatory Visit (INDEPENDENT_AMBULATORY_CARE_PROVIDER_SITE_OTHER): Payer: Medicare Other | Admitting: Internal Medicine

## 2018-10-26 ENCOUNTER — Ambulatory Visit: Payer: Medicare Other | Admitting: Internal Medicine

## 2018-10-26 VITALS — BP 132/68 | HR 97 | Temp 98.6°F | Resp 16 | Ht 68.0 in | Wt 226.0 lb

## 2018-10-26 DIAGNOSIS — R1013 Epigastric pain: Secondary | ICD-10-CM

## 2018-10-26 DIAGNOSIS — E1159 Type 2 diabetes mellitus with other circulatory complications: Secondary | ICD-10-CM

## 2018-10-26 DIAGNOSIS — C61 Malignant neoplasm of prostate: Secondary | ICD-10-CM | POA: Diagnosis not present

## 2018-10-26 DIAGNOSIS — E7800 Pure hypercholesterolemia, unspecified: Secondary | ICD-10-CM

## 2018-10-26 LAB — BASIC METABOLIC PANEL
BUN: 16 mg/dL (ref 6–23)
CALCIUM: 9.6 mg/dL (ref 8.4–10.5)
CO2: 29 meq/L (ref 19–32)
CREATININE: 0.82 mg/dL (ref 0.40–1.50)
Chloride: 104 mEq/L (ref 96–112)
GFR: 98.22 mL/min (ref >60.00)
Glucose, Bld: 104 mg/dL — ABNORMAL HIGH (ref 70–99)
Potassium: 4.3 mEq/L (ref 3.5–5.1)
Sodium: 139 mEq/L (ref 135–145)

## 2018-10-26 LAB — HEMOGLOBIN A1C: Hgb A1c MFr Bld: 6.4 % (ref 4.6–6.5)

## 2018-10-26 MED ORDER — EZETIMIBE 10 MG PO TABS: ORAL_TABLET | ORAL | 3 refills | Status: DC

## 2018-10-26 NOTE — Progress Notes (Signed)
Pre visit review using our clinic review tool, if applicable. No additional management support is needed unless otherwise documented below in the visit note. 

## 2018-10-26 NOTE — Progress Notes (Signed)
Subjective:    Patient ID: Harry Baker, male    DOB: [birth year], 73 y.o.   MRN: [id number]  DOS:  10/26/2018 Type of visit - description: Routine visit DM: Good compliance with medication, concerned about the cost of Januvia Prostate cancer: Under the care of urology, he updated me on his situation Dyspepsia: Note from GI reviewed. High cholesterol: Needs a refill on Zetia    Review of Systems Reports no chest pain Had a URI/bronchitis during Christmas, doing better.  At the time has shortness of breath, that is resolved No nausea, vomiting, diarrhea No lower extremity paresthesias  Past Medical History:  Diagnosis Date  . CAD (coronary artery disease)    s/p stent 6/08(-) myoview, 8/11 negative stress test  . Diabetes mellitus   . Diverticulosis of colon   . Elevated PSA   . ERECTILE DYSFUNCTION   . GERD   . Hemorrhoid    problems off and on  . Herniated disc    sess chiropractor 2010  . HOH (hard of hearing)   . Hyperlipidemia   . Prostate cancer ([place])   . Prostate cancer ([place])   . Vocal cord polyp     Past Surgical History:  Procedure Laterality Date  . BIOPSY PROSTATE  02/15/2018  . MOUTH SURGERY  2010  . TONSILLECTOMY    . TRANSRECTAL ULTRASOUND  02/15/2018  . VC cyst removal      Social History   Socioeconomic History  . Marital status: Married    Spouse name: Not on file  . Number of children: 1  . Years of education: Not on file  . Highest education level: Not on file  Occupational History  . 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 Financial resource strain: Not on file  . Food insecurity:    Worry: Not on file    Inability: Not on file  . Transportation needs:    Medical: Not on file    Non-medical: Not on file  Tobacco Use  . Smoking status: Light Tobacco Smoker    Types: Cigars  . Smokeless tobacco: Never Used  . Tobacco comment: occ cigar  Substance and Sexual Activity  . Alcohol  use: Yes    Comment: wine socially  . Drug use: No  . Sexual activity: Not on file  Lifestyle  . Physical activity:    Days per week: Not on file    Minutes per session: Not on file  . Stress: Not on file  Relationships  . Social connections:    Talks on phone: Not on file    Gets together: Not on file    Attends religious service: Not on file    Active member of club or organization: Not on file    Attends meetings of clubs or organizations: Not on file    Relationship status: Not on file  . Intimate partner violence:    Fear of current or ex partner: Not on file    Emotionally abused: Not on file    Physically abused: Not on file    Forced sexual activity: Not on file  Other Topics Concern  . 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 Always use your most recent med list.        aspirin EC 81 MG tablet Take 81 mg by mouth daily.   atorvastatin 80 MG tablet Commonly known as:  LIPITOR TAKE 1 TABLET BY MOUTH EVERY DAY   Biotin 2500 MCG Caps Take 2,500 mcg by mouth daily.   clobetasol ointment 0.05 % Commonly known as:  TEMOVATE Apply 1 application topically 2 (two) times daily.   co-enzyme Q-10 30 MG capsule Take 30 mg by mouth daily.   ezetimibe 10 MG tablet Commonly known as:  ZETIA TAKE 1 TABLET(10 MG) BY MOUTH DAILY   fish oil-omega-3 fatty acids 1000 MG capsule Take 4 g by mouth daily.   GLUCOSAMINE-CHONDROITIN PO Take 1 tablet by mouth daily.   metFORMIN 850 MG tablet Commonly known as:  GLUCOPHAGE Take 1 tablet (850 mg total) by mouth 3 (three) times daily with meals.   multivitamin capsule Take 1 capsule by mouth daily.   nitroGLYCERIN 0.4 MG SL tablet Commonly known as:  NITROSTAT Place 0.4 mg under the tongue every 5  (five) minutes as needed for chest pain. Reported on 01/02/2016   pantoprazole 40 MG tablet Commonly known as:  PROTONIX Take 1 tablet (40 mg total) by mouth daily before breakfast.   ramipril 5 MG capsule Commonly known as:  ALTACE Take 1 capsule (5 mg total) by mouth daily.   sitaGLIPtin 100 MG tablet Commonly known as:  JANUVIA Take 1 tablet (100 mg total) by mouth daily.   tamsulosin 0.4 MG Caps capsule Commonly known as:  FLOMAX Take 1 capsule (0.4 mg total) by mouth daily after supper.           Objective:   Physical Exam BP 132/68 (BP Location: Left Arm, Patient Position: Sitting, Cuff Size: Normal)   Pulse 97   Temp 98.6 F (37 C) (Oral)   Resp 16   Ht 5\' 8"  (1.727 m)   Wt 226 lb (102.5 kg)   SpO2 97%   BMI 34.36 kg/m  General:   Well developed, NAD, BMI noted. HEENT:  Normocephalic . Face symmetric, atraumatic Lungs:  CTA B Normal respiratory effort, no intercostal retractions, no accessory muscle use. Heart: RRR,  no murmur.  No pretibial edema bilaterally  DIABETIC FEET EXAM: No lower extremity edema Normal pedal pulses bilaterally Skin normal, nails normal, no calluses Pinprick examination of the feet normal. Neurologic:  alert & oriented X3.  Speech normal, gait appropriate for age and unassisted Psych--  Cognition and judgment appear intact.  Cooperative with normal attention span and concentration.  Behavior appropriate. No anxious or depressed appearing.      Assessment     Assessment DM Hyperlipidemia CAD, stent 2008, negative stress test 2011 Prostate Cancer 01/2018 GERD, Nl EGD 2004 (done for hoarseness) ED HOH  PLAN: RW:ERXV A1c 7.2, currently on metformin and Januvia, concerned about the cost of medications. He is doing well with his diet, feet exam negative. Explained that patient that given his history of CAD his A1c goal is to be as close as 6.5 as possible. Also explained that there are other medications that not only  will help with his blood sugar but also are somehow cardioprotective but at the same time they are expensive. We agreed to check a A1c and discuss further with results.  Also check a BMP Prostate cancer: Finished radiation therapy in December 2019.  Got a shot of Lupron in July, has been feeling weak but overall doing okay.  Urinary flow is very good, PSA currently  undetectable per patient. GERD dyspepsia: GI note reviewed, recommending PPIs, no endoscopy needed. High cholesterol: Well-controlled, continue statins and refill Zetia If so desired, will call for labs to be done ahead of next appointment RTC 4 months

## 2018-10-26 NOTE — Patient Instructions (Signed)
GO TO THE LAB : Get the blood work     GO TO THE FRONT DESK Schedule your next appointment for a checkup in 4 months  We talk about an additional medication for your diabetes for instance: Victoza a GLP-1 receptor agonist

## 2018-10-26 NOTE — Assessment & Plan Note (Signed)
CM:KLKJ A1c 7.2, currently on metformin and Januvia, concerned about the cost of medications. He is doing well with his diet, feet exam negative. Explained that patient that given his history of CAD his A1c goal is to be as close as 6.5 as possible. Also explained that there are other medications that not only will help with his blood sugar but also are somehow cardioprotective but at the same time they are expensive. We agreed to check a A1c and discuss further with results.  Also check a BMP Prostate cancer: Finished radiation therapy in December 2019.  Got a shot of Lupron in July, has been feeling weak but overall doing okay.  Urinary flow is very good, PSA currently  undetectable per patient. GERD dyspepsia: GI note reviewed, recommending PPIs, no endoscopy needed. High cholesterol: Well-controlled, continue statins and refill Zetia If so desired, will call for labs to be done ahead of next appointment RTC 4 months

## 2018-10-31 ENCOUNTER — Encounter: Payer: Self-pay | Admitting: Internal Medicine

## 2018-11-04 ENCOUNTER — Ambulatory Visit: Payer: Self-pay | Admitting: Urology

## 2018-11-10 ENCOUNTER — Encounter: Payer: Self-pay | Admitting: Urology

## 2018-11-10 ENCOUNTER — Ambulatory Visit
Admission: RE | Admit: 2018-11-10 | Discharge: 2018-11-10 | Disposition: A | Discharge status: Home / Self Care | Payer: Medicare Other | Source: Ambulatory Visit | Attending: Radiation Oncology | Admitting: Radiation Oncology

## 2018-11-10 ENCOUNTER — Other Ambulatory Visit: Payer: Self-pay

## 2018-11-10 VITALS — BP 127/81 | HR 76 | Temp 97.8°F | Resp 18 | Ht 68.0 in | Wt 228.1 lb

## 2018-11-10 DIAGNOSIS — Z510 Encounter for antineoplastic radiation therapy: Secondary | ICD-10-CM | POA: Insufficient documentation

## 2018-11-10 DIAGNOSIS — C61 Malignant neoplasm of prostate: Secondary | ICD-10-CM | POA: Diagnosis not present

## 2018-11-10 NOTE — Progress Notes (Signed)
Radiation Oncology         [phone] ________________________________  Name: Harry Baker MRN: [id number]  Date: 11/10/2018  DOB: [birth year]  Post Treatment Note  CC: Colon Branch, MD  Kathie Rhodes, MD  Diagnosis:   73 y.o. male with unfavorable intermediate risk, Stage T1c adenocarcinoma of the prostate with Gleason Score of 4+3, and PSA of 14.65    Interval Since Last Radiation:  6 weeks  08/17/2018 - 09/27/2018:   The prostate was treated to 70 Gy in 28 fractions of 2.5 Gy  Narrative:  The patient returns today for routine follow-up.  He tolerated radiation treatment relatively well.   The patient experienced modest fatigue and some minor urinary irritation with urgency, nocturia x2 with weak stream, and dysuria. He denied hematuria. Towards the end of treatment he noted intermittent diarrhea with urgency and new onset rectal burning with scant blood improved with Prep H, Tucks wipes, sitz baths, and Imodium prn.  He was maintained on ADT throughout radiation treatments and seemed to tolerate this well.  Lupron 45mg  was administered at [facility] 04/27/18.                               On review of systems, the patient states that he is doing very well overall.  He reports a gradual improvement in his LUTS with residual weak stream, intermittency and nocturia x3.  His current IPSS score is 13 indicating moderate symptoms.  He specifically denies dysuria, gross hematuria, excessive daytime frequency, urgency, straining to void, incomplete emptying or incontinence.  He has continued taking Flomax daily which has helped to improve his force of stream.  He reports mild residual loose stools, intermittently.  He denies abdominal pain, nausea, vomiting, diarrhea or constipation.  He had a recent follow-up visit with Dr. Ma Rings on Jhanuary 5, 2020 and PSA at that time was reportedly undetectable.  Overall, he is quite pleased with his progress to date.  ALLERGIES:  is allergic to  naproxen.  Meds: Current Outpatient Medications  Medication Sig Dispense Refill  . aspirin EC 81 MG tablet Take 81 mg by mouth daily.     Marland Kitchen atorvastatin (LIPITOR) 80 MG tablet TAKE 1 TABLET BY MOUTH EVERY DAY 90 tablet 3  . Biotin 2500 MCG CAPS Take 2,500 mcg by mouth daily.     Marland Kitchen co-enzyme Q-10 30 MG capsule Take 30 mg by mouth daily.     Marland Kitchen ezetimibe (ZETIA) 10 MG tablet TAKE 1 TABLET(10 MG) BY MOUTH DAILY 90 tablet 0  . fish oil-omega-3 fatty acids 1000 MG capsule Take 4 g by mouth daily.     Marland Kitchen GLUCOSAMINE-CHONDROITIN PO Take 1 tablet by mouth daily.    . metFORMIN (GLUCOPHAGE) 850 MG tablet Take 1 tablet (850 mg total) by mouth 3 (three) times daily with meals. 270 tablet 1  . Multiple Vitamin (MULTIVITAMIN) capsule Take 1 capsule by mouth daily.      . nitroGLYCERIN (NITROSTAT) 0.4 MG SL tablet Place 0.4 mg under the tongue every 5 (five) minutes as needed for chest pain. Reported on 01/02/2016    . pantoprazole (PROTONIX) 40 MG tablet Take 1 tablet (40 mg total) by mouth daily before breakfast. (Patient taking differently: Take 40 mg by mouth daily as needed. ) 90 tablet 1  . ramipril (ALTACE) 5 MG capsule Take 1 capsule (5 mg total) by mouth daily. 90 capsule 1  . sitaGLIPtin (JANUVIA) 100  MG tablet Take 1 tablet (100 mg total) by mouth daily. 90 tablet 1  . tamsulosin (FLOMAX) 0.4 MG CAPS capsule Take 1 capsule (0.4 mg total) by mouth daily after supper. 30 capsule 5   No current facility-administered medications for this encounter.     Physical Findings:  height is 5\' 8"  (1.727 m) and weight is 228 lb 2 oz (103.5 kg). His oral temperature is 97.8 F (36.6 C). His blood pressure is 127/81 and his pulse is 76. His respiration is 18 and oxygen saturation is 100%.  Pain Assessment Pain Score: 0-No pain/10 In general this is a well appearing Caucasian male in no acute distress.  He's alert and oriented x4 and appropriate throughout the examination. Cardiopulmonary assessment is negative  for acute distress and he exhibits normal effort.   Lab Findings: Lab Results  Component Value Date   WBC 5.2 05/18/2018   HGB 14.4 05/18/2018   HCT 42.8 05/18/2018   MCV 92.4 05/18/2018   PLT 222.0 05/18/2018     Radiographic Findings: No results found.  Impression/Plan: 1. 73 y.o. male with unfavorable intermediate risk, Stage T1c adenocarcinoma of the prostate with Gleason Score of 4+3, and PSA of 14.65.   He will continue to follow up with urology for ongoing PSA determinations and has an appointment scheduled with Dr. Karsten Ro in approximately 4 to 6 months. He understands what to expect with regards to PSA monitoring going forward. I will look forward to following his response to treatment via correspondence with urology, and would be happy to continue to participate in his care if clinically indicated. I talked to the patient about what to expect in the future, including his risk for erectile dysfunction and rectal bleeding. I encouraged him to call or return to the office if he has any questions regarding his previous radiation or possible radiation side effects. He was comfortable with this plan and will follow up as needed.    Nicholos Johns, PA-C

## 2018-11-17 ENCOUNTER — Telehealth: Payer: Self-pay | Admitting: Cardiology

## 2018-11-17 ENCOUNTER — Encounter: Payer: Self-pay | Admitting: Internal Medicine

## 2018-11-17 DIAGNOSIS — I2510 Atherosclerotic heart disease of native coronary artery without angina pectoris: Secondary | ICD-10-CM

## 2018-11-17 MED ORDER — SITAGLIPTIN PHOSPHATE 100 MG PO TABS: 100.0000 mg | ORAL_TABLET | Freq: Every day | ORAL | 1 refills | Status: DC

## 2018-11-17 MED ORDER — PANTOPRAZOLE SODIUM 40 MG PO TBEC: 40.0000 mg | DELAYED_RELEASE_TABLET | Freq: Every day | ORAL | 1 refills | Status: DC

## 2018-11-17 MED ORDER — METFORMIN HCL 850 MG PO TABS: 850.0000 mg | ORAL_TABLET | Freq: Three times a day (TID) | ORAL | 1 refills | Status: DC

## 2018-11-17 MED ORDER — RAMIPRIL 5 MG PO CAPS: 5.0000 mg | ORAL_CAPSULE | Freq: Every day | ORAL | 1 refills | Status: DC

## 2018-11-17 NOTE — Telephone Encounter (Signed)
°*  STAT* If patient is at the pharmacy, call can be transferred to refill team.   1. Which medications need to be refilled? (please list name of each medication and dose if known) Atorvastatin 80 mg Zepia 10 mg  2. Which pharmacy/location (including street and city if local pharmacy) is medication to be sent to? patient would like to have hand written perscrpition and he would like to pick it up on Friday or Monday. Please call patient if need to. Patient see [facility] in [place]   3. Do they need a 30 day or 90 day supply? 90 day

## 2018-11-17 NOTE — Telephone Encounter (Signed)
Spoke with pt, aware will not be able to get signed until Monday. The patient would like thiose scripts mailed to him. Address confirmed.

## 2018-11-17 NOTE — Telephone Encounter (Signed)
Routed to [place], Therapist, sports.

## 2018-11-18 MED ORDER — ATORVASTATIN CALCIUM 80 MG PO TABS: 80.0000 mg | ORAL_TABLET | Freq: Every day | ORAL | 3 refills | Status: DC

## 2018-11-18 MED ORDER — EZETIMIBE 10 MG PO TABS: ORAL_TABLET | ORAL | 3 refills | Status: DC

## 2019-02-06 ENCOUNTER — Encounter: Payer: Self-pay | Admitting: Internal Medicine

## 2019-02-08 ENCOUNTER — Telehealth: Payer: Self-pay | Admitting: *Deleted

## 2019-02-08 NOTE — Telephone Encounter (Signed)
02/08/19 LMOM @ 0945 am, re: follow up appointment.

## 2019-02-15 ENCOUNTER — Telehealth: Payer: Self-pay

## 2019-02-15 DIAGNOSIS — C61 Malignant neoplasm of prostate: Secondary | ICD-10-CM

## 2019-02-15 DIAGNOSIS — E1159 Type 2 diabetes mellitus with other circulatory complications: Secondary | ICD-10-CM

## 2019-02-15 DIAGNOSIS — E7800 Pure hypercholesterolemia, unspecified: Secondary | ICD-10-CM

## 2019-02-15 NOTE — Telephone Encounter (Signed)
Proceed with fasting labs:  A1c: DM FLP: High cholesterol B12: Metformin use, high risk medication use PSA: History of prostate cancer CMP: Diabetes

## 2019-02-15 NOTE — Telephone Encounter (Signed)
Pt has appt next Friday 02/24/2019- he is requesting lab work before virtual visit next week. Requesting A1C, lipid panel, PSA). Please advise.

## 2019-02-17 ENCOUNTER — Encounter: Payer: Self-pay | Admitting: Internal Medicine

## 2019-02-17 NOTE — Telephone Encounter (Signed)
Order placed. Can you call to set up lab appt prior to his virtual visit on 02/24/2019? Thank you.

## 2019-02-20 ENCOUNTER — Other Ambulatory Visit (INDEPENDENT_AMBULATORY_CARE_PROVIDER_SITE_OTHER): Payer: Medicare Other

## 2019-02-20 ENCOUNTER — Other Ambulatory Visit: Payer: Self-pay

## 2019-02-20 DIAGNOSIS — C61 Malignant neoplasm of prostate: Secondary | ICD-10-CM

## 2019-02-20 DIAGNOSIS — E1159 Type 2 diabetes mellitus with other circulatory complications: Secondary | ICD-10-CM | POA: Diagnosis not present

## 2019-02-20 DIAGNOSIS — E7800 Pure hypercholesterolemia, unspecified: Secondary | ICD-10-CM

## 2019-02-20 NOTE — Telephone Encounter (Signed)
Done Pt coming in at 3:30 today

## 2019-02-21 ENCOUNTER — Encounter: Payer: Self-pay | Admitting: Internal Medicine

## 2019-02-21 LAB — COMPREHENSIVE METABOLIC PANEL
ALT: 17 U/L (ref 0–53)
AST: 23 U/L (ref 0–37)
Albumin: 4.3 g/dL (ref 3.5–5.2)
Alkaline Phosphatase: 67 U/L (ref 39–117)
BUN: 19 mg/dL (ref 6–23)
CO2: 28 mEq/L (ref 19–32)
Calcium: 9.5 mg/dL (ref 8.4–10.5)
Chloride: 104 mEq/L (ref 96–112)
Creatinine, Ser: 0.99 mg/dL (ref 0.40–1.50)
GFR: 74.28 mL/min (ref >60.00)
Glucose, Bld: 82 mg/dL (ref 70–99)
Potassium: 4.6 mEq/L (ref 3.5–5.1)
Sodium: 141 mEq/L (ref 135–145)
Total Bilirubin: 0.7 mg/dL (ref 0.2–1.2)
Total Protein: 6.4 g/dL (ref 6.0–8.3)

## 2019-02-21 LAB — LIPID PANEL
Cholesterol: 90 mg/dL (ref 0–200)
HDL: 35.3 mg/dL — ABNORMAL LOW (ref >39.00)
LDL Cholesterol: 34 mg/dL (ref 0–99)
NonHDL: 54.62
Total CHOL/HDL Ratio: 3
Triglycerides: 101 mg/dL (ref 0.0–149.0)
VLDL: 20.2 mg/dL (ref 0.0–40.0)

## 2019-02-21 LAB — PSA: PSA: 0.03 ng/mL — ABNORMAL LOW (ref 0.10–4.00)

## 2019-02-21 LAB — HEMOGLOBIN A1C: Hgb A1c MFr Bld: 6.2 % (ref 4.6–6.5)

## 2019-02-21 LAB — VITAMIN B12: Vitamin B-12: 400 pg/mL (ref 211–911)

## 2019-02-24 ENCOUNTER — Other Ambulatory Visit: Payer: Self-pay

## 2019-02-24 ENCOUNTER — Encounter: Payer: Self-pay | Admitting: Internal Medicine

## 2019-02-24 ENCOUNTER — Telehealth: Payer: Self-pay | Admitting: Internal Medicine

## 2019-02-24 ENCOUNTER — Ambulatory Visit (INDEPENDENT_AMBULATORY_CARE_PROVIDER_SITE_OTHER): Payer: Medicare Other | Admitting: Internal Medicine

## 2019-02-24 DIAGNOSIS — E7800 Pure hypercholesterolemia, unspecified: Secondary | ICD-10-CM | POA: Diagnosis not present

## 2019-02-24 DIAGNOSIS — I2510 Atherosclerotic heart disease of native coronary artery without angina pectoris: Secondary | ICD-10-CM

## 2019-02-24 DIAGNOSIS — E1159 Type 2 diabetes mellitus with other circulatory complications: Secondary | ICD-10-CM | POA: Diagnosis not present

## 2019-02-24 MED ORDER — NITROGLYCERIN 0.4 MG SL SUBL: 0.4000 mg | SUBLINGUAL_TABLET | SUBLINGUAL | 2 refills | Status: DC | PRN

## 2019-02-24 NOTE — Telephone Encounter (Signed)
Called pt left msg to call back and make an appt to Return in about 6 months (around 08/27/2019) for routine visit , 20 minutes, no fasting.

## 2019-02-24 NOTE — Progress Notes (Signed)
Subjective:    Patient ID: Harry Baker, male    DOB: November 24, 1945, 73 y.o.   MRN: [id number]  DOS:  02/24/2019 Type of visit - description: Virtual Visit via Video Note  I connected with@ on 02/26/19 at  8:20 AM EDT by a video enabled telemedicine application and verified that I am speaking with the correct person using two identifiers.   THIS ENCOUNTER IS A VIRTUAL VISIT DUE TO COVID-19 - PATIENT WAS NOT SEEN IN THE OFFICE. PATIENT HAS CONSENTED TO VIRTUAL VISIT / TELEMEDICINE VISIT   Location of patient: home  Location of provider: office  I discussed the limitations of evaluation and management by telemedicine and the availability of in person appointments. The patient expressed understanding and agreed to proceed.  History of Present Illness: Follow-up In general doing well. We reviewed his medications, does not have any fresh nitroglycerin, has never use it and wonders if he really needs it. Recent labs reviewed  Review of Systems No chest pain no difficulty breathing Lifestyle has improved, has lost weight in the last 2 months  Past Medical History:  Diagnosis Date  . CAD (coronary artery disease)    s/p stent 6/08(-) myoview, 8/11 negative stress test  . Diabetes mellitus   . Diverticulosis of colon   . Elevated PSA   . ERECTILE DYSFUNCTION   . GERD   . Hemorrhoid    problems off and on  . Herniated disc    sess chiropractor 2010  . HOH (hard of hearing)   . Hyperlipidemia   . Prostate cancer ([place])   . Prostate cancer ([place])   . Vocal cord polyp     Past Surgical History:  Procedure Laterality Date  . BIOPSY PROSTATE  02/15/2018  . MOUTH SURGERY  2010  . TONSILLECTOMY    . TRANSRECTAL ULTRASOUND  02/15/2018  . VC cyst removal      Social History   Socioeconomic History  . Marital status: Married    Spouse name: Not on file  . Number of children: 1  . Years of education: Not on file  . Highest education level: Not on file  Occupational History   . 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 Financial resource strain: Not on file  . Food insecurity:    Worry: Not on file    Inability: Not on file  . Transportation needs:    Medical: Not on file    Non-medical: Not on file  Tobacco Use  . Smoking status: Light Tobacco Smoker    Types: Cigars  . Smokeless tobacco: Never Used  . Tobacco comment: occ cigar  Substance and Sexual Activity  . Alcohol use: Yes    Comment: wine socially  . Drug use: No  . Sexual activity: Not on file  Lifestyle  . Physical activity:    Days per week: Not on file    Minutes per session: Not on file  . Stress: Not on file  Relationships  . Social connections:    Talks on phone: Not on file    Gets together: Not on file    Attends religious service: Not on file    Active member of club or organization: Not on file    Attends meetings of clubs or organizations: Not on file    Relationship status: Not on file  . Intimate partner violence:    Fear of current or ex partner: Not on file  Emotionally abused: Not on file    Physically abused: Not on file    Forced sexual activity: Not on file  Other Topics Concern  . 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 If you have any questions, ask your nurse or doctor.        aspirin EC 81 MG tablet Take 81 mg by mouth daily.   atorvastatin 80 MG tablet Commonly known as:  LIPITOR Take 1 tablet (80 mg total) by mouth daily.   Biotin 2500 MCG Caps Take 2,500 mcg by mouth daily.   co-enzyme Q-10 30 MG capsule Take 30 mg by mouth daily.   ezetimibe 10 MG tablet Commonly known as:  ZETIA TAKE 1 TABLET(10 MG) BY MOUTH DAILY   fish oil-omega-3 fatty acids 1000 MG capsule Take 4 g  by mouth daily.   GLUCOSAMINE-CHONDROITIN PO Take 1 tablet by mouth daily.   metFORMIN 850 MG tablet Commonly known as:  GLUCOPHAGE Take 1 tablet (850 mg total) by mouth 3 (three) times daily with meals.   multivitamin capsule Take 1 capsule by mouth daily.   nitroGLYCERIN 0.4 MG SL tablet Commonly known as:  NITROSTAT Place 1 tablet (0.4 mg total) under the tongue every 5 (five) minutes x 3 doses as needed for chest pain. What changed:    when to take this  additional instructions Changed by:  Kathlene November, MD   pantoprazole 40 MG tablet Commonly known as:  PROTONIX Take 1 tablet (40 mg total) by mouth daily before breakfast.   ramipril 5 MG capsule Commonly known as:  ALTACE Take 1 capsule (5 mg total) by mouth daily.   sitaGLIPtin 100 MG tablet Commonly known as:  Januvia Take 1 tablet (100 mg total) by mouth daily.   tamsulosin 0.4 MG Caps capsule Commonly known as:  Flomax Take 1 capsule (0.4 mg total) by mouth daily after supper.           Objective:   Physical Exam There were no vitals taken for this visit. This is a video virtual visit, alert oriented x3, no apparent distress. Weight at home: 210 pounds.  Temperature 97.3    Assessment     Assessment DM Hyperlipidemia CAD, stent 2008, negative stress test 2011 Prostate Cancer 01/2018 GERD, Nl EGD 2004 (done for hoarseness) ED HOH  PLAN: DM: Last A1c 6.2, on metformin, Januvia.  He is disappointed that the A1c is not lower given recent weight loss, advised patient that he is doing great,and doing a great job.  Hyperlipidemia: On Lipitor and Zetia, recent FLP excellent. CAD: Asymptomatic, continue present care, RF NTG, patient is not sure if he should carry it with him, I advised him to do so. Prostate cancer, history of: PSA 0.03, fax to urology. RTC 6 months      I discussed the assessment and treatment plan with the patient. The patient was provided an opportunity to ask questions and all were  answered. The patient agreed with the plan and demonstrated an understanding of the instructions.   The patient was advised to call back or seek an in-person evaluation if the symptoms worsen or if the condition  fails to improve as anticipated.

## 2019-02-26 NOTE — Assessment & Plan Note (Signed)
DM: Last A1c 6.2, on metformin, Januvia.  He is disappointed that the A1c is not lower given recent weight loss, advised patient that he is doing great,and doing a great job.  Hyperlipidemia: On Lipitor and Zetia, recent FLP excellent. CAD: Asymptomatic, continue present care, RF NTG, patient is not sure if he should carry it with him, I advised him to do so. Prostate cancer, history of: PSA 0.03, fax to urology. RTC 6 months

## 2019-04-07 ENCOUNTER — Other Ambulatory Visit: Payer: Self-pay | Admitting: Internal Medicine

## 2019-04-18 NOTE — Progress Notes (Signed)
Virtual Visit via Video Note   This visit type was conducted due to national recommendations for restrictions regarding the COVID-19 Pandemic (e.g. social distancing) in an effort to limit this patient's exposure and mitigate transmission in our community.  Due to his co-morbid illnesses, this patient is at least at moderate risk for complications without adequate follow up.  This format is felt to be most appropriate for this patient at this time.  All issues noted in this document were discussed and addressed.  A limited physical exam was performed with this format.  Please refer to the patient's chart for his consent to telehealth for [facility].   Date:  04/20/2019   ID:  Harry Baker, DOB 1946-06-12, MRN [id number]  Patient Location:Home Provider Location: Home  PCP:  Colon Branch, MD  Cardiologist:  Dr Stanford Breed  Evaluation Performed:  Follow-Up Visit  Chief Complaint:  FU CAD  History of Present Illness:    FU coronary artery disease with prior PCI of his right coronary artery in August 2000. He also had residual LAD disease at that time. He has also had previous carotid Dopplers performed on September 24, 2005, which showed normal carotid arteries. His most recent Myoview in August of 2014 showed no ischemia or infarction and ejection fraction of 62%.  Abdominal ultrasound August 2019 showed no aneurysm.  Since I last saw him,the patient has dyspnea with more extreme activities but not with routine activities. It is relieved with rest. It is not associated with chest pain. There is no orthopnea, PND or pedal edema. There is no syncope or palpitations. There is no exertional chest pain.  The patient does not have symptoms concerning for COVID-19 infection (fever, chills, cough, or new shortness of breath).    Past Medical History:  Diagnosis Date  . CAD (coronary artery disease)    s/p stent 6/08(-) myoview, 8/11 negative stress test  . Diabetes mellitus   . Diverticulosis  of colon   . Elevated PSA   . ERECTILE DYSFUNCTION   . GERD   . Hemorrhoid    problems off and on  . Herniated disc    sess chiropractor 2010  . HOH (hard of hearing)   . Hyperlipidemia   . Prostate cancer ([place])   . Prostate cancer ([place])   . Vocal cord polyp    Past Surgical History:  Procedure Laterality Date  . BIOPSY PROSTATE  02/15/2018  . MOUTH SURGERY  2010  . TONSILLECTOMY    . TRANSRECTAL ULTRASOUND  02/15/2018  . VC cyst removal       No outpatient medications have been marked as taking for the 04/20/19 encounter (Telemedicine) with Lelon Perla, MD.     Allergies:   Naproxen   Social History   Tobacco Use  . Smoking status: Light Tobacco Smoker    Types: Cigars  . Smokeless tobacco: Never Used  . Tobacco comment: occ cigar  Substance Use Topics  . Alcohol use: Yes    Comment: wine socially  . Drug use: No     Family Hx: The patient's family history includes Colon cancer in an other family member; Diabetes in his sister; Heart attack in an other family member; Heart failure in his mother; Lung cancer in his brother; Prostate cancer in an other family member; Stroke in his father.  ROS:   Please see the history of present illness.    No Fever, chills  or productive cough All other systems reviewed and are  negative.  Recent Labs: 05/18/2018: Hemoglobin 14.4; Platelets 222.0; TSH 2.96 02/20/2019: ALT 17; BUN 19; Creatinine, Ser 0.99; Potassium 4.6; Sodium 141   Recent Lipid Panel Lab Results  Component Value Date/Time   CHOL 90 02/20/2019 03:34 PM   TRIG 101.0 02/20/2019 03:34 PM   TRIG 74 08/26/2006 10:43 AM   HDL 35.30 (L) 02/20/2019 03:34 PM   CHOLHDL 3 02/20/2019 03:34 PM   LDLCALC 34 02/20/2019 03:34 PM    Wt Readings from Last 3 Encounters:  04/20/19 199 lb (90.3 kg)  11/10/18 228 lb 2 oz (103.5 kg)  10/26/18 226 lb (102.5 kg)     Objective:    Vital Signs:  BP 127/78   Pulse 70   Wt 199 lb (90.3 kg)   BMI 30.26 kg/m    VITAL  SIGNS:  reviewed NAD Answers questions appropriately Normal affect Remainder of physical examination not performed (telehealth visit; coronavirus pandemic)  ASSESSMENT & PLAN:    1. Coronary artery disease-patient denies chest pain.  Continue medical therapy including aspirin and statin. 2. Hyperlipidemia-continue statin and Zetia.  COVID-19 Education: The importance of social distancing was discussed today.  Time:   Today, I have spent 16 minutes with the patient with telehealth technology discussing the above problems.     Medication Adjustments/Labs and Tests Ordered: Current medicines are reviewed at length with the patient today.  Concerns regarding medicines are outlined above.   Tests Ordered: No orders of the defined types were placed in this encounter.   Medication Changes: No orders of the defined types were placed in this encounter.   Follow Up:  Virtual Visit or In Person in 6 month(s)  Signed, Kirk Ruths, MD  04/20/2019 11:11 AM    [place]

## 2019-04-19 ENCOUNTER — Telehealth: Payer: Self-pay | Admitting: Cardiology

## 2019-04-19 NOTE — Telephone Encounter (Signed)
° ° °1. Confirm consent - "In the setting of the current Covid19 crisis, you are scheduled for a (phone or video) visit with your provider on (date) at (time).  Just as we do with many in-office visits, in order for you to participate in this visit, we must obtain consent.  If you'd like, I can send this to your mychart (if signed up) or email for you to review.  Otherwise, I can obtain your verbal consent now.  All virtual visits are billed to your insurance company just like a normal visit would be.  By agreeing to a virtual visit, we'd like you to understand that the technology does not allow for your provider to perform an examination, and thus may limit your provider's ability to fully assess your condition. If your provider identifies any concerns that need to be evaluated in person, we will make arrangements to do so.  Finally, though the technology is pretty good, we cannot assure that it will always work on either your or our end, and in the setting of a video visit, we may have to convert it to a phone-only visit.  In either situation, we cannot ensure that we have a secure connection.  Are you willing to proceed?" STAFF: Did the patient verbally acknowledge consent to telehealth visit? Document YES/NO here:  Yes ° ° ° °FULL LENGTH CONSENT FOR TELE-HEALTH VISIT  ° °I hereby voluntarily request, consent and authorize [facility] and its employed or contracted physicians, physician assistants, nurse practitioners or other licensed health care professionals (the Practitioner), to provide me with telemedicine health care services (the “Services") as deemed necessary by the treating Practitioner. I acknowledge and consent to receive the Services by the Practitioner via telemedicine. I understand that the telemedicine visit will involve communicating with the Practitioner through live audiovisual communication technology and the disclosure of certain medical information by electronic transmission. I  acknowledge that I have been given the opportunity to request an in-person assessment or other available alternative prior to the telemedicine visit and am voluntarily participating in the telemedicine visit. ° °I understand that I have the right to withhold or withdraw my consent to the use of telemedicine in the course of my care at any time, without affecting my right to future care or treatment, and that the Practitioner or I may terminate the telemedicine visit at any time. I understand that I have the right to inspect all information obtained and/or recorded in the course of the telemedicine visit and may receive copies of available information for a reasonable fee.  I understand that some of the potential risks of receiving the Services via telemedicine include:  °• Delay or interruption in medical evaluation due to technological equipment failure or disruption; °• Information transmitted may not be sufficient (e.g. poor resolution of images) to allow for appropriate medical decision making by the Practitioner; and/or  °• In rare instances, security protocols could fail, causing a breach of personal health information. ° °Furthermore, I acknowledge that it is my responsibility to provide information about my medical history, conditions and care that is complete and accurate to the best of my ability. I acknowledge that Practitioner's advice, recommendations, and/or decision may be based on factors not within their control, such as incomplete or inaccurate data provided by me or distortions of diagnostic images or specimens that may result from electronic transmissions. I understand that the practice of medicine is not an exact science and that Practitioner makes no warranties or guarantees regarding treatment   outcomes. I acknowledge that I will receive a copy of this consent concurrently upon execution via email to the email address I last provided but may also request a printed copy by calling the office of  [facility].   ° °I understand that my insurance will be billed for this visit.  ° °I have read or had this consent read to me. °• I understand the contents of this consent, which adequately explains the benefits and risks of the Services being provided via telemedicine.  °• I have been provided ample opportunity to ask questions regarding this consent and the Services and have had my questions answered to my satisfaction. °• I give my informed consent for the services to be provided through the use of telemedicine in my medical care ° °By participating in this telemedicine visit I agree to the above. ° °

## 2019-04-20 ENCOUNTER — Encounter: Payer: Self-pay | Admitting: Cardiology

## 2019-04-20 ENCOUNTER — Encounter: Payer: Self-pay | Admitting: *Deleted

## 2019-04-20 ENCOUNTER — Telehealth (INDEPENDENT_AMBULATORY_CARE_PROVIDER_SITE_OTHER): Payer: Medicare Other | Admitting: Cardiology

## 2019-04-20 VITALS — BP 127/78 | HR 70 | Wt 199.0 lb

## 2019-04-20 DIAGNOSIS — I2510 Atherosclerotic heart disease of native coronary artery without angina pectoris: Secondary | ICD-10-CM | POA: Diagnosis not present

## 2019-04-20 DIAGNOSIS — E7800 Pure hypercholesterolemia, unspecified: Secondary | ICD-10-CM

## 2019-04-20 MED ORDER — NITROGLYCERIN 0.4 MG SL SUBL: 0.4000 mg | SUBLINGUAL_TABLET | SUBLINGUAL | 6 refills | Status: DC | PRN

## 2019-04-20 NOTE — Patient Instructions (Signed)
Medication Instructions:  Refill sent to the pharmacy electronically.  If you need a refill on your cardiac medications before your next appointment, please call your pharmacy.   Lab work: If you have labs (blood work) drawn today and your tests are completely normal, you will receive your results only by: Marland Kitchen MyChart Message (if you have MyChart) OR . A paper copy in the mail If you have any lab test that is abnormal or we need to change your treatment, we will call you to review the results.  Follow-Up: At [facility], you and your health needs are our priority.  As part of our continuing mission to provide you with exceptional heart care, we have created designated Provider Care Teams.  These Care Teams include your primary Cardiologist (physician) and Advanced Practice Providers (APPs -  Physician Assistants and Nurse Practitioners) who all work together to provide you with the care you need, when you need it. You will need a follow up appointment in 6 months.  Please call our office 2 months in advance to schedule this appointment.  You may see Kirk Ruths MD or one of the following Advanced Practice Providers on your designated Care Team:   Kerin Ransom, PA-C Roby Lofts, [place] . Sande Rives, PA-C

## 2019-05-03 DIAGNOSIS — C61 Malignant neoplasm of prostate: Secondary | ICD-10-CM | POA: Diagnosis not present

## 2019-05-08 LAB — PSA: PSA: 0.047

## 2019-05-10 DIAGNOSIS — N401 Enlarged prostate with lower urinary tract symptoms: Secondary | ICD-10-CM | POA: Diagnosis not present

## 2019-05-10 DIAGNOSIS — R3912 Poor urinary stream: Secondary | ICD-10-CM | POA: Diagnosis not present

## 2019-05-10 DIAGNOSIS — Z8546 Personal history of malignant neoplasm of prostate: Secondary | ICD-10-CM | POA: Diagnosis not present

## 2019-05-22 ENCOUNTER — Encounter: Payer: Self-pay | Admitting: Internal Medicine

## 2019-05-28 ENCOUNTER — Other Ambulatory Visit: Payer: Self-pay | Admitting: Internal Medicine

## 2019-06-27 DIAGNOSIS — L821 Other seborrheic keratosis: Secondary | ICD-10-CM | POA: Diagnosis not present

## 2019-06-27 DIAGNOSIS — L819 Disorder of pigmentation, unspecified: Secondary | ICD-10-CM | POA: Diagnosis not present

## 2019-06-27 DIAGNOSIS — D229 Melanocytic nevi, unspecified: Secondary | ICD-10-CM | POA: Diagnosis not present

## 2019-06-27 DIAGNOSIS — L570 Actinic keratosis: Secondary | ICD-10-CM | POA: Diagnosis not present

## 2019-06-27 DIAGNOSIS — D1801 Hemangioma of skin and subcutaneous tissue: Secondary | ICD-10-CM | POA: Diagnosis not present

## 2019-06-27 DIAGNOSIS — L814 Other melanin hyperpigmentation: Secondary | ICD-10-CM | POA: Diagnosis not present

## 2019-06-27 DIAGNOSIS — L308 Other specified dermatitis: Secondary | ICD-10-CM | POA: Diagnosis not present

## 2019-06-29 DIAGNOSIS — Z23 Encounter for immunization: Secondary | ICD-10-CM | POA: Diagnosis not present

## 2019-07-07 NOTE — Progress Notes (Signed)
Virtual Visit via Video Note  I connected with patient on 07/10/19 at  8:00 AM EDT by audio enabled telemedicine application and verified that I am speaking with the correct person using two identifiers.   THIS ENCOUNTER IS A VIRTUAL VISIT DUE TO COVID-19 - PATIENT WAS NOT SEEN IN THE OFFICE. PATIENT HAS CONSENTED TO VIRTUAL VISIT / TELEMEDICINE VISIT   Location of patient: home  Location of provider: office  I discussed the limitations of evaluation and management by telemedicine and the availability of in person appointments. The patient expressed understanding and agreed to proceed.   Subjective:   Harry Baker is a 73 y.o. male who presents for Medicare Annual/Subsequent preventive examination.  Review of Systems:   Home Safety/Smoke Alarms: Feels safe in home. Smoke alarms in place. Lives with wife in 2 story home. Stays mainly on 1st floor. No trouble navigating stairs.   Male:   CCS-  Last 12/13/09. 10 yr recall   Eye- states appt coming up soon. UTD per pt.  PSA-  Lab Results  Component Value Date   PSA 0.047 05/08/2019   PSA 0.03 (L) 02/20/2019   PSA <0.015 10/20/2018       Objective:    Vitals: Wt 193 lb (87.5 kg)   BMI 29.35 kg/m   Body mass index is 29.35 kg/m.  Advanced Directives 07/10/2019 11/10/2018 07/08/2018  Does Patient Have a Medical Advance Directive? No Yes No  Type of Advance Directive - Living will -  Would patient like information on creating a medical advance directive? No - Patient declined - Yes (MAU/Ambulatory/Procedural Areas - Information given)    Tobacco Social History   Tobacco Use  Smoking Status Light Tobacco Smoker  . Types: Cigars  Smokeless Tobacco Never Used  Tobacco Comment   occ cigar     Ready to quit: Not Answered Counseling given: Not Answered Comment: occ cigar   Clinical Intake: Pain : No/denies pain    Past Medical History:  Diagnosis Date  . CAD (coronary artery disease)    s/p stent 6/08(-)  myoview, 8/11 negative stress test  . Diabetes mellitus   . Diverticulosis of colon   . Elevated PSA   . ERECTILE DYSFUNCTION   . GERD   . Hemorrhoid    problems off and on  . Herniated disc    sess chiropractor 2010  . HOH (hard of hearing)   . Hyperlipidemia   . Prostate cancer ([place])   . Prostate cancer ([place])   . Vocal cord polyp    Past Surgical History:  Procedure Laterality Date  . BIOPSY PROSTATE  02/15/2018  . MOUTH SURGERY  2010  . TONSILLECTOMY    . TRANSRECTAL ULTRASOUND  02/15/2018  . VC cyst removal     Family History  Problem Relation Age of Onset  . Heart failure Mother   . Stroke Father   . Prostate cancer Other        uncle  . Lung cancer Brother   . Colon cancer Other        great aunt  . Heart attack Other        GF  . Diabetes Sister    Social History   Socioeconomic History  . Marital status: Married    Spouse name: Not on file  . Number of children: 1  . Years of education: Not on file  . Highest education level: Not on file  Occupational History  . 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 Financial resource strain: Not on file  . Food insecurity    Worry: Not on file    Inability: Not on file  . Transportation needs    Medical: Not on file    Non-medical: Not on file  Tobacco Use  . Smoking status: Light Tobacco Smoker    Types: Cigars  . Smokeless tobacco: Never Used  . Tobacco comment: occ cigar  Substance and Sexual Activity  . Alcohol use: Yes    Comment: wine socially  . Drug use: No  . Sexual activity: Not on file  Lifestyle  . Physical activity    Days per week: Not on file    Minutes per session: Not on file  . Stress: Not on file  Relationships  . Social Herbalist on phone: Not on file    Gets together: Not on file    Attends religious service: Not on file    Active member of club or organization: Not on file    Attends meetings of clubs or  organizations: Not on file    Relationship status: Not on file  Other Topics Concern  . 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 aspirin EC 81 MG tablet Take 81 mg by mouth daily.   Marland Kitchen atorvastatin (LIPITOR) 80 MG tablet Take 1 tablet (80 mg total) by mouth daily.  . Biotin 2500 MCG CAPS Take 2,500 mcg by mouth daily.   Marland Kitchen co-enzyme Q-10 30 MG capsule Take 30 mg by mouth daily.   Marland Kitchen ezetimibe (ZETIA) 10 MG tablet TAKE 1 TABLET(10 MG) BY MOUTH DAILY  . fish oil-omega-3 fatty acids 1000 MG capsule Take 4 g by mouth daily.   Marland Kitchen GLUCOSAMINE-CHONDROITIN PO Take 1 tablet by mouth daily.  . metFORMIN (GLUCOPHAGE) 850 MG tablet Take 1 tablet (850 mg total) by mouth 3 (three) times daily with meals.  . Multiple Vitamin (MULTIVITAMIN) capsule Take 1 capsule by mouth daily.    . pantoprazole (PROTONIX) 40 MG tablet Take 1 tablet (40 mg total) by mouth daily before breakfast.  . ramipril (ALTACE) 5 MG capsule Take 1 capsule (5 mg total) by mouth daily.  . sitaGLIPtin (JANUVIA) 100 MG tablet Take 1 tablet (100 mg total) by mouth daily.  . nitroGLYCERIN (NITROSTAT) 0.4 MG SL tablet Place 1 tablet (0.4 mg total) under the tongue every 5 (five) minutes x 3 doses as needed for chest pain. (Patient not taking: Reported on 07/10/2019)   No facility-administered encounter medications on file as of 07/10/2019.     Activities of Daily Living In your present state of health, do you have any difficulty performing the following activities: 07/10/2019  Hearing? Y  Comment wears hearing aids.  Vision? N  Difficulty concentrating or making decisions? N  Walking or climbing stairs? N  Dressing or bathing? N  Doing errands, shopping? N  Preparing Food and eating ? N  Using the Toilet? N  In the past six months, have you accidently leaked urine? N  Do you have problems with loss of bowel  control? N  Managing your Medications? N  Managing your Finances? N  Housekeeping or managing your Housekeeping? N  Some recent data might be hidden    Patient Care Team: Colon Branch, MD as PCP - Sherri Sear,  Denice Bors, MD as Consulting Physician (Cardiology) Kathie Rhodes, MD as Consulting Physician (Urology)   Assessment:   This is a routine wellness examination for [place]. Physical assessment deferred to PCP.  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 Tries to avoid bread.  Goals    . HEMOGLOBIN A1C < 7.0    . Weight (lb) < 200 lb (90.7 kg)       Fall Risk Fall Risk  07/10/2019 07/08/2018 04/18/2018 07/07/2017 07/06/2016  Falls in the past year? 0 No No No No     Depression Screen PHQ 2/9 Scores 07/10/2019 07/08/2018 04/18/2018 07/07/2017  PHQ - 2 Score 0 0 0 0    Cognitive Function Ad8 score reviewed for issues:  Issues making decisions:no  Less interest in hobbies / activities:no  Repeats questions, stories (family complaining):no  Trouble using ordinary gadgets (microwave, computer, phone):no  Forgets the month or year: no  Mismanaging finances: no  Remembering appts:no  Daily problems with thinking and/or memory:no Ad8 score is=0         Immunization History  Administered Date(s) Administered  . Hep A / Hep B 05/30/2008, 06/29/2008, 11/29/2008  . Influenza Split 08/16/2012  . Influenza Whole 07/20/2007, 07/08/2010  . Influenza, High Dose Seasonal PF 07/31/2013, 07/22/2015, 07/06/2016, 07/07/2017, 06/25/2018  . Influenza,inj,Quad PF,6+ Mos 07/31/2014  . Pneumococcal Conjugate-13 12/17/2014  . Pneumococcal Polysaccharide-23 07/25/2002,  08/26/2007, 09/01/2012  . Td 07/25/2002  . Tdap 11/23/2011  . Zoster 08/28/2008    Screening Tests Health Maintenance  Topic Date Due  . INFLUENZA VACCINE  07/20/2019 (Originally 05/20/2019)  . OPHTHALMOLOGY EXAM  08/12/2019  . HEMOGLOBIN A1C  08/23/2019  . FOOT EXAM  10/27/2019  . COLONOSCOPY  12/14/2019  . TETANUS/TDAP  11/22/2021  . Hepatitis C Screening  Completed  . PNA vac Low Risk Adult  Completed        Plan:   See you next year!  Continue to eat heart healthy diet (full of fruits, vegetables, whole grains, lean protein, water--limit salt, fat, and sugar intake) and increase physical activity as tolerated.   Continue doing brain stimulating activities (puzzles, reading, adult coloring books, staying active) to keep memory sharp.    I have personally reviewed and noted the following in the patient's chart:   . Medical and social history . Use of alcohol, tobacco or illicit drugs  . Current medications and supplements . Functional ability and status . Nutritional status . Physical activity . Advanced directives . List of other physicians . Hospitalizations, surgeries, and ER visits in previous 12 months . Vitals . Screenings to include cognitive, depression, and falls . Referrals and appointments  In addition, I have reviewed and discussed with patient certain preventive protocols, quality metrics, and best practice recommendations. A written personalized care plan for preventive services as well as general preventive health recommendations were provided to patient.     Naaman Plummer [place], [place]  07/10/2019

## 2019-07-10 ENCOUNTER — Other Ambulatory Visit: Payer: Self-pay

## 2019-07-10 ENCOUNTER — Ambulatory Visit (INDEPENDENT_AMBULATORY_CARE_PROVIDER_SITE_OTHER): Payer: Medicare Other | Admitting: *Deleted

## 2019-07-10 ENCOUNTER — Encounter: Payer: Self-pay | Admitting: *Deleted

## 2019-07-10 VITALS — Wt 193.0 lb

## 2019-07-10 DIAGNOSIS — Z0000 Encounter for general adult medical examination without abnormal findings: Secondary | ICD-10-CM

## 2019-07-10 NOTE — Patient Instructions (Signed)
See you next year!  Continue to eat heart healthy diet (full of fruits, vegetables, whole grains, lean protein, water--limit salt, fat, and sugar intake) and increase physical activity as tolerated.   Continue doing brain stimulating activities (puzzles, reading, adult coloring books, staying active) to keep memory sharp.    Harry Baker , Thank you for taking time to come for your Medicare Wellness Visit. I appreciate your ongoing commitment to your health goals. Please review the following plan we discussed and let me know if I can assist you in the future.   These are the goals we discussed: Goals    . HEMOGLOBIN A1C < 7.0    . Weight (lb) < 200 lb (90.7 kg)       This is a list of the screening recommended for you and due dates:  Health Maintenance  Topic Date Due  . Flu Shot  07/20/2019*  . Eye exam for diabetics  08/12/2019  . Hemoglobin A1C  08/23/2019  . Complete foot exam   10/27/2019  . Colon Cancer Screening  12/14/2019  . Tetanus Vaccine  11/22/2021  .  Hepatitis C: One time screening is recommended by Center for Disease Control  (CDC) for  adults born from [birth year] through 1964.   Completed  . Pneumonia vaccines  Completed  *Topic was postponed. The date shown is not the original due date.    Health Maintenance After Age 40 After age 66, you are at a higher risk for certain long-term diseases and infections as well as injuries from falls. Falls are a major cause of broken bones and head injuries in people who are older than age 11. Getting regular preventive care can help to keep you healthy and well. Preventive care includes getting regular testing and making lifestyle changes as recommended by your health care provider. Talk with your health care provider about:  Which screenings and tests you should have. A screening is a test that checks for a disease when you have no symptoms.  A diet and exercise plan that is right for you. What should I know about screenings and  tests to prevent falls? Screening and testing are the best ways to find a health problem early. Early diagnosis and treatment give you the best chance of managing medical conditions that are common after age 10. Certain conditions and lifestyle choices may make you more likely to have a fall. Your health care provider may recommend:  Regular vision checks. Poor vision and conditions such as cataracts can make you more likely to have a fall. If you wear glasses, make sure to get your prescription updated if your vision changes.  Medicine review. Work with your health care provider to regularly review all of the medicines you are taking, including over-the-counter medicines. Ask your health care provider about any side effects that may make you more likely to have a fall. Tell your health care provider if any medicines that you take make you feel dizzy or sleepy.  Osteoporosis screening. Osteoporosis is a condition that causes the bones to get weaker. This can make the bones weak and cause them to break more easily.  Blood pressure screening. Blood pressure changes and medicines to control blood pressure can make you feel dizzy.  Strength and balance checks. Your health care provider may recommend certain tests to check your strength and balance while standing, walking, or changing positions.  Foot health exam. Foot pain and numbness, as well as not wearing proper footwear, can make you  more likely to have a fall.  Depression screening. You may be more likely to have a fall if you have a fear of falling, feel emotionally low, or feel unable to do activities that you used to do.  Alcohol use screening. Using too much alcohol can affect your balance and may make you more likely to have a fall. What actions can I take to lower my risk of falls? General instructions  Talk with your health care provider about your risks for falling. Tell your health care provider if: ? You fall. Be sure to tell your  health care provider about all falls, even ones that seem minor. ? You feel dizzy, sleepy, or off-balance.  Take over-the-counter and prescription medicines only as told by your health care provider. These include any supplements.  Eat a healthy diet and maintain a healthy weight. A healthy diet includes low-fat dairy products, low-fat (lean) meats, and fiber from whole grains, beans, and lots of fruits and vegetables. Home safety  Remove any tripping hazards, such as rugs, cords, and clutter.  Install safety equipment such as grab bars in bathrooms and safety rails on stairs.  Keep rooms and walkways well-lit. Activity   Follow a regular exercise program to stay fit. This will help you maintain your balance. Ask your health care provider what types of exercise are appropriate for you.  If you need a cane or walker, use it as recommended by your health care provider.  Wear supportive shoes that have nonskid soles. Lifestyle  Do not drink alcohol if your health care provider tells you not to drink.  If you drink alcohol, limit how much you have: ? 0-1 drink a day for women. ? 0-2 drinks a day for men.  Be aware of how much alcohol is in your drink. In the [place] one drink equals one typical bottle of beer (12 oz), one-half glass of wine (5 oz), or one shot of hard liquor (1 oz).  Do not use any products that contain nicotine or tobacco, such as cigarettes and e-cigarettes. If you need help quitting, ask your health care provider. Summary  Having a healthy lifestyle and getting preventive care can help to protect your health and wellness after age 37.  Screening and testing are the best way to find a health problem early and help you avoid having a fall. Early diagnosis and treatment give you the best chance for managing medical conditions that are more common for people who are older than age 72.  Falls are a major cause of broken bones and head injuries in people who are older  than age 52. Take precautions to prevent a fall at home.  Work with your health care provider to learn what changes you can make to improve your health and wellness and to prevent falls. This information is not intended to replace advice given to you by your health care provider. Make sure you discuss any questions you have with your health care provider. Document Released: 08/18/2017 Document Revised: 01/26/2019 Document Reviewed: 08/18/2017 Elsevier Patient Education  2020 [employer].

## 2019-08-21 DIAGNOSIS — E119 Type 2 diabetes mellitus without complications: Secondary | ICD-10-CM | POA: Diagnosis not present

## 2019-08-21 DIAGNOSIS — H52203 Unspecified astigmatism, bilateral: Secondary | ICD-10-CM | POA: Diagnosis not present

## 2019-08-21 DIAGNOSIS — H31002 Unspecified chorioretinal scars, left eye: Secondary | ICD-10-CM | POA: Diagnosis not present

## 2019-08-21 DIAGNOSIS — H11153 Pinguecula, bilateral: Secondary | ICD-10-CM | POA: Diagnosis not present

## 2019-08-21 DIAGNOSIS — Z7984 Long term (current) use of oral hypoglycemic drugs: Secondary | ICD-10-CM | POA: Diagnosis not present

## 2019-08-21 DIAGNOSIS — H353131 Nonexudative age-related macular degeneration, bilateral, early dry stage: Secondary | ICD-10-CM | POA: Diagnosis not present

## 2019-08-21 DIAGNOSIS — H5213 Myopia, bilateral: Secondary | ICD-10-CM | POA: Diagnosis not present

## 2019-08-21 DIAGNOSIS — H43821 Vitreomacular adhesion, right eye: Secondary | ICD-10-CM | POA: Diagnosis not present

## 2019-08-21 DIAGNOSIS — H3554 Dystrophies primarily involving the retinal pigment epithelium: Secondary | ICD-10-CM | POA: Diagnosis not present

## 2019-08-21 DIAGNOSIS — H524 Presbyopia: Secondary | ICD-10-CM | POA: Diagnosis not present

## 2019-08-21 DIAGNOSIS — H2513 Age-related nuclear cataract, bilateral: Secondary | ICD-10-CM | POA: Diagnosis not present

## 2019-08-21 DIAGNOSIS — H25013 Cortical age-related cataract, bilateral: Secondary | ICD-10-CM | POA: Diagnosis not present

## 2019-08-21 LAB — HM DIABETES EYE EXAM

## 2019-08-28 ENCOUNTER — Encounter: Payer: Self-pay | Admitting: Internal Medicine

## 2019-08-29 ENCOUNTER — Other Ambulatory Visit: Payer: Self-pay

## 2019-08-30 ENCOUNTER — Encounter: Payer: Self-pay | Admitting: Internal Medicine

## 2019-08-30 ENCOUNTER — Ambulatory Visit (INDEPENDENT_AMBULATORY_CARE_PROVIDER_SITE_OTHER): Payer: Medicare Other | Admitting: Internal Medicine

## 2019-08-30 ENCOUNTER — Other Ambulatory Visit: Payer: Self-pay

## 2019-08-30 VITALS — BP 127/83 | HR 75 | Temp 95.1°F | Resp 16 | Ht 68.0 in | Wt 186.4 lb

## 2019-08-30 DIAGNOSIS — T148XXD Other injury of unspecified body region, subsequent encounter: Secondary | ICD-10-CM | POA: Diagnosis not present

## 2019-08-30 DIAGNOSIS — W57XXXD Bitten or stung by nonvenomous insect and other nonvenomous arthropods, subsequent encounter: Secondary | ICD-10-CM

## 2019-08-30 DIAGNOSIS — I2510 Atherosclerotic heart disease of native coronary artery without angina pectoris: Secondary | ICD-10-CM | POA: Diagnosis not present

## 2019-08-30 DIAGNOSIS — E7800 Pure hypercholesterolemia, unspecified: Secondary | ICD-10-CM | POA: Diagnosis not present

## 2019-08-30 DIAGNOSIS — E1159 Type 2 diabetes mellitus with other circulatory complications: Secondary | ICD-10-CM

## 2019-08-30 LAB — COMPREHENSIVE METABOLIC PANEL
ALT: 15 U/L (ref 0–53)
AST: 22 U/L (ref 0–37)
Albumin: 4.3 g/dL (ref 3.5–5.2)
Alkaline Phosphatase: 60 U/L (ref 39–117)
BUN: 13 mg/dL (ref 6–23)
CO2: 31 mEq/L (ref 19–32)
Calcium: 9.3 mg/dL (ref 8.4–10.5)
Chloride: 103 mEq/L (ref 96–112)
Creatinine, Ser: 0.91 mg/dL (ref 0.40–1.50)
GFR: 81.75 mL/min (ref >60.00)
Glucose, Bld: 110 mg/dL — ABNORMAL HIGH (ref 70–99)
Potassium: 4.3 mEq/L (ref 3.5–5.1)
Sodium: 139 mEq/L (ref 135–145)
Total Bilirubin: 0.7 mg/dL (ref 0.2–1.2)
Total Protein: 6.3 g/dL (ref 6.0–8.3)

## 2019-08-30 LAB — CBC WITH DIFFERENTIAL/PLATELET
Basophils Absolute: 0.1 10*3/uL (ref 0.0–0.1)
Basophils Relative: 2.6 % (ref 0.0–3.0)
Eosinophils Absolute: 0.1 10*3/uL (ref 0.0–0.7)
Eosinophils Relative: 3.8 % (ref 0.0–5.0)
HCT: 40.7 % (ref 39.0–52.0)
Hemoglobin: 13.7 g/dL (ref 13.0–17.0)
Lymphocytes Relative: 24.8 % (ref 12.0–46.0)
Lymphs Abs: 0.9 10*3/uL (ref 0.7–4.0)
MCHC: 33.5 g/dL (ref 30.0–36.0)
MCV: 94.6 fl (ref 78.0–100.0)
Monocytes Absolute: 0.4 10*3/uL (ref 0.1–1.0)
Monocytes Relative: 10 % (ref 3.0–12.0)
Neutro Abs: 2.2 10*3/uL (ref 1.4–7.7)
Neutrophils Relative %: 58.8 % (ref 43.0–77.0)
Platelets: 198 10*3/uL (ref 150.0–400.0)
RBC: 4.31 Mil/uL (ref 4.22–5.81)
RDW: 13.9 % (ref 11.5–15.5)
WBC: 3.7 10*3/uL — ABNORMAL LOW (ref 4.0–10.5)

## 2019-08-30 LAB — LIPID PANEL
Cholesterol: 106 mg/dL (ref 0–200)
HDL: 40.1 mg/dL (ref >39.00)
LDL Cholesterol: 50 mg/dL (ref 0–99)
NonHDL: 65.76
Total CHOL/HDL Ratio: 3
Triglycerides: 81 mg/dL (ref 0.0–149.0)
VLDL: 16.2 mg/dL (ref 0.0–40.0)

## 2019-08-30 LAB — HEMOGLOBIN A1C: Hgb A1c MFr Bld: 5.9 % (ref 4.6–6.5)

## 2019-08-30 NOTE — Progress Notes (Signed)
Subjective:    Patient ID: Harry Baker, male    DOB: 04-09-1946, 73 y.o.   MRN: [id number]  DOS:  08/30/2019 Type of visit - description: Routine visit DM: Has significantly improved his diet, decreased calorie intake. Tick bites: Has frequent tick bites, typically around [facility] and sometimes in [place].  Check for Lyme disease? CAD: Good compliance with meds.  No symptoms Questions about nutritional supplements  Wt Readings from Last 3 Encounters:  08/30/19 186 lb 6 oz (84.5 kg)  07/10/19 193 lb (87.5 kg)  04/20/19 199 lb (90.3 kg)     Review of Systems Denies chest pain, no  difficulty breathing No edema No palpitations No nausea, vomiting, diarrhea  Past Medical History:  Diagnosis Date  . CAD (coronary artery disease)    s/p stent 6/08(-) myoview, 8/11 negative stress test  . Diabetes mellitus   . Diverticulosis of colon   . Elevated PSA   . ERECTILE DYSFUNCTION   . GERD   . Hemorrhoid    problems off and on  . Herniated disc    sess chiropractor 2010  . HOH (hard of hearing)   . Hyperlipidemia   . Prostate cancer ([place])   . Prostate cancer ([place])   . Vocal cord polyp     Past Surgical History:  Procedure Laterality Date  . BIOPSY PROSTATE  02/15/2018  . MOUTH SURGERY  2010  . TONSILLECTOMY    . TRANSRECTAL ULTRASOUND  02/15/2018  . VC cyst removal      Social History   Socioeconomic History  . Marital status: Married    Spouse name: Not on file  . Number of children: 1  . Years of education: Not on file  . Highest education level: Not on file  Occupational History  . 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 Financial resource strain: Not on file  . Food insecurity    Worry: Not on file    Inability: Not on file  . Transportation needs    Medical: Not on file    Non-medical: Not on file  Tobacco Use  . Smoking status: Light Tobacco Smoker    Types: Cigars  . Smokeless  tobacco: Never Used  . Tobacco comment: occ cigar  Substance and Sexual Activity  . Alcohol use: Yes    Comment: wine socially  . Drug use: No  . Sexual activity: Not on file  Lifestyle  . Physical activity    Days per week: Not on file    Minutes per session: Not on file  . Stress: Not on file  Relationships  . Social Herbalist on phone: Not on file    Gets together: Not on file    Attends religious service: Not on file    Active member of club or organization: Not on file    Attends meetings of clubs or organizations: Not on file    Relationship status: Not on file  . Intimate partner violence    Fear of current or ex partner: Not on file    Emotionally abused: Not on file    Physically abused: Not on file    Forced sexual activity: Not on file  Other Topics Concern  . 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 If you have any questions, ask your nurse or doctor.        aspirin EC 81 MG tablet Take 81 mg by mouth daily.   atorvastatin 80 MG tablet Commonly known as: LIPITOR Take 1 tablet (80 mg total) by mouth daily.   Biotin 2500 MCG Caps Take 2,500 mcg by mouth daily.   co-enzyme Q-10 30 MG capsule Take 30 mg by mouth daily.   ezetimibe 10 MG tablet Commonly known as: ZETIA TAKE 1 TABLET(10 MG) BY MOUTH DAILY   fish oil-omega-3 fatty acids 1000 MG capsule Take 4 g by mouth daily.   GLUCOSAMINE-CHONDROITIN PO Take 1 tablet by mouth daily.   metFORMIN 850 MG tablet Commonly known as: GLUCOPHAGE Take 1 tablet (850 mg total) by mouth 3 (three) times daily with meals.   multivitamin capsule Take 1 capsule by mouth daily.   nitroGLYCERIN 0.4 MG SL tablet Commonly known as: NITROSTAT Place 1 tablet (0.4 mg total) under the tongue every 5 (five) minutes x 3 doses as  needed for chest pain.   pantoprazole 40 MG tablet Commonly known as: PROTONIX Take 1 tablet (40 mg total) by mouth daily before breakfast.   ramipril 5 MG capsule Commonly known as: ALTACE Take 1 capsule (5 mg total) by mouth daily.   sitaGLIPtin 100 MG tablet Commonly known as: Januvia Take 1 tablet (100 mg total) by mouth daily.           Objective:   Physical Exam BP 127/83 (BP Location: Left Arm, Patient Position: Sitting, Cuff Size: Small)   Pulse 75   Temp (!) 95.1 F (35.1 C) (Temporal)   Resp 16   Ht 5\' 8"  (1.727 m)   Wt 186 lb 6 oz (84.5 kg)   SpO2 100%   BMI 28.34 kg/m   General:   Well developed, NAD, BMI noted.  HEENT:  Normocephalic . Face symmetric, atraumatic Lungs:  CTA B Normal respiratory effort, no intercostal retractions, no accessory muscle use. Heart: RRR,  no murmur.  no pretibial edema bilaterally  Abdomen:  Not distended, soft, non-tender. No rebound or rigidity.   Skin: Not pale. Not jaundice Neurologic:  alert & oriented X3.  Speech normal, gait appropriate for age and unassisted Psych--  Cognition and judgment appear intact.  Cooperative with normal attention span and concentration.  Behavior appropriate. No anxious or depressed appearing.     Assessment     Assessment DM Hyperlipidemia CAD, stent 2008, negative stress test 2011 Prostate Cancer 01/2018, s/p XRT GERD, Nl EGD 2004 (done for hoarseness) ED HOH  PLAN: Preventive care reviewed DM: Currently on Metformin, Januvia.  Doing very well with diet, has lost a significant amount of weight.  His personal goal is to get to 180 or possibly 175 pounds.  We will check A1c.  We talk about possibly discontinue Januvia if her A1c continues to be below 6.5.  We also talked about other medications such as Wilder Glade that could have beneficial cardiac effect but he is not interested. Hyperlipidemia: On Lipitor, Zetia, check labs. CAD: Asymptomatic Prostate cancer: Sees urology  regularly, doing well according to the patient Tick bites: As described above, never had symptoms.  We extensively discussed the importance of avoidance, watch for classic rash, symptoms.  Lyme testing is not mandatory per current literature and has pros and cons (may lead to extensive treatment with antibiotic  with risk of C. Difficile).  I left it up to him.  Elected not to check it for now. RTC 6 months

## 2019-08-30 NOTE — Patient Instructions (Addendum)
GO TO THE LAB : Get the blood work     GO TO THE FRONT DESK Schedule your next appointment   for a checkup in 6 months  Proceed with your second shot of Shingrix  Zinc supplements: 50 mg daily

## 2019-08-30 NOTE — Progress Notes (Signed)
Pre visit review using our clinic review tool, if applicable. No additional management support is needed unless otherwise documented below in the visit note. 

## 2019-08-31 NOTE — Assessment & Plan Note (Signed)
Preventive care reviewed DM: Currently on Metformin, Januvia.  Doing very well with diet, has lost a significant amount of weight.  His personal goal is to get to 180 or possibly 175 pounds.  We will check A1c.  We talk about possibly discontinue Januvia if her A1c continues to be below 6.5.  We also talked about other medications such as Wilder Glade that could have beneficial cardiac effect but he is not interested. Hyperlipidemia: On Lipitor, Zetia, check labs. CAD: Asymptomatic Prostate cancer: Sees urology regularly, doing well according to the patient Tick bites: As described above, never had symptoms.  We extensively discussed the importance of avoidance, watch for classic rash, symptoms.  Lyme testing is not mandatory per current literature and has pros and cons (may lead to extensive treatment with antibiotic with risk of C. Difficile).  I left it up to him.  Elected not to check it for now. RTC 6 months

## 2019-08-31 NOTE — Assessment & Plan Note (Signed)
--  Td 2013 - Last Pneumovax 2013 - Prevnar 11-2014 -zostavax 11-09 - shingrix #1 06/2019 - flu shot 06/2019 -CCS: colonoscopy 2000 , 11-2009 ----> next 2021 -h/o prostate ca

## 2019-09-08 ENCOUNTER — Other Ambulatory Visit: Payer: Self-pay | Admitting: Internal Medicine

## 2019-09-08 ENCOUNTER — Encounter: Payer: Self-pay | Admitting: Internal Medicine

## 2019-10-07 ENCOUNTER — Other Ambulatory Visit: Payer: Self-pay | Admitting: Internal Medicine

## 2019-10-25 DIAGNOSIS — C61 Malignant neoplasm of prostate: Secondary | ICD-10-CM | POA: Diagnosis not present

## 2019-10-27 LAB — PSA: PSA: 0.075

## 2019-11-05 IMAGING — NM NM BONE WHOLE BODY
2 series · 2 of 2 positions shown · non-contrast
Comparison: None

Correlation: CT pelvis 03/09/2018

CLINICAL DATA: Prostate cancer, PSA 14.65 on 01/12/2018

EXAM:
NUCLEAR MEDICINE WHOLE BODY BONE SCAN
TECHNIQUE: Whole body anterior and posterior images were obtained approximately
3 hours after intravenous injection of radiopharmaceutical.
RADIOPHARMACEUTICALS:  20.9 mCi Wechnetium-BBm MDP IV

[Series 1: whole body · 2.66mm/px · 1 of 1 slices shown (1 of 2)]
[im 1/1]
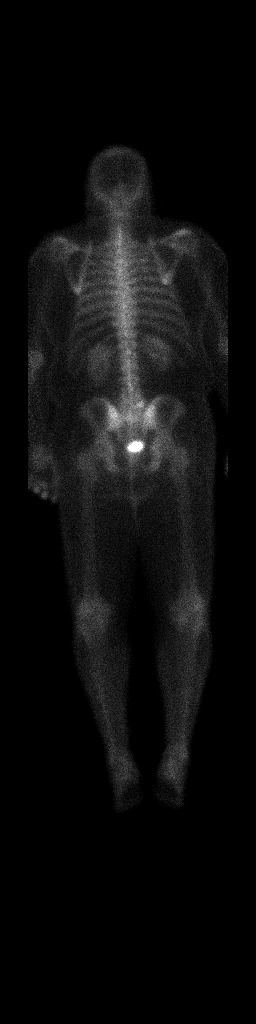

[Series 1: whole body · 2.66mm/px · 1 of 1 slices shown (2 of 2)]
[im 1/1]
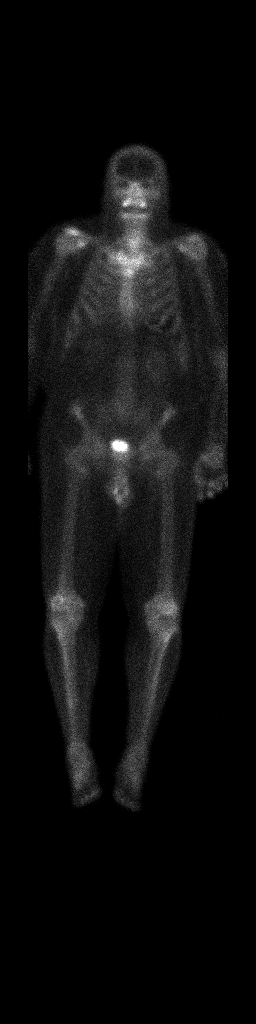

[2 of 2 positions shown; findings below may reference images not displayed]

FINDINGS: Uptake at the RIGHT lateral aspect of the lower lumbar spine at
L4-L5 likely degenerative.

Minimal uptake at the shoulders and knees, typically degenerative.

Uptake at maxilla bilaterally typically of dental origin.

No worrisome sites of abnormal osseous tracer accumulation are
identified to suggest osseous metastatic disease.

Expected urinary tract and soft tissue distribution of tracer.
IMPRESSION: No scintigraphic evidence of osseous metastatic disease.

## 2019-11-08 ENCOUNTER — Ambulatory Visit: Payer: Medicare Other | Admitting: Cardiology

## 2019-11-13 NOTE — Progress Notes (Signed)
HPI: FU coronary artery disease with prior PCI of his right coronary artery in August 2000. He also had residual LAD disease at that time. He has also had previous carotid Dopplers performed on September 24, 2005, which showed normal carotid arteries. His most recent Myoview in August of 2014 showed no ischemia or infarction and ejection fraction of 62%.  Abdominal ultrasound August 2019 showed no aneurysm.  Since I last saw him,the patient has dyspnea with more extreme activities but not with routine activities. It is relieved with rest. It is not associated with chest pain. There is no orthopnea, PND or pedal edema. There is no syncope or palpitations. There is no exertional chest pain.   Current Outpatient Medications  Medication Sig Dispense Refill  . aspirin EC 81 MG tablet Take 81 mg by mouth daily.     Marland Kitchen atorvastatin (LIPITOR) 80 MG tablet Take 1 tablet (80 mg total) by mouth daily. 90 tablet 3  . Biotin 2500 MCG CAPS Take 2,500 mcg by mouth daily.     Marland Kitchen co-enzyme Q-10 30 MG capsule Take 30 mg by mouth daily.     Marland Kitchen ezetimibe (ZETIA) 10 MG tablet TAKE 1 TABLET(10 MG) BY MOUTH DAILY 90 tablet 3  . fish oil-omega-3 fatty acids 1000 MG capsule Take 4 g by mouth daily.     Marland Kitchen GLUCOSAMINE-CHONDROITIN PO Take 1 tablet by mouth daily.    . metFORMIN (GLUCOPHAGE) 850 MG tablet Take 1 tablet (850 mg total) by mouth 3 (three) times daily with meals. 270 tablet 1  . Multiple Vitamin (MULTIVITAMIN) capsule Take 1 capsule by mouth daily.      . nitroGLYCERIN (NITROSTAT) 0.4 MG SL tablet Place 1 tablet (0.4 mg total) under the tongue every 5 (five) minutes x 3 doses as needed for chest pain. 25 tablet 6  . pantoprazole (PROTONIX) 40 MG tablet Take 1 tablet (40 mg total) by mouth daily before breakfast. 90 tablet 1  . ramipril (ALTACE) 5 MG capsule Take 1 capsule (5 mg total) by mouth daily. 90 capsule 1   No current facility-administered medications for this visit.     Past Medical History:    Diagnosis Date  . CAD (coronary artery disease)    s/p stent 6/08(-) myoview, 8/11 negative stress test  . Diabetes mellitus   . Diverticulosis of colon   . Elevated PSA   . ERECTILE DYSFUNCTION   . GERD   . Hemorrhoid    problems off and on  . Herniated disc    sess chiropractor 2010  . HOH (hard of hearing)   . Hyperlipidemia   . Prostate cancer ([place])   . Prostate cancer ([place])   . Vocal cord polyp     Past Surgical History:  Procedure Laterality Date  . BIOPSY PROSTATE  02/15/2018  . MOUTH SURGERY  2010  . TONSILLECTOMY    . TRANSRECTAL ULTRASOUND  02/15/2018  . VC cyst removal      Social History   Socioeconomic History  . Marital status: Married    Spouse name: Not on file  . Number of children: 1  . Years of education: Not on file  . Highest education level: Not on file  Occupational History  . 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 Smoking status: Light Tobacco Smoker    Types: Cigars  . Smokeless tobacco: Never Used  . Tobacco comment: occ cigar  Substance and  Sexual Activity  . Alcohol use: Yes    Comment: wine socially  . Drug use: No  . Sexual activity: Not on file  Other Topics Concern  . 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 Difficulty of Paying Living Expenses: Not on file  Food Insecurity:   . Worried About Charity fundraiser in the Last Year: Not on file  . Ran Out of Food in the Last Year: Not on file  Transportation Needs:   . Lack of Transportation (Medical): Not on file  . Lack of Transportation (Non-Medical): Not on file  Physical Activity:   . Days of Exercise per Week: Not on file  . Minutes of Exercise per Session: Not on file  Stress:   . Feeling of Stress : Not on file  Social Connections:   . Frequency of Communication with Friends and Family: Not on  file  . Frequency of Social Gatherings with Friends and Family: Not on file  . Attends Religious Services: Not on file  . Active Member of Clubs or Organizations: Not on file  . Attends Archivist Meetings: Not on file  . Marital Status: Not on file  Intimate Partner Violence:   . Fear of Current or Ex-Partner: Not on file  . Emotionally Abused: Not on file  . Physically Abused: Not on file  . Sexually Abused: Not on file    Family History  Problem Relation Age of Onset  . Heart failure Mother   . Stroke Father   . Prostate cancer Other        uncle  . Lung cancer Brother   . Colon cancer Other        great aunt  . Heart attack Other        GF  . Diabetes Sister     ROS: no fevers or chills, productive cough, hemoptysis, dysphasia, odynophagia, melena, hematochezia, dysuria, hematuria, rash, seizure activity, orthopnea, PND, pedal edema, claudication. Remaining systems are negative.  Physical Exam: Well-developed well-nourished in no acute distress.  Skin is warm and dry.  HEENT is normal.  Neck is supple.  Chest is clear to auscultation with normal expansion.  Cardiovascular exam is regular rate and rhythm.  Abdominal exam nontender or distended. No masses palpated. Extremities show no edema. neuro grossly intact  ECG-sinus rhythm at a rate of 83, no ST changes.  Personally reviewed  A/P  1 coronary artery disease-patient is not having chest pain.  Continue aspirin and statin.  2 hyperlipidemia-continue Zetia and statin.  Kirk Ruths, MD

## 2019-11-15 ENCOUNTER — Ambulatory Visit (INDEPENDENT_AMBULATORY_CARE_PROVIDER_SITE_OTHER): Payer: Medicare Other | Admitting: Cardiology

## 2019-11-15 ENCOUNTER — Other Ambulatory Visit: Payer: Self-pay

## 2019-11-15 ENCOUNTER — Encounter: Payer: Self-pay | Admitting: Cardiology

## 2019-11-15 VITALS — BP 120/70 | HR 83 | Ht 68.0 in | Wt 190.4 lb

## 2019-11-15 DIAGNOSIS — E7800 Pure hypercholesterolemia, unspecified: Secondary | ICD-10-CM

## 2019-11-15 DIAGNOSIS — Z23 Encounter for immunization: Secondary | ICD-10-CM | POA: Diagnosis not present

## 2019-11-15 DIAGNOSIS — I2510 Atherosclerotic heart disease of native coronary artery without angina pectoris: Secondary | ICD-10-CM | POA: Diagnosis not present

## 2019-11-15 NOTE — Patient Instructions (Signed)

## 2019-11-16 ENCOUNTER — Ambulatory Visit: Payer: Medicare Other

## 2019-11-17 ENCOUNTER — Other Ambulatory Visit: Payer: Self-pay | Admitting: Internal Medicine

## 2019-11-20 ENCOUNTER — Other Ambulatory Visit: Payer: Self-pay

## 2019-11-20 DIAGNOSIS — I2510 Atherosclerotic heart disease of native coronary artery without angina pectoris: Secondary | ICD-10-CM

## 2019-11-20 MED ORDER — EZETIMIBE 10 MG PO TABS: ORAL_TABLET | ORAL | 3 refills | Status: DC

## 2019-11-20 MED ORDER — ATORVASTATIN CALCIUM 80 MG PO TABS: 80.0000 mg | ORAL_TABLET | Freq: Every day | ORAL | 3 refills | Status: DC

## 2019-11-23 ENCOUNTER — Ambulatory Visit: Payer: Medicare Other

## 2019-11-27 ENCOUNTER — Ambulatory Visit: Payer: Medicare Other

## 2019-12-20 DIAGNOSIS — Z23 Encounter for immunization: Secondary | ICD-10-CM | POA: Diagnosis not present

## 2019-12-25 ENCOUNTER — Encounter: Payer: Self-pay | Admitting: Gastroenterology

## 2019-12-28 DIAGNOSIS — H43393 Other vitreous opacities, bilateral: Secondary | ICD-10-CM | POA: Diagnosis not present

## 2019-12-28 DIAGNOSIS — H25013 Cortical age-related cataract, bilateral: Secondary | ICD-10-CM | POA: Diagnosis not present

## 2019-12-28 DIAGNOSIS — H2513 Age-related nuclear cataract, bilateral: Secondary | ICD-10-CM | POA: Diagnosis not present

## 2020-01-24 ENCOUNTER — Ambulatory Visit (AMBULATORY_SURGERY_CENTER): Payer: Self-pay | Admitting: *Deleted

## 2020-01-24 ENCOUNTER — Other Ambulatory Visit: Payer: Self-pay

## 2020-01-24 VITALS — Temp 96.6°F | Ht 68.0 in | Wt 186.0 lb

## 2020-01-24 DIAGNOSIS — Z1211 Encounter for screening for malignant neoplasm of colon: Secondary | ICD-10-CM

## 2020-01-24 NOTE — Progress Notes (Signed)
2nd covid vaccine 12/20/19  No egg or soy allergy known to patient  No issues with past sedation with any surgeries  or procedures, no intubation problems  No diet pills per patient No home 02 use per patient  No blood thinners per patient  Pt denies issues with constipation  No A fib or A flutter  EMMI video sent to pt's e mail   Due to the COVID-19 pandemic we are asking patients to follow these guidelines. Please only bring one care partner. Please be aware that your care partner may wait in the car in the parking lot or if they feel like they will be too hot to wait in the car, they may wait in the lobby on the 4th floor. All care partners are required to wear a mask the entire time (we do not have any that we can provide them), they need to practice social distancing, and we will do a Covid check for all patient's and care partners when you arrive. Also we will check their temperature and your temperature. If the care partner waits in their car they need to stay in the parking lot the entire time and we will call them on their cell phone when the patient is ready for discharge so they can bring the car to the front of the building. Also all patient's will need to wear a mask into building.

## 2020-01-30 ENCOUNTER — Other Ambulatory Visit: Payer: Self-pay | Admitting: Internal Medicine

## 2020-02-01 DIAGNOSIS — H43812 Vitreous degeneration, left eye: Secondary | ICD-10-CM | POA: Diagnosis not present

## 2020-02-01 DIAGNOSIS — H43393 Other vitreous opacities, bilateral: Secondary | ICD-10-CM | POA: Diagnosis not present

## 2020-02-01 DIAGNOSIS — H11153 Pinguecula, bilateral: Secondary | ICD-10-CM | POA: Diagnosis not present

## 2020-02-07 ENCOUNTER — Other Ambulatory Visit: Payer: Self-pay

## 2020-02-07 ENCOUNTER — Ambulatory Visit (AMBULATORY_SURGERY_CENTER): Payer: Medicare Other | Admitting: Gastroenterology

## 2020-02-07 ENCOUNTER — Encounter: Payer: Self-pay | Admitting: Gastroenterology

## 2020-02-07 VITALS — BP 91/44 | HR 66 | Temp 96.2°F | Resp 15 | Ht 68.0 in | Wt 186.0 lb

## 2020-02-07 DIAGNOSIS — K6289 Other specified diseases of anus and rectum: Secondary | ICD-10-CM

## 2020-02-07 DIAGNOSIS — D127 Benign neoplasm of rectosigmoid junction: Secondary | ICD-10-CM | POA: Diagnosis not present

## 2020-02-07 DIAGNOSIS — Z86010 Personal history of colonic polyps: Secondary | ICD-10-CM | POA: Diagnosis not present

## 2020-02-07 DIAGNOSIS — Z1211 Encounter for screening for malignant neoplasm of colon: Secondary | ICD-10-CM

## 2020-02-07 DIAGNOSIS — D128 Benign neoplasm of rectum: Secondary | ICD-10-CM | POA: Diagnosis not present

## 2020-02-07 DIAGNOSIS — D125 Benign neoplasm of sigmoid colon: Secondary | ICD-10-CM

## 2020-02-07 DIAGNOSIS — K641 Second degree hemorrhoids: Secondary | ICD-10-CM

## 2020-02-07 DIAGNOSIS — D129 Benign neoplasm of anus and anal canal: Secondary | ICD-10-CM

## 2020-02-07 DIAGNOSIS — K5730 Diverticulosis of large intestine without perforation or abscess without bleeding: Secondary | ICD-10-CM

## 2020-02-07 MED ORDER — SODIUM CHLORIDE 0.9 % IV SOLN: 500.0000 mL | Freq: Once | INTRAVENOUS | Status: DC

## 2020-02-07 NOTE — Progress Notes (Signed)
Called to room to assist during endoscopic procedure.  Patient ID and intended procedure confirmed with present staff. Received instructions for my participation in the procedure from the performing physician.  

## 2020-02-07 NOTE — Progress Notes (Signed)
A/ox3, pleased with MAC, report to RN 

## 2020-02-07 NOTE — Patient Instructions (Signed)
Thank you for allowing [place] to care for you today!  Await pathology results, approximately 7-10 days.  Repeat colonoscopy for surveillance based on pathology results.  Recommend fiber in your daily routine, for example Citrucel, [place], Konsyl, or Metamucil.  Depending on the results of the rectal biopsy, further evaluation may be necessary.    YOU HAD AN ENDOSCOPIC PROCEDURE TODAY AT [facility]:   Refer to the procedure report that was given to you for any specific questions about what was found during the examination.  If the procedure report does not answer your questions, please call your gastroenterologist to clarify.  If you requested that your care partner not be given the details of your procedure findings, then the procedure report has been included in a sealed envelope for you to review at your convenience later.  YOU SHOULD EXPECT: Some feelings of bloating in the abdomen. Passage of more gas than usual.  Walking can help get rid of the air that was put into your GI tract during the procedure and reduce the bloating. If you had a lower endoscopy (such as a colonoscopy or flexible sigmoidoscopy) you may notice spotting of blood in your stool or on the toilet paper. If you underwent a bowel prep for your procedure, you may not have a normal bowel movement for a few days.  Please Note:  You might notice some irritation and congestion in your nose or some drainage.  This is from the oxygen used during your procedure.  There is no need for concern and it should clear up in a day or so.  SYMPTOMS TO REPORT IMMEDIATELY:   Following lower endoscopy (colonoscopy or flexible sigmoidoscopy):  Excessive amounts of blood in the stool  Significant tenderness or worsening of abdominal pains  Swelling of the abdomen that is new, acute  Fever of 100F or higher   For urgent or emergent issues, a gastroenterologist can be reached at any hour by calling [phone]. Do not  use MyChart messaging for urgent concerns.    DIET:  We do recommend a small meal at first, but then you may proceed to your regular diet.  Drink plenty of fluids but you should avoid alcoholic beverages for 24 hours.  ACTIVITY:  You should plan to take it easy for the rest of today and you should NOT DRIVE or use heavy machinery until tomorrow (because of the sedation medicines used during the test).    FOLLOW UP: Our staff will call the number listed on your records 48-72 hours following your procedure to check on you and address any questions or concerns that you may have regarding the information given to you following your procedure. If we do not reach you, we will leave a message.  We will attempt to reach you two times.  During this call, we will ask if you have developed any symptoms of COVID 19. If you develop any symptoms (ie: fever, flu-like symptoms, shortness of breath, cough etc.) before then, please call [phone].  If you test positive for Covid 19 in the 2 weeks post procedure, please call and report this information to [place].    If any biopsies were taken you will be contacted by phone or by letter within the next 1-3 weeks.  Please call us at [phone] if you have not heard about the biopsies in 3 weeks.    SIGNATURES/CONFIDENTIALITY: You and/or your care partner have signed paperwork which will be entered into your electronic medical record.  These signatures attest to the fact that that the information above on your After Visit Summary has been reviewed and is understood.  Full responsibility of the confidentiality of this discharge information lies with you and/or your care-partner.

## 2020-02-07 NOTE — Op Note (Signed)
[facility] Patient Name: Harry Baker Procedure Date: 02/07/2020 10:48 AM MRN: [id number] Endoscopist: Gerrit Heck , MD Age: 75 Referring MD:  Date of Birth: [birth year] Gender: Male Account #: [id number] Procedure:                Colonoscopy Indications:              Screening for colorectal malignant neoplasm (last                            colonoscopy was 10 years ago)                           Last colonoscopy was in 2011 and notable for a                            single inflammatory polyp and diverticulosis, with                            recommendation to repeat in 10 years. Medicines:                Monitored Anesthesia Care Procedure:                Pre-Anesthesia Assessment:                           - Prior to the procedure, a History and Physical                            was performed, and patient medications and                            allergies were reviewed. The patient's tolerance of                            previous anesthesia was also reviewed. The risks                            and benefits of the procedure and the sedation                            options and risks were discussed with the patient.                            All questions were answered, and informed consent                            was obtained. Prior Anticoagulants: The patient has                            taken no previous anticoagulant or antiplatelet                            agents. ASA Grade Assessment: II - A patient with  mild systemic disease. After reviewing the risks                            and benefits, the patient was deemed in                            satisfactory condition to undergo the procedure.                           After obtaining informed consent, the colonoscope                            was passed under direct vision. Throughout the                            procedure, the patient's blood pressure, pulse,  and                            oxygen saturations were monitored continuously. The                            Colonoscope was introduced through the anus and                            advanced to the the terminal ileum. The colonoscopy                            was performed without difficulty. The patient                            tolerated the procedure well. The quality of the                            bowel preparation was adequate. The terminal ileum,                            ileocecal valve, appendiceal orifice, and rectum                            were photographed. Scope In: 11:02:45 AM Scope Out: 11:32:27 AM Scope Withdrawal Time: 0 hours 23 minutes 48 seconds  Total Procedure Duration: 0 hours 29 minutes 42 seconds  Findings:                 The perianal and digital rectal examinations were                            normal.                           Two sessile polyps were found in the sigmoid colon.                            The polyps were 2 to 4 mm in size. These polyps  were removed with a cold snare. Resection and                            retrieval were complete. Estimated blood loss was                            minimal.                           A 2 mm polyp was found in the rectum. The polyp was                            sessile. The polyp was removed with a cold biopsy                            forceps. Resection and retrieval were complete and                            placed in specimen container #1 as well. Estimated                            blood loss was minimal.                           A few small and large-mouthed diverticula were                            found in the sigmoid colon.                           One 5 mm submucosal nodule was found in the distal                            rectum. The nodule was firm and mobile. Negative                            pillow sign with a closed forceps. Several mucosal                             and tunelled biopsies were taken with a cold                            forceps for histology. Estimated blood loss was                            minimal.                           Non-bleeding internal hemorrhoids were found during                            retroflexion. The hemorrhoids were medium-sized.  The terminal ileum appeared normal. Complications:            No immediate complications. Estimated Blood Loss:     Estimated blood loss was minimal. Impression:               - Two 2 to 4 mm polyps in the sigmoid colon,                            removed with a cold snare. Resected and retrieved.                           - One 2 mm polyp in the rectum, removed with a cold                            biopsy forceps. Resected and retrieved.                           - Diverticulosis in the sigmoid colon.                           - Submucosal nodule in the distal rectum. Biopsied.                           - Non-bleeding internal hemorrhoids.                           - The examined portion of the ileum was normal. Recommendation:           - Patient has a contact number available for                            emergencies. The signs and symptoms of potential                            delayed complications were discussed with the                            patient. Return to normal activities tomorrow.                            Written discharge instructions were provided to the                            patient.                           - Resume previous diet.                           - Continue present medications.                           - Await pathology results.                           - Repeat colonoscopy for surveillance based on  pathology results.                           - Return to GI clinic PRN.                           - Use fiber, for example Citrucel, Fibercon, Konsyl                             or Metamucil.                           - Depending on the results from the biopsies taken                            from the rectal nodule, may require further                            evaluation with rectal EUS. Gerrit Heck, MD 02/07/2020 11:39:53 AM

## 2020-02-07 NOTE — Progress Notes (Signed)
Temp  LC  VS  DT  Pt's states no medical or surgical changes since previsit or office visit.    

## 2020-02-09 ENCOUNTER — Telehealth: Payer: Self-pay

## 2020-02-09 NOTE — Telephone Encounter (Signed)
Left message on follow up call. 

## 2020-02-09 NOTE — Telephone Encounter (Signed)
  Follow up Call-  Call back number 02/07/2020  Post procedure Call Back phone  # [phone]  Permission to leave phone message Yes  Some recent data might be hidden     Patient questions:  Do you have a fever, pain , or abdominal swelling? No. Pain Score  0 *  Have you tolerated food without any problems? Yes.    Have you been able to return to your normal activities? Yes.    Do you have any questions about your discharge instructions: Diet   No. Medications  No. Follow up visit  No.  Do you have questions or concerns about your Care? No.  Actions: * If pain score is 4 or above: No action needed, pain <4.  1. Have you developed a fever since your procedure? no  2.   Have you had an respiratory symptoms (SOB or cough) since your procedure? no  3.   Have you tested positive for COVID 19 since your procedure no  4.   Have you had any family members/close contacts diagnosed with the COVID 19 since your procedure?  no   If yes to any of these questions please route to Joylene John, RN and Erenest Rasher, RN

## 2020-02-18 ENCOUNTER — Encounter: Payer: Self-pay | Admitting: Gastroenterology

## 2020-02-28 ENCOUNTER — Encounter: Payer: Medicare Other | Admitting: Internal Medicine

## 2020-02-29 ENCOUNTER — Other Ambulatory Visit: Payer: Self-pay

## 2020-02-29 ENCOUNTER — Ambulatory Visit (INDEPENDENT_AMBULATORY_CARE_PROVIDER_SITE_OTHER): Payer: Medicare Other | Admitting: Internal Medicine

## 2020-02-29 ENCOUNTER — Encounter: Payer: Self-pay | Admitting: Internal Medicine

## 2020-02-29 VITALS — BP 111/63 | HR 65 | Temp 95.9°F | Resp 18 | Ht 68.0 in | Wt 179.4 lb

## 2020-02-29 DIAGNOSIS — I2510 Atherosclerotic heart disease of native coronary artery without angina pectoris: Secondary | ICD-10-CM | POA: Diagnosis not present

## 2020-02-29 DIAGNOSIS — E1159 Type 2 diabetes mellitus with other circulatory complications: Secondary | ICD-10-CM | POA: Diagnosis not present

## 2020-02-29 LAB — BASIC METABOLIC PANEL
BUN: 14 mg/dL (ref 6–23)
CO2: 31 mEq/L (ref 19–32)
Calcium: 8.9 mg/dL (ref 8.4–10.5)
Chloride: 103 mEq/L (ref 96–112)
Creatinine, Ser: 0.86 mg/dL (ref 0.40–1.50)
GFR: 87.14 mL/min (ref >60.00)
Glucose, Bld: 98 mg/dL (ref 70–99)
Potassium: 4.1 mEq/L (ref 3.5–5.1)
Sodium: 139 mEq/L (ref 135–145)

## 2020-02-29 LAB — HEMOGLOBIN A1C: Hgb A1c MFr Bld: 6.1 % (ref 4.6–6.5)

## 2020-02-29 NOTE — Progress Notes (Signed)
Pre visit review using our clinic review tool, if applicable. No additional management support is needed unless otherwise documented below in the visit note. 

## 2020-02-29 NOTE — Progress Notes (Signed)
Subjective:    Patient ID: Harry Baker, male    DOB: 1945/11/06, 74 y.o.   MRN: [id number]  DOS:  02/29/2020 Type of visit - description: ROV  Since the last office visit is doing well Had problems with his vision. No recent ambulatory BPs or CBGs  Wt Readings from Last 3 Encounters:  02/29/20 179 lb 6 oz (81.4 kg)  02/07/20 186 lb (84.4 kg)  01/24/20 186 lb (84.4 kg)     Review of Systems See above   Past Medical History:  Diagnosis Date  . Arthritis   . CAD (coronary artery disease)    s/p stent 6/08(-) myoview, 8/11 negative stress test  . Diabetes mellitus   . Diverticulosis of colon   . Elevated PSA   . ERECTILE DYSFUNCTION   . GERD   . Hemorrhoid    problems off and on  . Herniated disc    sess chiropractor 2010  . HOH (hard of hearing)   . Hyperlipidemia   . Prostate cancer ([place])   . Prostate cancer ([place])   . Vocal cord polyp     Past Surgical History:  Procedure Laterality Date  . BIOPSY PROSTATE  02/15/2018  . CARDIAC CATHETERIZATION  05/1999   stent  . HEMORROIDECTOMY    . MOUTH SURGERY  2010  . TONSILLECTOMY    . TRANSRECTAL ULTRASOUND  02/15/2018  . VC cyst removal      Allergies as of 02/29/2020   No Known Allergies     Medication List       Accurate as of Feb 29, 2020  8:28 AM. If you have any questions, ask your nurse or doctor.        aspirin EC 81 MG tablet Take 81 mg by mouth daily.   atorvastatin 80 MG tablet Commonly known as: LIPITOR Take 1 tablet (80 mg total) by mouth daily.   Biotin 2500 MCG Caps Take 2,500 mcg by mouth daily.   co-enzyme Q-10 30 MG capsule Take 30 mg by mouth daily.   ezetimibe 10 MG tablet Commonly known as: ZETIA TAKE 1 TABLET(10 MG) BY MOUTH DAILY   fish oil-omega-3 fatty acids 1000 MG capsule Take 4 g by mouth daily.   GLUCOSAMINE-CHONDROITIN PO Take 1 tablet by mouth daily.   metFORMIN 850 MG tablet Commonly known as: GLUCOPHAGE Take 1 tablet (850 mg total) by mouth 3 (three)  times daily with meals.   multivitamin capsule Take 1 capsule by mouth daily.   nitroGLYCERIN 0.4 MG SL tablet Commonly known as: NITROSTAT Place 1 tablet (0.4 mg total) under the tongue every 5 (five) minutes x 3 doses as needed for chest pain.   pantoprazole 40 MG tablet Commonly known as: PROTONIX Take 1 tablet (40 mg total) by mouth daily before breakfast.   ramipril 5 MG capsule Commonly known as: ALTACE Take 1 capsule (5 mg total) by mouth daily.   VITAMIN D (CHOLECALCIFEROL) PO Take by mouth.   Zinc 50 MG Tabs          Objective:   Physical Exam BP 111/63 (BP Location: Left Arm, Patient Position: Sitting, Cuff Size: Small)   Pulse 65   Temp (!) 95.9 F (35.5 C) (Temporal)   Resp 18   Ht 5\' 8"  (1.727 m)   Wt 179 lb 6 oz (81.4 kg)   SpO2 100%   BMI 27.27 kg/m  General:   Well developed, NAD, BMI noted. HEENT:  Normocephalic . Face symmetric, atraumatic Lungs:  CTA B Normal respiratory effort, no intercostal retractions, no accessory muscle use. Heart: RRR,  no murmur.  Lower extremities: no pretibial edema bilaterally  Diabetic foot exam: No swelling, redness, good pulses, normal pinprick examination Neurologic:  alert & oriented X3.  Speech normal, gait appropriate for age and unassisted Psych--  Cognition and judgment appear intact.  Cooperative with normal attention span and concentration.  Behavior appropriate. No anxious or depressed appearing.      Assessment     Assessment DM Hyperlipidemia CAD, stent 2008, negative stress test 2011 Prostate Cancer 01/2018, s/p XRT GERD, Nl EGD 2004 (done for hoarseness) ED HOH  PLAN: DM: Last A1c very good, Januvia was discontinued, currently on Metformin, check a BMP, A1c.  Feet exam negative CAD: Continue present care, no symptoms. Weight loss: Continue to work hard on his lifestyle, wonders if he could drop to 170 pounds, that is okay, current BMI is 27. Visual disturbances: Started few weeks  ago, large left field floaters, he already talk with ophthalmology, recommend to see them again if problem is ongoing or worsen. Preventive care: S/p Covid vaccinations RTC 6 months      This visit occurred during the SARS-CoV-2 public health emergency.  Safety protocols were in place, including screening questions prior to the visit, additional usage of staff PPE, and extensive cleaning of exam room while observing appropriate contact time as indicated for disinfecting solutions.

## 2020-02-29 NOTE — Patient Instructions (Signed)
   GO TO THE LAB : Get the blood work     GO TO THE FRONT DESK, PLEASE SCHEDULE YOUR APPOINTMENTS Come back for  A check up in 6 months

## 2020-03-01 NOTE — Assessment & Plan Note (Signed)
DM: Last A1c very good, Januvia was discontinued, currently on Metformin, check a BMP, A1c.  Feet exam negative CAD: Continue present care, no symptoms. Weight loss: Continue to work hard on his lifestyle, wonders if he could drop to 170 pounds, that is okay, current BMI is 27. Visual disturbances: Started few weeks ago, large left field floaters, he already talk with ophthalmology, recommend to see them again if problem is ongoing or worsen. Preventive care: S/p Covid vaccinations RTC 6 months

## 2020-04-28 ENCOUNTER — Other Ambulatory Visit: Payer: Self-pay | Admitting: Internal Medicine

## 2020-07-11 ENCOUNTER — Ambulatory Visit: Payer: Self-pay | Admitting: *Deleted

## 2020-07-19 DIAGNOSIS — Z23 Encounter for immunization: Secondary | ICD-10-CM | POA: Diagnosis not present

## 2020-08-16 DIAGNOSIS — Z23 Encounter for immunization: Secondary | ICD-10-CM | POA: Diagnosis not present

## 2020-08-28 DIAGNOSIS — H2513 Age-related nuclear cataract, bilateral: Secondary | ICD-10-CM | POA: Diagnosis not present

## 2020-08-28 DIAGNOSIS — H5213 Myopia, bilateral: Secondary | ICD-10-CM | POA: Diagnosis not present

## 2020-08-28 DIAGNOSIS — H11153 Pinguecula, bilateral: Secondary | ICD-10-CM | POA: Diagnosis not present

## 2020-08-28 DIAGNOSIS — H25013 Cortical age-related cataract, bilateral: Secondary | ICD-10-CM | POA: Diagnosis not present

## 2020-08-28 DIAGNOSIS — Z7984 Long term (current) use of oral hypoglycemic drugs: Secondary | ICD-10-CM | POA: Diagnosis not present

## 2020-08-28 DIAGNOSIS — E119 Type 2 diabetes mellitus without complications: Secondary | ICD-10-CM | POA: Diagnosis not present

## 2020-08-28 DIAGNOSIS — H524 Presbyopia: Secondary | ICD-10-CM | POA: Diagnosis not present

## 2020-08-28 DIAGNOSIS — H353131 Nonexudative age-related macular degeneration, bilateral, early dry stage: Secondary | ICD-10-CM | POA: Diagnosis not present

## 2020-08-28 DIAGNOSIS — H43812 Vitreous degeneration, left eye: Secondary | ICD-10-CM | POA: Diagnosis not present

## 2020-08-28 DIAGNOSIS — H52203 Unspecified astigmatism, bilateral: Secondary | ICD-10-CM | POA: Diagnosis not present

## 2020-08-28 DIAGNOSIS — H35363 Drusen (degenerative) of macula, bilateral: Secondary | ICD-10-CM | POA: Diagnosis not present

## 2020-08-28 LAB — HM DIABETES EYE EXAM

## 2020-08-30 ENCOUNTER — Encounter: Payer: Self-pay | Admitting: Internal Medicine

## 2020-09-03 ENCOUNTER — Encounter: Payer: Self-pay | Admitting: Internal Medicine

## 2020-09-03 ENCOUNTER — Ambulatory Visit (INDEPENDENT_AMBULATORY_CARE_PROVIDER_SITE_OTHER): Payer: Medicare Other | Admitting: Internal Medicine

## 2020-09-03 ENCOUNTER — Other Ambulatory Visit: Payer: Self-pay

## 2020-09-03 VITALS — BP 110/68 | HR 77 | Temp 98.1°F | Ht 68.0 in | Wt 190.0 lb

## 2020-09-03 DIAGNOSIS — I2510 Atherosclerotic heart disease of native coronary artery without angina pectoris: Secondary | ICD-10-CM

## 2020-09-03 DIAGNOSIS — N62 Hypertrophy of breast: Secondary | ICD-10-CM

## 2020-09-03 DIAGNOSIS — E7800 Pure hypercholesterolemia, unspecified: Secondary | ICD-10-CM | POA: Diagnosis not present

## 2020-09-03 DIAGNOSIS — Z8546 Personal history of malignant neoplasm of prostate: Secondary | ICD-10-CM | POA: Diagnosis not present

## 2020-09-03 DIAGNOSIS — E1159 Type 2 diabetes mellitus with other circulatory complications: Secondary | ICD-10-CM

## 2020-09-03 MED ORDER — NITROGLYCERIN 0.4 MG SL SUBL: 0.4000 mg | SUBLINGUAL_TABLET | SUBLINGUAL | 0 refills | Status: DC | PRN

## 2020-09-03 NOTE — Patient Instructions (Signed)
    GO TO THE FRONT DESK, PLEASE SCHEDULE YOUR APPOINTMENTS Come back for blood work fasting at 8 AM.  Make an appointment  Come back for a checkup in 2 months

## 2020-09-03 NOTE — Progress Notes (Signed)
Subjective:    Patient ID: Harry Baker, male    DOB: February 11, 1946, 74 y.o.   MRN: [id number]  DOS:  09/03/2020 Type of visit - description: Follow-up On November 7 he noted the left nipple becomes suddenly painful and hard to touch. The pain was very sharp. Pain gradually decreased over the following 4 days. He is not sure if the tissue under the nipple was enlarged. He saw no discharge or bleeding from the nipples.  Other than that he is doing well.  Review of Systems Denies chest pain or difficulty breathing.   Past Medical History:  Diagnosis Date  . Arthritis   . CAD (coronary artery disease)    s/p stent 6/08(-) myoview, 8/11 negative stress test  . Diabetes mellitus   . Diverticulosis of colon   . Elevated PSA   . ERECTILE DYSFUNCTION   . GERD   . Hemorrhoid    problems off and on  . Herniated disc    sess chiropractor 2010  . HOH (hard of hearing)   . Hyperlipidemia   . Prostate cancer ([place])   . Prostate cancer ([place])   . Vocal cord polyp     Past Surgical History:  Procedure Laterality Date  . BIOPSY PROSTATE  02/15/2018  . CARDIAC CATHETERIZATION  05/1999   stent  . HEMORROIDECTOMY    . MOUTH SURGERY  2010  . TONSILLECTOMY    . TRANSRECTAL ULTRASOUND  02/15/2018  . VC cyst removal      Allergies as of 09/03/2020   No Known Allergies     Medication List       Accurate as of September 03, 2020 11:59 PM. If you have any questions, ask your nurse or doctor.        aspirin EC 81 MG tablet Take 81 mg by mouth daily.   atorvastatin 80 MG tablet Commonly known as: LIPITOR Take 1 tablet (80 mg total) by mouth daily.   Biotin 2500 MCG Caps Take 2,500 mcg by mouth daily.   co-enzyme Q-10 30 MG capsule Take 30 mg by mouth daily.   ezetimibe 10 MG tablet Commonly known as: ZETIA TAKE 1 TABLET(10 MG) BY MOUTH DAILY   fish oil-omega-3 fatty acids 1000 MG capsule Take 4 g by mouth daily.   GLUCOSAMINE-CHONDROITIN PO Take 1 tablet by mouth  daily.   metFORMIN 850 MG tablet Commonly known as: GLUCOPHAGE Take 1 tablet (850 mg total) by mouth 3 (three) times daily with meals.   multivitamin capsule Take 1 capsule by mouth daily.   nitroGLYCERIN 0.4 MG SL tablet Commonly known as: NITROSTAT Place 1 tablet (0.4 mg total) under the tongue every 5 (five) minutes x 3 doses as needed for chest pain.   pantoprazole 40 MG tablet Commonly known as: PROTONIX Take 1 tablet (40 mg total) by mouth daily as needed.   ramipril 5 MG capsule Commonly known as: ALTACE Take 1 capsule (5 mg total) by mouth daily.   VITAMIN D (CHOLECALCIFEROL) PO Take by mouth.   Zinc 50 MG Tabs          Objective:   Physical Exam BP 110/68 (BP Location: Left Arm, Patient Position: Sitting, Cuff Size: Normal)   Pulse 77   Temp 98.1 F (36.7 C) (Oral)   Ht 5\' 8"  (1.727 m)   Wt 190 lb (86.2 kg)   SpO2 98%   BMI 28.89 kg/m  General:   Well developed, NAD, BMI noted. HEENT:  Normocephalic . Face symmetric,  atraumatic Lungs:  CTA B Normal respiratory effort, no intercostal retractions, no accessory muscle use. Breast: Nipples are symmetric, no rash, no discharge noted, not tender. He has a small amount of breast tissue more noticeable on the left.  Not tender. No axillary lymphadenopathies Heart: RRR,  no murmur.  Lower extremities: no pretibial edema bilaterally  Skin: Not pale. Not jaundice Neurologic:  alert & oriented X3.  Speech normal, gait appropriate for age and unassisted Psych--  Cognition and judgment appear intact.  Cooperative with normal attention span and concentration.  Behavior appropriate. No anxious or depressed appearing.      Assessment     Assessment DM Hyperlipidemia CAD, stent 2008, negative stress test 2011 Prostate Cancer 01/2018, s/p XRT GERD, Nl EGD 2004 (done for hoarseness) ED HOH  PLAN: [id number] on Metformin, Altace, doing well with diet.  Checking CMP, A1c. Hyperlipidemia: On Zetia and  atorvastatin, check a FLP CAD: On aspirin, no symptoms, check a CBC Mastalgia, mild left gynecomastia: Sudden onset, started about 10 days ago, now getting better.  On exam he has more breast tissue on the left. Of note this, he had COVID vaccination on the left shoulder few days prior to the onset of symptoms. He has a history of prostate cancer, has not taken testosterone blockers in a while. Plan: FSH, LH, TSH, testosterone, beta-hCG, estradiol, prolactin.  Will come back in the morning fasting. Recommend self breast exam, reassess in 2 months, consider mammogram at the time. (Appreciate Endo input) H/o prostate ca: Refer to urology, previous urologist retired. Preventive care reviewed RTC 2 months.   This visit occurred during the SARS-CoV-2 public health emergency.  Safety protocols were in place, including screening questions prior to the visit, additional usage of staff PPE, and extensive cleaning of exam room while observing appropriate contact time as indicated for disinfecting solutions.

## 2020-09-04 NOTE — Assessment & Plan Note (Signed)
[id number] on Metformin, Altace, doing well with diet.  Checking CMP, A1c. Hyperlipidemia: On Zetia and atorvastatin, check a FLP CAD: On aspirin, no symptoms, check a CBC Mastalgia, mild left gynecomastia: Sudden onset, started about 10 days ago, now getting better.  On exam he has more breast tissue on the left. Of note this, he had COVID vaccination on the left shoulder few days prior to the onset of symptoms. He has a history of prostate cancer, has not taken testosterone blockers in a while. Plan: FSH, LH, TSH, testosterone, beta-hCG, estradiol, prolactin.  Will come back in the morning fasting. Recommend self breast exam, reassess in 2 months, consider mammogram at the time. (Appreciate Endo input) H/o prostate ca: Refer to urology, previous urologist retired. Preventive care reviewed RTC 2 months.

## 2020-09-04 NOTE — Assessment & Plan Note (Signed)
--  Td 2013 -  Pneumovax 2003, 2008, 2013;   Prevnar 11-2014 -zostavax 11-09; s/ p shingrix #1 06/2019 - s/p covid vax x 3 - had a flu shot   -[id number] 2000 , 11-2009 , 01/2020 next per GI -h/o prostate ca

## 2020-09-05 ENCOUNTER — Other Ambulatory Visit (INDEPENDENT_AMBULATORY_CARE_PROVIDER_SITE_OTHER): Payer: Medicare Other

## 2020-09-05 ENCOUNTER — Other Ambulatory Visit: Payer: Self-pay

## 2020-09-05 DIAGNOSIS — N62 Hypertrophy of breast: Secondary | ICD-10-CM | POA: Diagnosis not present

## 2020-09-05 DIAGNOSIS — E7800 Pure hypercholesterolemia, unspecified: Secondary | ICD-10-CM

## 2020-09-05 DIAGNOSIS — I2510 Atherosclerotic heart disease of native coronary artery without angina pectoris: Secondary | ICD-10-CM | POA: Diagnosis not present

## 2020-09-05 DIAGNOSIS — E1159 Type 2 diabetes mellitus with other circulatory complications: Secondary | ICD-10-CM | POA: Diagnosis not present

## 2020-09-05 LAB — COMPREHENSIVE METABOLIC PANEL
ALT: 18 U/L (ref 0–53)
AST: 25 U/L (ref 0–37)
Albumin: 4 g/dL (ref 3.5–5.2)
Alkaline Phosphatase: 54 U/L (ref 39–117)
BUN: 17 mg/dL (ref 6–23)
CO2: 30 mEq/L (ref 19–32)
Calcium: 8.9 mg/dL (ref 8.4–10.5)
Chloride: 104 mEq/L (ref 96–112)
Creatinine, Ser: 0.94 mg/dL (ref 0.40–1.50)
GFR: 80.34 mL/min (ref >60.00)
Glucose, Bld: 106 mg/dL — ABNORMAL HIGH (ref 70–99)
Potassium: 4.3 mEq/L (ref 3.5–5.1)
Sodium: 139 mEq/L (ref 135–145)
Total Bilirubin: 0.6 mg/dL (ref 0.2–1.2)
Total Protein: 6.1 g/dL (ref 6.0–8.3)

## 2020-09-05 LAB — CBC WITH DIFFERENTIAL/PLATELET
Basophils Absolute: 0.1 10*3/uL (ref 0.0–0.1)
Basophils Relative: 2.1 % (ref 0.0–3.0)
Eosinophils Absolute: 0.3 10*3/uL (ref 0.0–0.7)
Eosinophils Relative: 6.1 % — ABNORMAL HIGH (ref 0.0–5.0)
HCT: 39.2 % (ref 39.0–52.0)
Hemoglobin: 13.3 g/dL (ref 13.0–17.0)
Lymphocytes Relative: 31.5 % (ref 12.0–46.0)
Lymphs Abs: 1.3 10*3/uL (ref 0.7–4.0)
MCHC: 33.9 g/dL (ref 30.0–36.0)
MCV: 93.2 fl (ref 78.0–100.0)
Monocytes Absolute: 0.4 10*3/uL (ref 0.1–1.0)
Monocytes Relative: 8.6 % (ref 3.0–12.0)
Neutro Abs: 2.1 10*3/uL (ref 1.4–7.7)
Neutrophils Relative %: 51.7 % (ref 43.0–77.0)
Platelets: 205 10*3/uL (ref 150.0–400.0)
RBC: 4.21 Mil/uL — ABNORMAL LOW (ref 4.22–5.81)
RDW: 13.6 % (ref 11.5–15.5)
WBC: 4.1 10*3/uL (ref 4.0–10.5)

## 2020-09-05 LAB — LIPID PANEL
Cholesterol: 99 mg/dL (ref 0–200)
HDL: 43.7 mg/dL (ref >39.00)
LDL Cholesterol: 41 mg/dL (ref 0–99)
NonHDL: 55.65
Total CHOL/HDL Ratio: 2
Triglycerides: 72 mg/dL (ref 0.0–149.0)
VLDL: 14.4 mg/dL (ref 0.0–40.0)

## 2020-09-05 LAB — FOLLICLE STIMULATING HORMONE: FSH: 35.5 m[IU]/mL — ABNORMAL HIGH (ref 1.4–18.1)

## 2020-09-05 LAB — TSH: TSH: 2.87 u[IU]/mL (ref 0.35–4.50)

## 2020-09-05 LAB — HEMOGLOBIN A1C: Hgb A1c MFr Bld: 6.2 % (ref 4.6–6.5)

## 2020-09-05 LAB — LUTEINIZING HORMONE: LH: 10.59 m[IU]/mL (ref 3.10–34.60)

## 2020-09-05 LAB — HCG, QUANTITATIVE, PREGNANCY: Quantitative HCG: <0.6 m[IU]/mL

## 2020-09-06 ENCOUNTER — Other Ambulatory Visit: Payer: Self-pay | Admitting: Cardiology

## 2020-09-06 DIAGNOSIS — I2510 Atherosclerotic heart disease of native coronary artery without angina pectoris: Secondary | ICD-10-CM

## 2020-09-06 LAB — PROLACTIN: Prolactin: 5.1 ng/mL (ref 2.0–18.0)

## 2020-09-06 LAB — TESTOSTERONE TOTAL,FREE,BIO, MALES
Albumin: 4.2 g/dL (ref 3.6–5.1)
Sex Hormone Binding: 62 nmol/L (ref 22–77)
Testosterone, Bioavailable: 146.3 ng/dL (ref 15.0–150.0)
Testosterone, Free: 76 pg/mL — ABNORMAL HIGH (ref 6.0–73.0)
Testosterone: 892 ng/dL — ABNORMAL HIGH (ref 250–827)

## 2020-09-06 LAB — ESTRADIOL: Estradiol: 23 pg/mL (ref <=39)

## 2020-09-10 ENCOUNTER — Telehealth: Payer: Self-pay | Admitting: Internal Medicine

## 2020-09-10 DIAGNOSIS — N644 Mastodynia: Secondary | ICD-10-CM

## 2020-09-10 DIAGNOSIS — E349 Endocrine disorder, unspecified: Secondary | ICD-10-CM

## 2020-09-10 NOTE — Telephone Encounter (Signed)
Endo referral placed. Mitzo- can you change appt from 10/2020 to a 6 month follow-up please?

## 2020-09-10 NOTE — Telephone Encounter (Signed)
appt was r/s with pt

## 2020-09-10 NOTE — Telephone Encounter (Signed)
Results from last visit: Testosterone is elevated, spoke with patient, he is not taking any supplements.  Arrange Endo referral to see Dr. Kelton Pillar, DX mastalgia, increased testosterone.  Change the appointment with me from 2 months to 71-month follow-up.  All other tests were very good, they were discussed with the patient.

## 2020-09-25 ENCOUNTER — Other Ambulatory Visit: Payer: Self-pay | Admitting: Internal Medicine

## 2020-10-10 DIAGNOSIS — C61 Malignant neoplasm of prostate: Secondary | ICD-10-CM | POA: Diagnosis not present

## 2020-10-22 ENCOUNTER — Encounter: Payer: Self-pay | Admitting: Internal Medicine

## 2020-10-28 ENCOUNTER — Other Ambulatory Visit: Payer: Self-pay

## 2020-10-29 ENCOUNTER — Other Ambulatory Visit: Payer: Self-pay

## 2020-10-29 ENCOUNTER — Encounter: Payer: Self-pay | Admitting: Internal Medicine

## 2020-10-29 ENCOUNTER — Ambulatory Visit (INDEPENDENT_AMBULATORY_CARE_PROVIDER_SITE_OTHER): Payer: Medicare Other | Admitting: Internal Medicine

## 2020-10-29 VITALS — BP 112/72 | HR 72 | Resp 16 | Ht 68.0 in | Wt 198.0 lb

## 2020-10-29 DIAGNOSIS — N62 Hypertrophy of breast: Secondary | ICD-10-CM

## 2020-10-29 NOTE — Progress Notes (Signed)
Name: Harry Baker  MRN/ DOB: [id number], [birth year]    Age/ Sex: 75 y.o., male    PCP: Colon Branch, MD   Reason for Endocrinology Evaluation: Gynecomastia      Date of Initial Endocrinology Evaluation: 10/29/2020     HPI: [name] is a 75 y.o. male with a past medical history of CAD ( S/P stent placement in 2008) T2DM, Dyslipidemia and Prostate Cancer ( Dx4/2019) S/P XRT. The patient presented for initial endocrinology clinic visit on 10/29/2020 for consultative assistance with his low testosterone    Pt has been diagnosed and treated for prostate cancer 01/2018 , he was treated with daily radiation and hormone blockers .    His last PSA in 10/2019 was 0.075 ( In 02/2019 it was 0.03 ng/mL )    Pt presented to his PCP in 08/2020 with left gynecomastia .  His FSH was found to be elevated at 35.5 mIU/mL ( Reference 1.4- 18.1 ). Normal LH at 10.59 mIU/mL , normal prolactin at 5.1 ng/mL as well as elevated testosterone at 892 ng/dL and elevated free testosterone at 76.0 pg/mL  with normal SHBG 62 nmol/L.   Today he denies any more left gynecomastia and tenderness    Denies OTC testosterone boosters Denies marijuana use Denies narcotic use    Has had issues with ED for years  Has spontaneous erections but not as frequent    No increase in hair growth     HISTORY:  Past Medical History:  Past Medical History:  Diagnosis Date  . Arthritis   . CAD (coronary artery disease)    s/p stent 6/08(-) myoview, 8/11 negative stress test  . Diabetes mellitus   . Diverticulosis of colon   . Elevated PSA   . ERECTILE DYSFUNCTION   . GERD   . Hemorrhoid    problems off and on  . Herniated disc    sess chiropractor 2010  . HOH (hard of hearing)   . Hyperlipidemia   . Prostate cancer ([place])   . Prostate cancer ([place])   . Vocal cord polyp    Past Surgical History:  Past Surgical History:  Procedure Laterality Date  . BIOPSY PROSTATE  02/15/2018  . CARDIAC  CATHETERIZATION  05/1999   stent  . HEMORROIDECTOMY    . MOUTH SURGERY  2010  . TONSILLECTOMY    . TRANSRECTAL ULTRASOUND  02/15/2018  . VC cyst removal        Social History:  reports that he has been smoking cigars. He has never used smokeless tobacco. He reports current alcohol use. He reports that he does not use drugs.  Family History: family history includes Colon cancer in an other family member; Diabetes in his sister; Heart attack in an other family member; Heart failure in his mother; Lung cancer in his brother; Prostate cancer in an other family member; Stroke in his father.   HOME MEDICATIONS: Allergies as of 10/29/2020   No Known Allergies     Medication List       Accurate as of October 29, 2020  9:14 AM. If you have any questions, ask your nurse or doctor.        aspirin EC 81 MG tablet Take 81 mg by mouth daily.   atorvastatin 80 MG tablet Commonly known as: LIPITOR TAKE 1 TABLET BY MOUTH EVERY DAY   Biotin 2500 MCG Caps Take 2,500 mcg by mouth daily.   co-enzyme Q-10 30 MG capsule Take 30  mg by mouth daily.   ezetimibe 10 MG tablet Commonly known as: ZETIA TAKE 1 TABLET BY MOUTH EVERY DAY   fish oil-omega-3 fatty acids 1000 MG capsule Take 4 g by mouth daily.   GLUCOSAMINE-CHONDROITIN PO Take 1 tablet by mouth daily.   metFORMIN 850 MG tablet Commonly known as: GLUCOPHAGE Take 1 tablet (850 mg total) by mouth 3 (three) times daily with meals.   multivitamin capsule Take 1 capsule by mouth daily.   nitroGLYCERIN 0.4 MG SL tablet Commonly known as: NITROSTAT Place 1 tablet (0.4 mg total) under the tongue every 5 (five) minutes x 3 doses as needed for chest pain.   pantoprazole 40 MG tablet Commonly known as: PROTONIX Take 1 tablet (40 mg total) by mouth daily as needed.   ramipril 5 MG capsule Commonly known as: ALTACE Take 1 capsule (5 mg total) by mouth daily.   VITAMIN D (CHOLECALCIFEROL) PO Take by mouth.   Vitamin D-3 25 MCG  (1000 UT) Caps Take by mouth.   Zinc 50 MG Tabs   zinc gluconate 50 MG tablet Take 50 mg by mouth daily.         REVIEW OF SYSTEMS: A comprehensive ROS was conducted with the patient and is negative except as per HPI and below:  ROS     OBJECTIVE:  VS: BP 112/72 (BP Location: Left Arm, Patient Position: Sitting, Cuff Size: Normal)   Pulse 72   Resp 16   Ht 5\' 8"  (1.727 m)   Wt 198 lb (89.8 kg)   SpO2 99%   BMI 30.11 kg/m    Wt Readings from Last 3 Encounters:  10/29/20 198 lb (89.8 kg)  09/03/20 190 lb (86.2 kg)  02/29/20 179 lb 6 oz (81.4 kg)     EXAM: General: Pt appears well and is in NAD  Neck: General: Supple without adenopathy. Thyroid: Thyroid size normal.  No goiter or nodules appreciated.   Breast  Glandular tissue at 10 O'clock right breast and left breat at 6 O'clock and bilateral superior quadrant , non tender, no nipple discharge   Lungs: Clear with good BS bilat with no rales, rhonchi, or wheezes  Heart: Auscultation: RRR.  Abdomen: Normoactive bowel sounds, soft, nontender, without masses or organomegaly palpable  Extremities:  BL LE: No pretibial edema normal ROM and strength.  Skin: Hair: Texture and amount normal with gender appropriate distribution Skin Inspection: No rashes Skin Palpation: Skin temperature, texture, and thickness normal to palpation  Genital: Normal external penile and testicular exam, right testicular; volume 15 Ml and     Mental Status: Judgment, insight: Intact Memory: Intact for recent and remote events Mood and affect: No depression, anxiety, or agitation     DATA REVIEWED:   Results for Degroat, Lancelot E "RAY" (MRN [id number]) as of 10/29/2020 08:43  Ref. Range 09/05/2020 07:55 09/05/2020 07:57  LH Latest Ref Range: 3.10 - 34.60 mIU/mL 10.59   FSH Latest Ref Range: 1.4 - 18.1 mIU/ML 35.5 (H)   Prolactin Latest Ref Range: 2.0 - 18.0 ng/mL 5.1   Glucose Latest Ref Range: 70 - 99 mg/dL 106 (H)   Hemoglobin A1C  Latest Ref Range: 4.6 - 6.5 % 6.2   Estradiol Latest Ref Range: < OR = 39 pg/mL 23   Sex Horm Binding Glob, Serum Latest Ref Range: 22 - 77 nmol/L  62  Testosterone Latest Ref Range: 250 - 827 ng/dL  892 (H)  Testosterone, Bioavailable Latest Ref Range: 15.0 - 150.0 ng/dL  146.3  Testosterone Free Latest Ref Range: 6.0 - 73.0 pg/mL  76.0 (H)    ASSESSMENT/PLAN/RECOMMENDATIONS:   1. Gynecomastia :  - Pt noted improvement in gynecomastia symptoms , on exam there's bilateral glandular tissue noted  - In review of his labs the pt had elevated FSH and testosterone . Of note, the pt has a hx of prostate cancer , has not been on hormonal blockers since 2019. S/P radiation therapy.  - I have explained to him that elevated testosterone levels could increase his risk of prostate cancer recurrence, will need to investigate further once these results have been confirmed.  - OTC supplements, narcotic use and illicit drug use has been ruled out  - Will repeat fasting early morning labs    F/U in 3 months   Signed electronically by: Mack Guise, MD  [facility]  [facility] [address] [place], [place] [address] Phone: [phone] FAX: [phone]   CC: Colon Branch, [address] [address] Phone: [phone] Fax: [phone]   Return to Endocrinology clinic as below: Future Appointments  Date Time Provider [place]  11/27/2020  9:20 AM Lelon Perla, MD [facility] None  02/12/2021  8:00 AM Colon Branch, MD [id number]

## 2020-10-29 NOTE — Patient Instructions (Signed)
-   Please set up an early morning lab appointment at 8 AM ( fasting ) nothing to eat or drink before the test, just water.

## 2020-10-31 ENCOUNTER — Other Ambulatory Visit (INDEPENDENT_AMBULATORY_CARE_PROVIDER_SITE_OTHER): Payer: Medicare Other

## 2020-10-31 ENCOUNTER — Other Ambulatory Visit: Payer: Self-pay

## 2020-10-31 DIAGNOSIS — N62 Hypertrophy of breast: Secondary | ICD-10-CM | POA: Diagnosis not present

## 2020-11-01 LAB — FOLLICLE STIMULATING HORMONE: FSH: 33.3 m[IU]/mL — ABNORMAL HIGH (ref 1.5–12.4)

## 2020-11-01 LAB — HCG, SERUM, QUALITATIVE: hCG,Beta Subunit,Qual,Serum: NEGATIVE m[IU]/mL

## 2020-11-01 LAB — LUTEINIZING HORMONE: LH: 11.1 m[IU]/mL — ABNORMAL HIGH (ref 1.7–8.6)

## 2020-11-06 ENCOUNTER — Ambulatory Visit: Payer: Medicare Other | Admitting: Internal Medicine

## 2020-11-06 ENCOUNTER — Other Ambulatory Visit: Payer: Self-pay | Admitting: Internal Medicine

## 2020-11-06 DIAGNOSIS — R7989 Other specified abnormal findings of blood chemistry: Secondary | ICD-10-CM

## 2020-11-14 NOTE — Progress Notes (Signed)
HPI: FU coronary artery disease with prior PCI of his right coronary artery in August 2000. He also had residual LAD disease at that time. He has also had previous carotid Dopplers performed on September 24, 2005, which showed normal carotid arteries. His most recent Myoview in August of 2014 showed no ischemia or infarction and ejection fraction of 62%.Abdominal ultrasound August 2019 showed no aneurysm. Since I last saw him,the patient denies any dyspnea on exertion, orthopnea, PND, pedal edema, palpitations, syncope or chest pain.   Current Outpatient Medications  Medication Sig Dispense Refill  . aspirin EC 81 MG tablet Take 81 mg by mouth daily.     Marland Kitchen atorvastatin (LIPITOR) 80 MG tablet TAKE 1 TABLET BY MOUTH EVERY DAY 90 tablet 3  . Biotin 2500 MCG CAPS Take 2,500 mcg by mouth daily.     . Cholecalciferol (VITAMIN D-3) 25 MCG (1000 UT) CAPS Take by mouth.    . co-enzyme Q-10 30 MG capsule Take 30 mg by mouth daily.    Marland Kitchen ezetimibe (ZETIA) 10 MG tablet TAKE 1 TABLET BY MOUTH EVERY DAY 90 tablet 3  . fish oil-omega-3 fatty acids 1000 MG capsule Take 4 g by mouth daily.    Marland Kitchen GLUCOSAMINE-CHONDROITIN PO Take 1 tablet by mouth daily.    . metFORMIN (GLUCOPHAGE) 850 MG tablet Take 1 tablet (850 mg total) by mouth 3 (three) times daily with meals. 270 tablet 1  . Multiple Vitamin (MULTIVITAMIN) capsule Take 1 capsule by mouth daily.    . nitroGLYCERIN (NITROSTAT) 0.4 MG SL tablet Place 1 tablet (0.4 mg total) under the tongue every 5 (five) minutes x 3 doses as needed for chest pain. 25 tablet 3  . pantoprazole (PROTONIX) 40 MG tablet Take 1 tablet (40 mg total) by mouth daily as needed.    . ramipril (ALTACE) 5 MG capsule Take 1 capsule (5 mg total) by mouth daily. 90 capsule 1  . VITAMIN D, CHOLECALCIFEROL, PO Take by mouth. Vitamin d3    . zinc gluconate 50 MG tablet Take 50 mg by mouth daily.     No current facility-administered medications for this visit.     Past Medical  History:  Diagnosis Date  . Arthritis   . CAD (coronary artery disease)    s/p stent 6/08(-) myoview, 8/11 negative stress test  . Diabetes mellitus   . Diverticulosis of colon   . Elevated PSA   . ERECTILE DYSFUNCTION   . GERD   . Hemorrhoid    problems off and on  . Herniated disc    sess chiropractor 2010  . HOH (hard of hearing)   . Hyperlipidemia   . Prostate cancer ([place])   . Prostate cancer ([place])   . Vocal cord polyp     Past Surgical History:  Procedure Laterality Date  . BIOPSY PROSTATE  02/15/2018  . CARDIAC CATHETERIZATION  05/1999   stent  . HEMORROIDECTOMY    . MOUTH SURGERY  2010  . TONSILLECTOMY    . TRANSRECTAL ULTRASOUND  02/15/2018  . VC cyst removal      Social History   Socioeconomic History  . Marital status: Married    Spouse name: Not on file  . Number of children: 1  . Years of education: Not on file  . Highest education level: Not on file  Occupational History  . 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 Smoking status: Light Tobacco  Smoker    Types: Cigars  . Smokeless tobacco: Never Used  . Tobacco comment: occ cigar  Vaping Use  . Vaping Use: Never used  Substance and Sexual Activity  . Alcohol use: Yes    Comment: wine socially  . Drug use: No  . Sexual activity: Not on file  Other Topics Concern  . 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 Heart failure Mother   . Stroke Father   . Prostate cancer Other        uncle  . Lung cancer Brother   . Colon cancer Other        great aunt  . Heart attack Other         GF  . Diabetes Sister   . Esophageal cancer Neg Hx   . Stomach cancer Neg Hx     ROS: no fevers or chills, productive cough, hemoptysis, dysphasia, odynophagia, melena, hematochezia, dysuria, hematuria, rash, seizure activity, orthopnea, PND, pedal edema, claudication. Remaining systems are negative.  Physical Exam: Well-developed well-nourished in no acute distress.  Skin is warm and dry.  HEENT is normal.  Neck is supple.  Chest is clear to auscultation with normal expansion.  Cardiovascular exam is regular rate and rhythm.  Abdominal exam nontender or distended. No masses palpated. Extremities show no edema. neuro grossly intact  ECG-normal sinus rhythm at a rate of 66, no ST changes.  Personally reviewed  A/P  1 coronary artery disease-patient continues to do well with no symptoms.  Plan to continue medical therapy with aspirin and statin.  2 hyperlipidemia-continue statin and Zetia.  Most recent laboratories from November 2021 personally reviewed and showed total cholesterol 99 with LDL 41.  Liver functions normal.  Kirk Ruths, MD

## 2020-11-16 ENCOUNTER — Other Ambulatory Visit: Payer: Self-pay

## 2020-11-16 ENCOUNTER — Ambulatory Visit (HOSPITAL_BASED_OUTPATIENT_CLINIC_OR_DEPARTMENT_OTHER)
Admission: RE | Admit: 2020-11-16 | Discharge: 2020-11-16 | Disposition: A | Discharge status: Home / Self Care | Payer: Medicare Other | Source: Ambulatory Visit | Attending: Internal Medicine | Admitting: Internal Medicine

## 2020-11-16 ENCOUNTER — Other Ambulatory Visit: Payer: Self-pay | Admitting: Internal Medicine

## 2020-11-16 DIAGNOSIS — N62 Hypertrophy of breast: Secondary | ICD-10-CM | POA: Diagnosis not present

## 2020-11-16 DIAGNOSIS — J3489 Other specified disorders of nose and nasal sinuses: Secondary | ICD-10-CM | POA: Diagnosis not present

## 2020-11-16 DIAGNOSIS — R7989 Other specified abnormal findings of blood chemistry: Secondary | ICD-10-CM | POA: Insufficient documentation

## 2020-11-16 MED ORDER — GADOBUTROL 1 MMOL/ML IV SOLN
7.5000 mL | Freq: Once | INTRAVENOUS | Status: AC | PRN
  Administered 2020-11-16: 7.5 mL via INTRAVENOUS

## 2020-11-17 LAB — TESTOSTERONE FREE MS/DIALYSIS
Free Testosterone, Serum: 41 pg/mL — ABNORMAL LOW
Testosterone, Serum (Total): 680 ng/dL
Testosterone-% Free: 0.6 %

## 2020-11-18 ENCOUNTER — Encounter: Payer: Self-pay | Admitting: Internal Medicine

## 2020-11-18 ENCOUNTER — Telehealth: Payer: Self-pay | Admitting: Internal Medicine

## 2020-11-18 NOTE — Telephone Encounter (Signed)
Attempted to call the pt on 11/18/2020 to discuss normal MRI, left a voice mail to check the portal     MRI normal  Testosterone levels trending down, could be local testicular inflammation and as long as levels are trending down    Will continue to monitor     Escudilla Bonita, MD  [facility]  [facility] [address], [place] [address] Phone: [phone] FAX: [phone]

## 2020-11-27 ENCOUNTER — Ambulatory Visit (INDEPENDENT_AMBULATORY_CARE_PROVIDER_SITE_OTHER): Payer: Medicare Other | Admitting: Cardiology

## 2020-11-27 ENCOUNTER — Other Ambulatory Visit: Payer: Self-pay

## 2020-11-27 ENCOUNTER — Encounter: Payer: Self-pay | Admitting: Cardiology

## 2020-11-27 VITALS — BP 110/74 | HR 66 | Ht 68.0 in | Wt 197.0 lb

## 2020-11-27 DIAGNOSIS — E7800 Pure hypercholesterolemia, unspecified: Secondary | ICD-10-CM | POA: Diagnosis not present

## 2020-11-27 DIAGNOSIS — I2510 Atherosclerotic heart disease of native coronary artery without angina pectoris: Secondary | ICD-10-CM

## 2020-11-27 NOTE — Patient Instructions (Signed)

## 2020-12-30 ENCOUNTER — Encounter: Payer: Self-pay | Admitting: Internal Medicine

## 2021-01-02 ENCOUNTER — Encounter: Payer: Self-pay | Admitting: Internal Medicine

## 2021-02-05 DIAGNOSIS — Z23 Encounter for immunization: Secondary | ICD-10-CM | POA: Diagnosis not present

## 2021-02-12 ENCOUNTER — Ambulatory Visit: Payer: Medicare Other | Admitting: Internal Medicine

## 2021-02-18 ENCOUNTER — Ambulatory Visit: Payer: Medicare Other | Admitting: Internal Medicine

## 2021-03-12 ENCOUNTER — Encounter: Payer: Self-pay | Admitting: Internal Medicine

## 2021-03-12 ENCOUNTER — Ambulatory Visit: Payer: Medicare Other | Admitting: Internal Medicine

## 2021-03-12 ENCOUNTER — Ambulatory Visit (INDEPENDENT_AMBULATORY_CARE_PROVIDER_SITE_OTHER): Payer: Medicare Other | Admitting: Internal Medicine

## 2021-03-12 ENCOUNTER — Other Ambulatory Visit: Payer: Self-pay

## 2021-03-12 VITALS — BP 125/82 | HR 85 | Temp 97.0°F | Ht 68.0 in | Wt 192.2 lb

## 2021-03-12 DIAGNOSIS — E1159 Type 2 diabetes mellitus with other circulatory complications: Secondary | ICD-10-CM | POA: Diagnosis not present

## 2021-03-12 DIAGNOSIS — I2510 Atherosclerotic heart disease of native coronary artery without angina pectoris: Secondary | ICD-10-CM

## 2021-03-12 DIAGNOSIS — R198 Other specified symptoms and signs involving the digestive system and abdomen: Secondary | ICD-10-CM | POA: Diagnosis not present

## 2021-03-12 DIAGNOSIS — E7800 Pure hypercholesterolemia, unspecified: Secondary | ICD-10-CM

## 2021-03-12 NOTE — Patient Instructions (Addendum)
Check the  blood pressure   BP GOAL is between 110/65 and  135/85. If it is consistently higher or lower, let me know   GO TO THE LAB : Get the blood work     [place], [place] back for a check up in 6 months   Please bring a copy of a healthcare power of attorney

## 2021-03-12 NOTE — Progress Notes (Signed)
Subjective:    Patient ID: Harry Baker, male    DOB: June 07, 1946, 75 y.o.   MRN: [id number]  DOS:  03/12/2021 Type of visit - description: Routine follow-up  Since the last office visit saw endocrinology for gynecomastia. He is feeling well and has no major concerns.   Review of Systems Still eating healthy. No weight gain on his own scales. No chest pain or difficulty breathing. No nausea, vomiting, diarrhea. Has noted his abdomen to be slightly asymmetric since he lost weight.  He is somewhat concerned about it  Past Medical History:  Diagnosis Date  . Arthritis   . CAD (coronary artery disease)    s/p stent 6/08(-) myoview, 8/11 negative stress test  . Diabetes mellitus   . Diverticulosis of colon   . Elevated PSA   . ERECTILE DYSFUNCTION   . GERD   . Hemorrhoid    problems off and on  . Herniated disc    sess chiropractor 2010  . HOH (hard of hearing)   . Hyperlipidemia   . Prostate cancer ([place])   . Prostate cancer ([place])   . Vocal cord polyp     Past Surgical History:  Procedure Laterality Date  . BIOPSY PROSTATE  02/15/2018  . CARDIAC CATHETERIZATION  05/1999   stent  . HEMORROIDECTOMY    . MOUTH SURGERY  2010  . TONSILLECTOMY    . TRANSRECTAL ULTRASOUND  02/15/2018  . VC cyst removal      Allergies as of 03/12/2021   No Known Allergies     Medication List       Accurate as of Mar 12, 2021 11:59 PM. If you have any questions, ask your nurse or doctor.        aspirin EC 81 MG tablet Take 81 mg by mouth daily.   atorvastatin 80 MG tablet Commonly known as: LIPITOR TAKE 1 TABLET BY MOUTH EVERY DAY   Biotin 2500 MCG Caps Take 2,500 mcg by mouth daily.   co-enzyme Q-10 30 MG capsule Take 30 mg by mouth daily.   ezetimibe 10 MG tablet Commonly known as: ZETIA TAKE 1 TABLET BY MOUTH EVERY DAY   fish oil-omega-3 fatty acids 1000 MG capsule Take 4 g by mouth daily.   GLUCOSAMINE-CHONDROITIN PO Take 1 tablet by mouth daily.    metFORMIN 850 MG tablet Commonly known as: GLUCOPHAGE Take 1 tablet (850 mg total) by mouth 3 (three) times daily with meals.   multivitamin capsule Take 1 capsule by mouth daily.   nitroGLYCERIN 0.4 MG SL tablet Commonly known as: NITROSTAT Place 1 tablet (0.4 mg total) under the tongue every 5 (five) minutes x 3 doses as needed for chest pain.   pantoprazole 40 MG tablet Commonly known as: PROTONIX Take 1 tablet (40 mg total) by mouth daily as needed.   ramipril 5 MG capsule Commonly known as: ALTACE Take 1 capsule (5 mg total) by mouth daily.   VITAMIN D (CHOLECALCIFEROL) PO Take by mouth. Vitamin d3   Vitamin D-3 25 MCG (1000 UT) Caps Take by mouth.   zinc gluconate 50 MG tablet Take 50 mg by mouth daily.          Objective:   Physical Exam BP 125/82 (BP Location: Left Arm, Patient Position: Sitting, Cuff Size: Large)   Pulse 85   Temp (!) 97 F (36.1 C) (Temporal)   Ht 5\' 8"  (1.727 m)   Wt 192 lb 3.2 oz (87.2 kg)   SpO2 97%  BMI 29.22 kg/m  General:   Well developed, NAD, BMI noted.  HEENT:  Normocephalic . Face symmetric, atraumatic Lungs:  CTA B Normal respiratory effort, no intercostal retractions, no accessory muscle use. Heart: RRR,  no murmur.  Abdomen:  Not distended, soft, non-tender. When the patient stands up, the right side of the abdomen seems slightly larger.  On physical exam by palpation and percussion there is no organomegaly or tenderness. Skin: Not pale. Not jaundice DM foot exam: No edema, good pedal pulses, pinprick examination normal Neurologic:  alert & oriented X3.  Speech normal, gait appropriate for age and unassisted Psych--  Cognition and judgment appear intact.  Cooperative with normal attention span and concentration.  Behavior appropriate. No anxious or depressed appearing.     Assessment    Assessment DM Hyperlipidemia CAD, stent 2008, negative stress test 2011 Prostate Cancer 01/2018, s/p XRT GERD, Nl EGD  2004 (done for hoarseness) ED HOH  PLAN: DM: On metformin, Altace, has a very good lifestyle.  Feet exam negative.  Check A1c, BMP. Hyperlipidemia: On Lipitor, Zetia, did have lunch today but requests a lipid panel.  Will do. CAD: Saw cardiology 11-2020, felt to be stable Asymmetric abdomen?  Exam is benign, will talk about possibly a ultrasound or CT but at the end we agreed on observation.   Mastalgia, mild left gynecomastia: Per Endo Preventive care: Discussed POA, had COVID vaccines x4 RTC 6 months  This visit occurred during the SARS-CoV-2 public health emergency.  Safety protocols were in place, including screening questions prior to the visit, additional usage of staff PPE, and extensive cleaning of exam room while observing appropriate contact time as indicated for disinfecting solutions.

## 2021-03-13 LAB — LIPID PANEL
Cholesterol: 100 mg/dL (ref 0–200)
HDL: 34.8 mg/dL — ABNORMAL LOW (ref >39.00)
LDL Cholesterol: 42 mg/dL (ref 0–99)
NonHDL: 65.48
Total CHOL/HDL Ratio: 3
Triglycerides: 115 mg/dL (ref 0.0–149.0)
VLDL: 23 mg/dL (ref 0.0–40.0)

## 2021-03-13 LAB — BASIC METABOLIC PANEL
BUN: 17 mg/dL (ref 6–23)
CO2: 29 mEq/L (ref 19–32)
Calcium: 9.8 mg/dL (ref 8.4–10.5)
Chloride: 103 mEq/L (ref 96–112)
Creatinine, Ser: 0.95 mg/dL (ref 0.40–1.50)
GFR: 79.03 mL/min (ref >60.00)
Glucose, Bld: 86 mg/dL (ref 70–99)
Potassium: 4.3 mEq/L (ref 3.5–5.1)
Sodium: 140 mEq/L (ref 135–145)

## 2021-03-13 LAB — HEMOGLOBIN A1C: Hgb A1c MFr Bld: 6.4 % (ref 4.6–6.5)

## 2021-03-13 NOTE — Assessment & Plan Note (Signed)
DM: On metformin, Altace, has a very good lifestyle.  Feet exam negative.  Check A1c, BMP. Hyperlipidemia: On Lipitor, Zetia, did have lunch today but requests a lipid panel.  Will do. CAD: Saw cardiology 11-2020, felt to be stable Asymmetric abdomen?  Exam is benign, will talk about possibly a ultrasound or CT but at the end we agreed on observation.   Mastalgia, mild left gynecomastia: Per Endo Preventive care: Discussed POA, had COVID vaccines x4 RTC 6 months

## 2021-03-25 ENCOUNTER — Other Ambulatory Visit: Payer: Self-pay

## 2021-03-25 ENCOUNTER — Encounter: Payer: Self-pay | Admitting: Internal Medicine

## 2021-03-25 ENCOUNTER — Ambulatory Visit (INDEPENDENT_AMBULATORY_CARE_PROVIDER_SITE_OTHER): Payer: Medicare Other | Admitting: Internal Medicine

## 2021-03-25 VITALS — BP 126/80 | HR 84 | Temp 98.4°F | Resp 16 | Ht 68.0 in | Wt 196.1 lb

## 2021-03-25 DIAGNOSIS — L989 Disorder of the skin and subcutaneous tissue, unspecified: Secondary | ICD-10-CM | POA: Diagnosis not present

## 2021-03-25 DIAGNOSIS — I2510 Atherosclerotic heart disease of native coronary artery without angina pectoris: Secondary | ICD-10-CM | POA: Diagnosis not present

## 2021-03-25 MED ORDER — NITROGLYCERIN 0.4 MG SL SUBL
0.4000 mg | SUBLINGUAL_TABLET | SUBLINGUAL | 3 refills | Status: DC | PRN
Start: 2021-03-25 — End: 2023-04-02

## 2021-03-25 NOTE — Progress Notes (Signed)
Subjective:    Patient ID: Harry Baker, male    DOB: [birth year], 75 y.o.   MRN: [id number]  DOS:  03/25/2021 Type of visit - description: Acute  Noted a skin lesion behind the right ear about a month ago. It is started itching and oozing some clear fluid last week and he got concerned. No bleeding. Could not see his dermatologist   Review of Systems See above   Past Medical History:  Diagnosis Date  . Arthritis   . CAD (coronary artery disease)    s/p stent 6/08(-) myoview, 8/11 negative stress test  . Diabetes mellitus   . Diverticulosis of colon   . Elevated PSA   . ERECTILE DYSFUNCTION   . GERD   . Hemorrhoid    problems off and on  . Herniated disc    sess chiropractor 2010  . HOH (hard of hearing)   . Hyperlipidemia   . Prostate cancer ([place])   . Prostate cancer ([place])   . Vocal cord polyp     Past Surgical History:  Procedure Laterality Date  . BIOPSY PROSTATE  02/15/2018  . CARDIAC CATHETERIZATION  05/1999   stent  . HEMORROIDECTOMY    . MOUTH SURGERY  2010  . TONSILLECTOMY    . TRANSRECTAL ULTRASOUND  02/15/2018  . VC cyst removal      Allergies as of 03/25/2021   No Known Allergies     Medication List       Accurate as of March 25, 2021 11:59 PM. If you have any questions, ask your nurse or doctor.        aspirin EC 81 MG tablet Take 81 mg by mouth daily.   atorvastatin 80 MG tablet Commonly known as: LIPITOR TAKE 1 TABLET BY MOUTH EVERY DAY   Biotin 2500 MCG Caps Take 2,500 mcg by mouth daily.   co-enzyme Q-10 30 MG capsule Take 30 mg by mouth daily.   ezetimibe 10 MG tablet Commonly known as: ZETIA TAKE 1 TABLET BY MOUTH EVERY DAY   fish oil-omega-3 fatty acids 1000 MG capsule Take 4 g by mouth daily.   GLUCOSAMINE-CHONDROITIN PO Take 1 tablet by mouth daily.   metFORMIN 850 MG tablet Commonly known as: GLUCOPHAGE Take 1 tablet (850 mg total) by mouth 3 (three) times daily with meals.   multivitamin capsule Take 1  capsule by mouth daily.   nitroGLYCERIN 0.4 MG SL tablet Commonly known as: NITROSTAT Place 1 tablet (0.4 mg total) under the tongue every 5 (five) minutes x 3 doses as needed for chest pain.   pantoprazole 40 MG tablet Commonly known as: PROTONIX Take 1 tablet (40 mg total) by mouth daily as needed.   ramipril 5 MG capsule Commonly known as: ALTACE Take 1 capsule (5 mg total) by mouth daily.   Vitamin D-3 25 MCG (1000 UT) Caps Take by mouth.   zinc gluconate 50 MG tablet Take 50 mg by mouth daily.          Objective:   Physical Exam HENT:     Head:     BP 126/80 (BP Location: Left Arm, Patient Position: Sitting, Cuff Size: Normal)   Pulse 84   Temp 98.4 F (36.9 C) (Oral)   Resp 16   Ht 5\' 8"  (1.727 m)   Wt 196 lb 2 oz (89 kg)   SpO2 98%   BMI 29.82 kg/m  General:   Well developed, NAD, BMI noted. HEENT:  Normocephalic . Face symmetric, atraumatic  Neurologic:  alert & oriented X3.  Speech normal, gait appropriate for age and unassisted Psych--  Cognition and judgment appear intact.  Cooperative with normal attention span and concentration.  Behavior appropriate. No anxious or depressed appearing.      Assessment     Assessment DM Hyperlipidemia CAD, stent 2008, negative stress test 2011 Prostate Cancer 01/2018, s/p XRT GERD, Nl EGD 2004 (done for hoarseness) ED HOH  PLAN: Skin lesion: As described above, does not seem to be a melanoma, probably SK.  Currently not irritated, recommend to see dermatology for further evaluation (not urgently). CAD: RF nitroglycerin    This visit occurred during the SARS-CoV-2 public health emergency.  Safety protocols were in place, including screening questions prior to the visit, additional usage of staff PPE, and extensive cleaning of exam room while observing appropriate contact time as indicated for disinfecting solutions.

## 2021-03-26 NOTE — Assessment & Plan Note (Signed)
Skin lesion: As described above, does not seem to be a melanoma, probably SK.  Currently not irritated, recommend to see dermatology for further evaluation (not urgently). CAD: RF nitroglycerin

## 2021-04-01 ENCOUNTER — Other Ambulatory Visit: Payer: Self-pay

## 2021-04-01 ENCOUNTER — Encounter: Payer: Self-pay | Admitting: Internal Medicine

## 2021-04-01 ENCOUNTER — Ambulatory Visit (INDEPENDENT_AMBULATORY_CARE_PROVIDER_SITE_OTHER): Payer: Medicare Other | Admitting: Internal Medicine

## 2021-04-01 VITALS — BP 120/71 | HR 69 | Ht 68.0 in | Wt 193.0 lb

## 2021-04-01 DIAGNOSIS — N62 Hypertrophy of breast: Secondary | ICD-10-CM | POA: Diagnosis not present

## 2021-04-01 LAB — LUTEINIZING HORMONE: LH: 7.78 m[IU]/mL (ref 3.10–34.60)

## 2021-04-01 NOTE — Progress Notes (Signed)
Name: Harry Baker  MRN/ DOB: [id number], [birth year]    Age/ Sex: 75 y.o., male     PCP: Colon Branch, MD   Reason for Endocrinology Evaluation: Gynecomastia     Initial Endocrinology Clinic Visit: 10/29/2020    PATIENT IDENTIFIER: Harry Baker is a 75 y.o., male with a past medical history of  CAD ( S/P stent placement in 2008) T2DM, Dyslipidemia and Prostate Cancer ( Dx4/2019) S/P XRT. He has followed with [facility] since 10/29/2020 for consultative assistance with management of his Gynecomastia .   HISTORICAL SUMMARY:   Pt has been diagnosed and treated for prostate cancer 01/2018 , he was treated with daily radiation and hormone blockers which ended by 2019.        His last PSA in 10/2019 was 0.075 ( In 02/2019 it was 0.03 ng/mL )       Pt presented to his PCP in 08/2020 with left gynecomastia . His FSH was found to be elevated at 35.5 mIU/mL ( Reference 1.4- 18.1 ). Normal LH at 10.59 mIU/mL , normal prolactin at 5.1 ng/mL as well as elevated testosterone at 892 ng/dL and elevated free testosterone at 76.0 pg/mL  with normal SHBG 62 nmol/L.   By the time he saw me in the clinic,left gynecomastia has resolved ( per pt) but he was noted with glandular breast tissue at 10 O'clock on the right breast and at 6 O'clock on the left with bilateral superior quadrants. Testicular volume was normal at 15 mL.  Labs revealed elevated FSH and LH at 33.3 and 11.1 mIU/mL respectively with total testosterone of 680 ng/dL but low free testosterone at 41 pg/mL . Brain MRI negative    SUBJECTIVE:   Today (04/01/2021):  Harry Baker is here for left gynecomastia   Gynecomastia stable, he denies pain, swelling of nipple discharge  He continues  with spontaneous erections, he is not sexually active  Has noted asymmetrical abdominal wall in the past  1    HISTORY:  Past Medical History:  Past Medical History:  Diagnosis Date   Arthritis    CAD (coronary artery disease)     s/p stent 6/08(-) myoview, 8/11 negative stress test   Diabetes mellitus    Diverticulosis of colon    Elevated PSA    ERECTILE DYSFUNCTION    GERD    Hemorrhoid    problems off and on   Herniated disc    sess chiropractor 2010   HOH (hard of hearing)    Hyperlipidemia    Prostate cancer ([place])    Prostate cancer ([place])    Vocal cord polyp    Past Surgical History:  Past Surgical History:  Procedure Laterality Date   BIOPSY PROSTATE  02/15/2018   CARDIAC CATHETERIZATION  05/1999   stent   HEMORROIDECTOMY     MOUTH SURGERY  2010   TONSILLECTOMY     TRANSRECTAL ULTRASOUND  02/15/2018   VC cyst removal     Social History:  reports that he has been smoking cigars. He has never used smokeless tobacco. He reports current alcohol use. He reports that he does not use drugs. Family History:  Family History  Problem Relation Age of Onset   Heart failure Mother    Stroke Father    Prostate cancer Other        uncle   Lung cancer Brother    Colon cancer Other        great aunt  Heart attack Other        GF   Diabetes Sister    Esophageal cancer Neg Hx    Stomach cancer Neg Hx      HOME MEDICATIONS: Allergies as of 04/01/2021   No Known Allergies      Medication List        Accurate as of April 01, 2021 10:14 AM. If you have any questions, ask your nurse or doctor.          aspirin EC 81 MG tablet Take 81 mg by mouth daily.   atorvastatin 80 MG tablet Commonly known as: LIPITOR TAKE 1 TABLET BY MOUTH EVERY DAY   Biotin 2500 MCG Caps Take 2,500 mcg by mouth daily.   co-enzyme Q-10 30 MG capsule Take 30 mg by mouth daily.   ezetimibe 10 MG tablet Commonly known as: ZETIA TAKE 1 TABLET BY MOUTH EVERY DAY   fish oil-omega-3 fatty acids 1000 MG capsule Take 4 g by mouth daily.   GLUCOSAMINE-CHONDROITIN PO Take 1 tablet by mouth daily.   metFORMIN 850 MG tablet Commonly known as: GLUCOPHAGE Take 1 tablet (850 mg total) by mouth 3 (three) times  daily with meals.   multivitamin capsule Take 1 capsule by mouth daily.   nitroGLYCERIN 0.4 MG SL tablet Commonly known as: NITROSTAT Place 1 tablet (0.4 mg total) under the tongue every 5 (five) minutes x 3 doses as needed for chest pain.   pantoprazole 40 MG tablet Commonly known as: PROTONIX Take 1 tablet (40 mg total) by mouth daily as needed.   ramipril 5 MG capsule Commonly known as: ALTACE Take 1 capsule (5 mg total) by mouth daily.   Vitamin D-3 25 MCG (1000 UT) Caps Take by mouth.   zinc gluconate 50 MG tablet Take 50 mg by mouth daily.          OBJECTIVE:   PHYSICAL EXAM: VS: BP 120/71   Pulse 69   Ht 5\' 8"  (1.727 m)   Wt 193 lb (87.5 kg)   SpO2 100%   BMI 29.35 kg/m    EXAM: General: Pt appears well and is in NAD  Lungs: Clear with good BS bilat with no rales, rhonchi, or wheezes  Heart: Auscultation: RRR.  Breast   Glandular tissue at 6 O'clock on the left and bilateral superior quadrants , non tender, no nipple discharge     Abdomen: Normoactive bowel sounds, soft, nontender, without masses or organomegaly palpable  Extremities:  BL LE: No pretibial edema normal ROM and strength.  Mental Status: Judgment, insight: Intact Memory: Intact for recent and remote events Mood and affect: No depression, anxiety, or agitation     DATA REVIEWED: Results for [name], [name] (MRN [id number]) as of 04/01/2021 12:25  Ref. Range 09/05/2020 07:57 10/31/2020 07:53 10/31/2020 07:54  Sex Horm Binding Glob, Serum Latest Ref Range: 22 - 77 nmol/L 62    Testosterone Latest Ref Range: 250 - 827 ng/dL 892 (H)    Testosterone, Bioavailable Latest Ref Range: 15.0 - 150.0 ng/dL 146.3    Testosterone Free Latest Ref Range: 6.0 - 73.0 pg/mL 76.0 (H)    Testosterone-% Free Latest Units: %   0.6  Testosterone, Serum (Total) Latest Units: ng/dL   680  Free Testosterone, Serum Latest Units: pg/mL   41 (L)  hCG,Beta Subunit,Qual,Serum Latest Units: mIU/mL  Negative        ASSESSMENT / PLAN / RECOMMENDATIONS:   Gynecomastia   - Gynecomastia stable on exam today,  since this is not chronic no medical treatment is effective and surgical intervention is the option if needed  - There has been a concern about elevated FSH and total as well as free testosterone , repeat testing showed normalization of testosterone as well as negative HCG but elevated LH  - This could be due to previous intake of hormone blockers during prostate cancer treatment in ~2019 vs local inflammation at the time  - Brain MRI was negative with NO evidence of pituitary tumor  - Reassurance provided at this time  pending repeat testosterone and LH       F/U PRN    Signed electronically by: Mack Guise, MD  [facility]  [address] [place], [place] [address] Phone: [phone] FAX: [phone]      CC: Colon Branch, [address] [place] [address] Phone: [phone]  Fax: [phone]   Return to Endocrinology clinic as below: Future Appointments  Date Time Provider [place]  09/15/2021  1:20 PM Colon Branch, MD [facility] [place]

## 2021-04-08 LAB — TESTOSTERONE FREE MS/DIALYSIS
Free Testosterone, Serum: 29 pg/mL — ABNORMAL LOW
Testosterone, Serum (Total): 574 ng/dL
Testosterone-% Free: 0.5 %

## 2021-04-17 ENCOUNTER — Other Ambulatory Visit: Payer: Self-pay | Admitting: Internal Medicine

## 2021-04-24 DIAGNOSIS — L570 Actinic keratosis: Secondary | ICD-10-CM | POA: Diagnosis not present

## 2021-04-24 DIAGNOSIS — D485 Neoplasm of uncertain behavior of skin: Secondary | ICD-10-CM | POA: Diagnosis not present

## 2021-04-24 DIAGNOSIS — C4442 Squamous cell carcinoma of skin of scalp and neck: Secondary | ICD-10-CM | POA: Diagnosis not present

## 2021-04-24 DIAGNOSIS — X32XXXS Exposure to sunlight, sequela: Secondary | ICD-10-CM | POA: Diagnosis not present

## 2021-04-24 DIAGNOSIS — L814 Other melanin hyperpigmentation: Secondary | ICD-10-CM | POA: Diagnosis not present

## 2021-04-24 DIAGNOSIS — L821 Other seborrheic keratosis: Secondary | ICD-10-CM | POA: Diagnosis not present

## 2021-04-24 DIAGNOSIS — L578 Other skin changes due to chronic exposure to nonionizing radiation: Secondary | ICD-10-CM | POA: Diagnosis not present

## 2021-05-13 DIAGNOSIS — C4442 Squamous cell carcinoma of skin of scalp and neck: Secondary | ICD-10-CM | POA: Diagnosis not present

## 2021-07-04 ENCOUNTER — Other Ambulatory Visit: Payer: Self-pay | Admitting: Cardiology

## 2021-07-04 DIAGNOSIS — I2510 Atherosclerotic heart disease of native coronary artery without angina pectoris: Secondary | ICD-10-CM

## 2021-07-08 DIAGNOSIS — Z23 Encounter for immunization: Secondary | ICD-10-CM | POA: Diagnosis not present

## 2021-08-05 ENCOUNTER — Ambulatory Visit (INDEPENDENT_AMBULATORY_CARE_PROVIDER_SITE_OTHER): Payer: Medicare Other

## 2021-08-05 VITALS — Ht 68.0 in | Wt 185.0 lb

## 2021-08-05 DIAGNOSIS — Z0000 Encounter for general adult medical examination without abnormal findings: Secondary | ICD-10-CM | POA: Diagnosis not present

## 2021-08-05 NOTE — Progress Notes (Addendum)
Subjective:   Harry Baker is a 75 y.o. male who presents for Medicare Annual/Subsequent preventive examination.  I connected with Harry Baker today by telephone and verified that I am speaking with the correct person using two identifiers. Location patient: home Location provider: work Persons participating in the virtual visit: patient, Marine scientist.    I discussed the limitations, risks, security and privacy concerns of performing an evaluation and management service by telephone and the availability of in person appointments. I also discussed with the patient that there may be a patient responsible charge related to this service. The patient expressed understanding and verbally consented to this telephonic visit.    Interactive audio and video telecommunications were attempted between this provider and patient, however failed, due to patient having technical difficulties OR patient did not have access to video capability.  We continued and completed visit with audio only.  Some vital signs may be absent or patient reported.   Time Spent with patient on telephone encounter: 20 minutes   Review of Systems     Cardiac Risk Factors include: advanced age (>14men, >73 women);male gender;diabetes mellitus;dyslipidemia     Objective:    Today's Vitals   08/05/21 1141  Weight: 185 lb (83.9 kg)  Height: 5\' 8"  (1.727 m)   Body mass index is 28.13 kg/m.  Advanced Directives 08/05/2021 07/10/2019 11/10/2018 07/08/2018  Does Patient Have a Medical Advance Directive? No No Yes No  Type of Advance Directive - - Living will -  Would patient like information on creating a medical advance directive? No - Patient declined No - Patient declined - Yes (MAU/Ambulatory/Procedural Areas - Information given)    Current Medications (verified) Outpatient Encounter Medications as of 08/05/2021  Medication Sig   aspirin EC 81 MG tablet Take 81 mg by mouth daily.    atorvastatin (LIPITOR) 80 MG tablet TAKE 1  TABLET BY MOUTH EVERY DAY   Biotin 2500 MCG CAPS Take 2,500 mcg by mouth daily.    Cholecalciferol (VITAMIN D-3) 25 MCG (1000 UT) CAPS Take by mouth.   co-enzyme Q-10 30 MG capsule Take 30 mg by mouth daily.   ezetimibe (ZETIA) 10 MG tablet TAKE 1 TABLET BY MOUTH EVERY DAY   fish oil-omega-3 fatty acids 1000 MG capsule Take 4 g by mouth daily.   GLUCOSAMINE-CHONDROITIN PO Take 1 tablet by mouth daily.   metFORMIN (GLUCOPHAGE) 850 MG tablet Take 1 tablet (850 mg total) by mouth 3 (three) times daily with meals.   Multiple Vitamin (MULTIVITAMIN) capsule Take 1 capsule by mouth daily.   ramipril (ALTACE) 5 MG capsule Take 1 capsule (5 mg total) by mouth daily.   zinc gluconate 50 MG tablet Take 50 mg by mouth daily.   nitroGLYCERIN (NITROSTAT) 0.4 MG SL tablet Place 1 tablet (0.4 mg total) under the tongue every 5 (five) minutes x 3 doses as needed for chest pain. (Patient not taking: Reported on 08/05/2021)   pantoprazole (PROTONIX) 40 MG tablet Take 1 tablet (40 mg total) by mouth daily as needed. (Patient not taking: No sig reported)   No facility-administered encounter medications on file as of 08/05/2021.    Allergies (verified) Patient has no known allergies.   History: Past Medical History:  Diagnosis Date   Arthritis    CAD (coronary artery disease)    s/p stent 6/08(-) myoview, 8/11 negative stress test   Diabetes mellitus    Diverticulosis of colon    Elevated PSA    ERECTILE DYSFUNCTION    GERD  Hemorrhoid    problems off and on   Herniated disc    sess chiropractor 2010   HOH (hard of hearing)    Hyperlipidemia    Prostate cancer ([place])    Prostate cancer ([place])    Vocal cord polyp    Past Surgical History:  Procedure Laterality Date   BIOPSY PROSTATE  02/15/2018   CARDIAC CATHETERIZATION  05/1999   stent   HEMORROIDECTOMY     MOUTH SURGERY  2010   TONSILLECTOMY     TRANSRECTAL ULTRASOUND  02/15/2018   VC cyst removal     Family History  Problem Relation  Age of Onset   Heart failure Mother    Stroke Father    Prostate cancer Other        uncle   Lung cancer Brother    Colon cancer Other        great aunt   Heart attack Other        GF   Diabetes Sister    Esophageal cancer Neg Hx    Stomach cancer Neg Hx    Social History   Socioeconomic History   Marital status: Married    Spouse name: Not on file   Number of children: 1   Years of education: Not on file   Highest education level: Not on file  Occupational History   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: [employer]  Tobacco Use   Smoking status: Light Smoker    Types: Cigars   Smokeless tobacco: Never   Tobacco comments:    occ cigar  Vaping Use   Vaping Use: Never used  Substance and Sexual Activity   Alcohol use: Yes    Comment: wine socially   Drug use: No   Sexual activity: Not on file  Other Topics Concern   Not on file  Social History Narrative   Married, 1 step son   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    Social Determinants of Health   Financial Resource Strain: Low Risk    Difficulty of Paying Living Expenses: Not hard at all  Food Insecurity: No Food Insecurity   Worried About Charity fundraiser in the Last Year: Never true   [place] in the Last Year: Never true  Transportation Needs: No Transportation Needs   Lack of Transportation (Medical): No   Lack of Transportation (Non-Medical): No  Physical Activity: Inactive   Days of Exercise per Week: 0 days   Minutes of Exercise per Session: 0 min  Stress: No Stress Concern Present   Feeling of Stress : Not at all  Social Connections: Moderately Isolated   Frequency of Communication with Friends and Family: More than three times a week   Frequency of Social Gatherings with Friends and Family: More than three times a week   Attends Religious Services: Never   Marine scientist or Organizations: No   Attends Music therapist: Never   Marital Status: Married     Tobacco Counseling Ready to quit: Not Answered Counseling given: Not Answered Tobacco comments: occ cigar   Clinical Intake:  Pre-visit preparation completed: Yes  Pain : No/denies pain     BMI - recorded: 28.13 Nutritional Status: BMI 25 -29 Overweight Nutritional Risks: None Diabetes: Yes CBG done?: No Did pt. bring in CBG monitor from home?: No (phone visit)  How often do you need to have someone help you when you read instructions, pamphlets, or  other written materials from your doctor or pharmacy?: 1 - Never  Diabetes:  Is the patient diabetic?  Yes  If diabetic, was a CBG obtained today?  No  Did the patient bring in their glucometer from home?  No phone visit How often do you monitor your CBG's? never.   Financial Strains and Diabetes Management:  Are you having any financial strains with the device, your supplies or your medication? No .  Does the patient want to be seen by Chronic Care Management for management of their diabetes?  No  Would the patient like to be referred to a Nutritionist or for Diabetic Management?  No   Diabetic Exams:  Diabetic Eye Exam: Completed 08/28/2020.   Diabetic Foot Exam: Completed 03/12/2021.   Interpreter Needed?: No  Information entered by :: Caroleen Hamman LPN   Activities of Daily Living In your present state of health, do you have any difficulty performing the following activities: 08/05/2021 09/03/2020  Hearing? N N  Vision? N N  Difficulty concentrating or making decisions? N N  Walking or climbing stairs? N N  Dressing or bathing? N N  Doing errands, shopping? N N  Preparing Food and eating ? N -  Using the Toilet? N -  In the past six months, have you accidently leaked urine? Y -  Do you have problems with loss of bowel control? N -  Managing your Medications? N -  Managing your Finances? N -  Housekeeping or managing your Housekeeping? N -  Some recent data might be hidden    Patient Care  Team: Colon Branch, MD as PCP - General Stanford Breed Denice Bors, MD as Consulting Physician (Cardiology) Lavena Bullion, DO as Consulting Physician (Gastroenterology) Alexis Frock, MD as Consulting Physician (Urology)  Indicate any recent Medical Services you may have received from other than [facility] providers in the past year (date may be approximate).     Assessment:   This is a routine wellness examination for [place].  Hearing/Vision screen Hearing Screening - Comments:: Bilateral hearing aids Vision Screening - Comments:: Wears glasses Last eye exam-08/2020  Dietary issues and exercise activities discussed: Current Exercise Habits: The patient has a physically strenuous job, but has no regular exercise apart from work., Exercise limited by: None identified   Goals Addressed             This Visit's Progress    Patient Stated       Lose some more weight-would like to reach 180       Depression Screen PHQ 2/9 Scores 08/05/2021 09/03/2020 07/10/2019 07/08/2018 04/18/2018 07/07/2017 07/06/2016  PHQ - 2 Score 0 0 0 0 0 0 0    Fall Risk Fall Risk  08/05/2021 03/12/2021 09/03/2020 08/30/2019 07/10/2019  Falls in the past year? 1 0 0 1 0  Number falls in past yr: 0 0 0 1 -  Injury with Fall? 0 0 0 0 -  Follow up Falls prevention discussed - Falls evaluation completed Falls evaluation completed -    FALL RISK PREVENTION PERTAINING TO THE HOME:  Any stairs in or around the home? Yes  If so, are there any without handrails? No  Home free of loose throw rugs in walkways, pet beds, electrical cords, etc? Yes  Adequate lighting in your home to reduce risk of falls? Yes   ASSISTIVE DEVICES UTILIZED TO PREVENT FALLS:  Life alert? No  Use of a cane, walker or w/c? No  Grab bars in the bathroom? No  Shower chair or bench in shower? No  Elevated toilet seat or a handicapped toilet? No   TIMED UP AND GO:  Was the test performed? No . Phone visit   Cognitive Function:Normal  cognitive status assessed by  this Nurse Health Advisor. No abnormalities found.          Immunizations Immunization History  Administered Date(s) Administered   Fluad Quad(high Dose 65+) 06/29/2019   Hep A / Hep B 05/30/2008, 06/29/2008, 11/29/2008   Influenza Split 08/16/2012   Influenza Whole 07/20/2007, 07/08/2010   Influenza, High Dose Seasonal PF 07/31/2013, 07/22/2015, 07/06/2016, 07/07/2017, 06/25/2018   Influenza,inj,Quad PF,6+ Mos 07/31/2014   Influenza-Unspecified 07/19/2020, 07/06/2021   Moderna Sars-Covid-2 Vaccination 11/16/2019, 12/20/2019, 08/16/2020   PFIZER Comirnaty(Gray Top)Covid-19 Tri-Sucrose Vaccine 02/05/2021   Pfizer Covid-19 Vaccine Bivalent Booster 23yrs & up 07/06/2021   Pneumococcal Conjugate-13 12/17/2014   Pneumococcal Polysaccharide-23 07/25/2002, 08/26/2007, 09/01/2012   Td 07/25/2002   Tdap 11/23/2011   Zoster Recombinat (Shingrix) 06/29/2019, 09/27/2019   Zoster, Live 08/28/2008    TDAP status: Up to date  Flu Vaccine status: Up to date  Pneumococcal vaccine status: Up to date  Covid-19 vaccine status: Completed vaccines  Qualifies for Shingles Vaccine? No   Zostavax completed Yes   Shingrix Completed?: Yes  Screening Tests Health Maintenance  Topic Date Due   COVID-19 Vaccine (5 - Booster for Moderna series) 06/07/2021   OPHTHALMOLOGY EXAM  08/28/2021   HEMOGLOBIN A1C  09/12/2021   TETANUS/TDAP  11/22/2021   FOOT EXAM  03/12/2022   COLONOSCOPY (Pts 45-11yrs Insurance coverage will need to be confirmed)  02/07/2023   INFLUENZA VACCINE  Completed   Hepatitis C Screening  Completed   Zoster Vaccines- Shingrix  Completed   HPV VACCINES  Aged Out    Health Maintenance  Health Maintenance Due  Topic Date Due   COVID-19 Vaccine (5 - Booster for Moderna series) 06/07/2021    Colorectal cancer screening: Type of screening: Colonoscopy. Completed 02/07/2020. Repeat every 3 years  Lung Cancer Screening: (Low Dose CT Chest  recommended if Age 64-80 years, 30 pack-year currently smoking OR have quit w/in 15years.) does not qualify.     Additional Screening:  Hepatitis C Screening: Completed 06/17/2015  Vision Screening: Recommended annual ophthalmology exams for early detection of glaucoma and other disorders of the eye. Is the patient up to date with their annual eye exam?  Yes  Who is the provider or what is the name of the office in which the patient attends annual eye exams? Patient unsure of name   Dental Screening: Recommended annual dental exams for proper oral hygiene  Community Resource Referral / Chronic Care Management: CRR required this visit?  No   CCM required this visit?  No      Plan:     I have personally reviewed and noted the following in the patient's chart:   Medical and social history Use of alcohol, tobacco or illicit drugs  Current medications and supplements including opioid prescriptions. Patient is not currently taking opioid prescriptions. Functional ability and status Nutritional status Physical activity Advanced directives List of other physicians Hospitalizations, surgeries, and ER visits in previous 12 months Vitals Screenings to include cognitive, depression, and falls Referrals and appointments  In addition, I have reviewed and discussed with patient certain preventive protocols, quality metrics, and best practice recommendations. A written personalized care plan for preventive services as well as general preventive health recommendations were provided to patient.   Due to this being a telephonic  visit, the after visit summary with patients personalized plan was offered to patient via mail or my-chart. Patient would like to access on my-chart.   Marta Antu, LPN   13/14/3888  Nurse Health Advisor  Nurse Notes: None   I have reviewed and agree with Health Coaches documentation.  Kathlene November, MD

## 2021-08-05 NOTE — Patient Instructions (Signed)
[name] , Thank you for taking time to complete your Medicare Wellness Visit. I appreciate your ongoing commitment to your health goals. Please review the following plan we discussed and let me know if I can assist you in the future.   Screening recommendations/referrals: Colonoscopy: Completed 02/07/2020-Due 02/07/2023 Recommended yearly ophthalmology/optometry visit for glaucoma screening and checkup Recommended yearly dental visit for hygiene and checkup  Vaccinations: Influenza vaccine: Up to date Pneumococcal vaccine: Up to date Tdap vaccine: Up to date-Due-11/22/2021 Shingles vaccine: Completed vaccines   Covid-19: Up to date  Advanced directives: Please bring a copy of Living Will and/or [place] for your chart once completed.   Conditions/risks identified: See problem list  Next appointment: Follow up in one year for your annual wellness visit.   Preventive Care 9 Years and Older, Male Preventive care refers to lifestyle choices and visits with your health care provider that can promote health and wellness. What does preventive care include? A yearly physical exam. This is also called an annual well check. Dental exams once or twice a year. Routine eye exams. Ask your health care provider how often you should have your eyes checked. Personal lifestyle choices, including: Daily care of your teeth and gums. Regular physical activity. Eating a healthy diet. Avoiding tobacco and drug use. Limiting alcohol use. Practicing safe sex. Taking low doses of aspirin every day. Taking vitamin and mineral supplements as recommended by your health care provider. What happens during an annual well check? The services and screenings done by your health care provider during your annual well check will depend on your age, overall health, lifestyle risk factors, and family history of disease. Counseling  Your health care provider may ask you questions about  your: Alcohol use. Tobacco use. Drug use. Emotional well-being. Home and relationship well-being. Sexual activity. Eating habits. History of falls. Memory and ability to understand (cognition). Work and work Statistician. Screening  You may have the following tests or measurements: Height, weight, and BMI. Blood pressure. Lipid and cholesterol levels. These may be checked every 5 years, or more frequently if you are over 33 years old. Skin check. Lung cancer screening. You may have this screening every year starting at age 44 if you have a 30-pack-year history of smoking and currently smoke or have quit within the past 15 years. Fecal occult blood test (FOBT) of the stool. You may have this test every year starting at age 22. Flexible sigmoidoscopy or colonoscopy. You may have a sigmoidoscopy every 5 years or a colonoscopy every 10 years starting at age 27. Prostate cancer screening. Recommendations will vary depending on your family history and other risks. Hepatitis C blood test. Hepatitis B blood test. Sexually transmitted disease (STD) testing. Diabetes screening. This is done by checking your blood sugar (glucose) after you have not eaten for a while (fasting). You may have this done every 1-3 years. Abdominal aortic aneurysm (AAA) screening. You may need this if you are a current or former smoker. Osteoporosis. You may be screened starting at age 64 if you are at high risk. Talk with your health care provider about your test results, treatment options, and if necessary, the need for more tests. Vaccines  Your health care provider may recommend certain vaccines, such as: Influenza vaccine. This is recommended every year. Tetanus, diphtheria, and acellular pertussis (Tdap, Td) vaccine. You may need a Td booster every 10 years. Zoster vaccine. You may need this after age 5. Pneumococcal 13-valent conjugate (PCV13) vaccine. One  dose is recommended after age 22. Pneumococcal  polysaccharide (PPSV23) vaccine. One dose is recommended after age 42. Talk to your health care provider about which screenings and vaccines you need and how often you need them. This information is not intended to replace advice given to you by your health care provider. Make sure you discuss any questions you have with your health care provider. Document Released: 11/01/2015 Document Revised: 06/24/2016 Document Reviewed: 08/06/2015 Elsevier Interactive Patient Education  2017 [employer] Prevention in the Home Falls can cause injuries. They can happen to people of all ages. There are many things you can do to make your home safe and to help prevent falls. What can I do on the outside of my home? Regularly fix the edges of walkways and driveways and fix any cracks. Remove anything that might make you trip as you walk through a door, such as a raised step or threshold. Trim any bushes or trees on the path to your home. Use bright outdoor lighting. Clear any walking paths of anything that might make someone trip, such as rocks or tools. Regularly check to see if handrails are loose or broken. Make sure that both sides of any steps have handrails. Any raised decks and porches should have guardrails on the edges. Have any leaves, snow, or ice cleared regularly. Use sand or salt on walking paths during winter. Clean up any spills in your garage right away. This includes oil or grease spills. What can I do in the bathroom? Use night lights. Install grab bars by the toilet and in the tub and shower. Do not use towel bars as grab bars. Use non-skid mats or decals in the tub or shower. If you need to sit down in the shower, use a plastic, non-slip stool. Keep the floor dry. Clean up any water that spills on the floor as soon as it happens. Remove soap buildup in the tub or shower regularly. Attach bath mats securely with double-sided non-slip rug tape. Do not have throw rugs and other  things on the floor that can make you trip. What can I do in the bedroom? Use night lights. Make sure that you have a light by your bed that is easy to reach. Do not use any sheets or blankets that are too big for your bed. They should not hang down onto the floor. Have a firm chair that has side arms. You can use this for support while you get dressed. Do not have throw rugs and other things on the floor that can make you trip. What can I do in the kitchen? Clean up any spills right away. Avoid walking on wet floors. Keep items that you use a lot in easy-to-reach places. If you need to reach something above you, use a strong step stool that has a grab bar. Keep electrical cords out of the way. Do not use floor polish or wax that makes floors slippery. If you must use wax, use non-skid floor wax. Do not have throw rugs and other things on the floor that can make you trip. What can I do with my stairs? Do not leave any items on the stairs. Make sure that there are handrails on both sides of the stairs and use them. Fix handrails that are broken or loose. Make sure that handrails are as long as the stairways. Check any carpeting to make sure that it is firmly attached to the stairs. Fix any carpet that is loose or worn.  Avoid having throw rugs at the top or bottom of the stairs. If you do have throw rugs, attach them to the floor with carpet tape. Make sure that you have a light switch at the top of the stairs and the bottom of the stairs. If you do not have them, ask someone to add them for you. What else can I do to help prevent falls? Wear shoes that: Do not have high heels. Have rubber bottoms. Are comfortable and fit you well. Are closed at the toe. Do not wear sandals. If you use a stepladder: Make sure that it is fully opened. Do not climb a closed stepladder. Make sure that both sides of the stepladder are locked into place. Ask someone to hold it for you, if possible. Clearly  mark and make sure that you can see: Any grab bars or handrails. First and last steps. Where the edge of each step is. Use tools that help you move around (mobility aids) if they are needed. These include: Canes. Walkers. Scooters. Crutches. Turn on the lights when you go into a dark area. Replace any light bulbs as soon as they burn out. Set up your furniture so you have a clear path. Avoid moving your furniture around. If any of your floors are uneven, fix them. If there are any pets around you, be aware of where they are. Review your medicines with your doctor. Some medicines can make you feel dizzy. This can increase your chance of falling. Ask your doctor what other things that you can do to help prevent falls. This information is not intended to replace advice given to you by your health care provider. Make sure you discuss any questions you have with your health care provider. Document Released: 08/01/2009 Document Revised: 03/12/2016 Document Reviewed: 11/09/2014 Elsevier Interactive Patient Education  2017 [employer].

## 2021-09-09 DIAGNOSIS — H11153 Pinguecula, bilateral: Secondary | ICD-10-CM | POA: Diagnosis not present

## 2021-09-09 DIAGNOSIS — H52203 Unspecified astigmatism, bilateral: Secondary | ICD-10-CM | POA: Diagnosis not present

## 2021-09-09 DIAGNOSIS — H2513 Age-related nuclear cataract, bilateral: Secondary | ICD-10-CM | POA: Diagnosis not present

## 2021-09-09 DIAGNOSIS — H04123 Dry eye syndrome of bilateral lacrimal glands: Secondary | ICD-10-CM | POA: Diagnosis not present

## 2021-09-09 DIAGNOSIS — Z7984 Long term (current) use of oral hypoglycemic drugs: Secondary | ICD-10-CM | POA: Diagnosis not present

## 2021-09-09 DIAGNOSIS — H43812 Vitreous degeneration, left eye: Secondary | ICD-10-CM | POA: Diagnosis not present

## 2021-09-09 DIAGNOSIS — H35363 Drusen (degenerative) of macula, bilateral: Secondary | ICD-10-CM | POA: Diagnosis not present

## 2021-09-09 DIAGNOSIS — H25013 Cortical age-related cataract, bilateral: Secondary | ICD-10-CM | POA: Diagnosis not present

## 2021-09-09 DIAGNOSIS — E119 Type 2 diabetes mellitus without complications: Secondary | ICD-10-CM | POA: Diagnosis not present

## 2021-09-09 DIAGNOSIS — H5213 Myopia, bilateral: Secondary | ICD-10-CM | POA: Diagnosis not present

## 2021-09-09 DIAGNOSIS — H353131 Nonexudative age-related macular degeneration, bilateral, early dry stage: Secondary | ICD-10-CM | POA: Diagnosis not present

## 2021-09-09 DIAGNOSIS — H524 Presbyopia: Secondary | ICD-10-CM | POA: Diagnosis not present

## 2021-09-09 LAB — HM DIABETES EYE EXAM

## 2021-09-15 ENCOUNTER — Ambulatory Visit: Payer: Medicare Other | Admitting: Internal Medicine

## 2021-09-15 ENCOUNTER — Other Ambulatory Visit: Payer: Self-pay | Admitting: Internal Medicine

## 2021-09-15 ENCOUNTER — Ambulatory Visit (INDEPENDENT_AMBULATORY_CARE_PROVIDER_SITE_OTHER): Payer: Medicare Other | Admitting: Internal Medicine

## 2021-09-15 ENCOUNTER — Other Ambulatory Visit: Payer: Self-pay

## 2021-09-15 ENCOUNTER — Encounter: Payer: Self-pay | Admitting: Internal Medicine

## 2021-09-15 VITALS — BP 122/78 | HR 78 | Temp 98.2°F | Resp 16 | Ht 68.0 in | Wt 193.0 lb

## 2021-09-15 DIAGNOSIS — E1159 Type 2 diabetes mellitus with other circulatory complications: Secondary | ICD-10-CM

## 2021-09-15 DIAGNOSIS — E7800 Pure hypercholesterolemia, unspecified: Secondary | ICD-10-CM | POA: Diagnosis not present

## 2021-09-15 DIAGNOSIS — K649 Unspecified hemorrhoids: Secondary | ICD-10-CM

## 2021-09-15 DIAGNOSIS — R809 Proteinuria, unspecified: Secondary | ICD-10-CM | POA: Diagnosis not present

## 2021-09-15 DIAGNOSIS — I2510 Atherosclerotic heart disease of native coronary artery without angina pectoris: Secondary | ICD-10-CM | POA: Diagnosis not present

## 2021-09-15 MED ORDER — HYDROCORTISONE ACETATE 25 MG RE SUPP: 25.0000 mg | Freq: Two times a day (BID) | RECTAL | 0 refills | Status: DC

## 2021-09-15 NOTE — Patient Instructions (Addendum)
Per our records you are due for your diabetic eye exam. Please contact your eye doctor to schedule an appointment. Please have them send copies of your office visit notes to [place]. Our fax number is [phone]. If you need a referral to an eye doctor please let us know.  For hemorrhoids: Use the suppositories as needed.  If you have severe symptoms: Let us know.    GO TO THE FRONT DESK, PLEASE SCHEDULE YOUR APPOINTMENTS Come back for fasting blood work at your convenience  Come back for a checkup in 6 months

## 2021-09-15 NOTE — Telephone Encounter (Signed)
Appt later today.

## 2021-09-15 NOTE — Progress Notes (Signed)
Subjective:    Patient ID: Harry Baker, male    DOB: 09/28/1946, 75 y.o.   MRN: [id number]  DOS:  09/15/2021 Type of visit - description: f/u  Feels well. Today we review his chronic medical problems and also his vaccinations. He has also several concerns that were addressed throughout the visit.  One of his concerns was the following: For the last few months he has noted "bubbles" in his urine.  Mostly early in the morning.  Denies gross hematuria or difficulty urinating.  Also in the last few days has noted a new hemorrhoid on palpation.  No itching, minimal pain, occasionally blood in the toilet paper.  Also reports orgasms without ejaculation.  Wonders about viagra rx.     Wt Readings from Last 3 Encounters:  09/15/21 193 lb (87.5 kg)  08/05/21 185 lb (83.9 kg)  04/01/21 193 lb (87.5 kg)     Review of Systems See above   Past Medical History:  Diagnosis Date   Arthritis    CAD (coronary artery disease)    s/p stent 6/08(-) myoview, 8/11 negative stress test   Diabetes mellitus    Diverticulosis of colon    Elevated PSA    ERECTILE DYSFUNCTION    GERD    Hemorrhoid    problems off and on   Herniated disc    sess chiropractor 2010   HOH (hard of hearing)    Hyperlipidemia    Prostate cancer ([place])    Prostate cancer ([place])    Vocal cord polyp     Past Surgical History:  Procedure Laterality Date   BIOPSY PROSTATE  02/15/2018   CARDIAC CATHETERIZATION  05/1999   stent   HEMORROIDECTOMY     MOUTH SURGERY  2010   TONSILLECTOMY     TRANSRECTAL ULTRASOUND  02/15/2018   VC cyst removal      Allergies as of 09/15/2021   No Known Allergies      Medication List        Accurate as of September 15, 2021 11:59 PM. If you have any questions, ask your nurse or doctor.          aspirin EC 81 MG tablet Take 81 mg by mouth daily.   atorvastatin 80 MG tablet Commonly known as: LIPITOR TAKE 1 TABLET BY MOUTH EVERY DAY   Biotin 2500 MCG Caps Take  2,500 mcg by mouth daily.   co-enzyme Q-10 30 MG capsule Take 30 mg by mouth daily.   ezetimibe 10 MG tablet Commonly known as: ZETIA TAKE 1 TABLET BY MOUTH EVERY DAY   fish oil-omega-3 fatty acids 1000 MG capsule Take 4 g by mouth daily.   GLUCOSAMINE-CHONDROITIN PO Take 1 tablet by mouth daily.   hydrocortisone 25 MG suppository Commonly known as: ANUSOL-HC Place 1 suppository (25 mg total) rectally 2 (two) times daily. Started by: Kathlene November, MD   metFORMIN 850 MG tablet Commonly known as: GLUCOPHAGE TAKE 1 TABLET (850 MG TOTAL) BY MOUTH 3 (THREE) TIMES DAILY WITH MEALS.   multivitamin capsule Take 1 capsule by mouth daily.   nitroGLYCERIN 0.4 MG SL tablet Commonly known as: NITROSTAT Place 1 tablet (0.4 mg total) under the tongue every 5 (five) minutes x 3 doses as needed for chest pain.   pantoprazole 40 MG tablet Commonly known as: PROTONIX Take 1 tablet (40 mg total) by mouth daily as needed.   ramipril 5 MG capsule Commonly known as: ALTACE TAKE 1 CAPSULE BY MOUTH EVERY DAY What  changed: how much to take Changed by: Kathlene November, MD   Vitamin D-3 25 MCG (1000 UT) Caps Take by mouth.   zinc gluconate 50 MG tablet Take 50 mg by mouth daily.           Objective:   Physical Exam BP 122/78 (BP Location: Left Arm, Patient Position: Sitting, Cuff Size: Small)   Pulse 78   Temp 98.2 F (36.8 C) (Oral)   Resp 16   Ht 5\' 8"  (1.727 m)   Wt 193 lb (87.5 kg)   SpO2 98%   BMI 29.35 kg/m  General: Well developed, NAD, BMI noted Neck: No  thyromegaly  HEENT:  Normocephalic . Face symmetric, atraumatic Lungs:  CTA B Normal respiratory effort, no intercostal retractions, no accessory muscle use. Heart: RRR,  no murmur.  Abdomen:  Not distended, soft, non-tender. No rebound or rigidity.   Lower extremities: no pretibial edema bilaterally DRE: External examination: He has few skin tags and a very small hemorrhoid at around 7:00.  Normal sphincter tone, no  stools.  No mass. Skin: Exposed areas without rash. Not pale. Not jaundice Neurologic:  alert & oriented X3.  Speech normal, gait appropriate for age and unassisted Strength symmetric and appropriate for age.  Psych: Cognition and judgment appear intact.  Cooperative with normal attention span and concentration.  Behavior appropriate. No anxious or depressed appearing. Anoscopy/procedure note: With the patient's verbal consent I  introduced well lubricated anoscope, + internal hemorrhoids, no stools, no active bleeding    Assessment    Assessment DM Hyperlipidemia CAD, stent 2008, negative stress test 2011 Prostate Cancer 01/2018, s/p XRT GERD, Nl EGD 2004 (done for hoarseness) ED HOH  PLAN: DM: Doing well with diet and exercise, on metformin, check A1c.  Patient wonders about CBGs, not mandatory to check them. Proteinuria?  Reports urine with "bubbles".  Check UA and micro. Hyperlipidemia: Continue Lipitor, Zetia, fish oil, check labs ED: Has occasional orgasm with no ejaculation, recommend to discuss with urology Hemorrhoids: Felt a lump a week ago, DRE and anoscopy consistent with hemorrhoids.  Recommend use suppositories prn, rx sent Preventive care reviewed. RTC fasting labs. RTC routine checkup in 6 months  This visit occurred during the SARS-CoV-2 public health emergency.  Safety protocols were in place, including screening questions prior to the visit, additional usage of staff PPE, and extensive cleaning of exam room while observing appropriate contact time as indicated for disinfecting solutions.

## 2021-09-16 NOTE — Assessment & Plan Note (Signed)
Assessment DM Hyperlipidemia CAD, stent 2008, negative stress test 2011 Prostate Cancer 01/2018, s/p XRT GERD, Nl EGD 2004 (done for hoarseness) ED HOH  PLAN: DM: Doing well with diet and exercise, on metformin, check A1c.  Patient wonders about CBGs, not mandatory to check them. Proteinuria?  Reports urine with "bubbles".  Check UA and micro. Hyperlipidemia: Continue Lipitor, Zetia, fish oil, check labs ED: Has occasional orgasm with no ejaculation, recommend to discuss with urology Hemorrhoids: Felt a lump a week ago, DRE and anoscopy consistent with hemorrhoids.  Recommend use suppositories prn, rx sent Preventive care reviewed. RTC fasting labs. RTC routine checkup in 6 months

## 2021-09-16 NOTE — Assessment & Plan Note (Signed)
--  Td 2013 - Pneumovax 2003, 2008, 2013;  Prevnar 11-2014. -zostavax 11-09; s/p shingrix x2 -COVID VAX booster last was 06-2021 - had a flu shot   -CCS: colonoscopy 2000 , 11-2009 , 01/2020, next  01/2023 per GI letter  -h/o prostate ca: per urology -Advance care planning package provided

## 2021-09-17 ENCOUNTER — Other Ambulatory Visit (INDEPENDENT_AMBULATORY_CARE_PROVIDER_SITE_OTHER): Payer: Medicare Other

## 2021-09-17 DIAGNOSIS — E7800 Pure hypercholesterolemia, unspecified: Secondary | ICD-10-CM

## 2021-09-17 DIAGNOSIS — K649 Unspecified hemorrhoids: Secondary | ICD-10-CM | POA: Diagnosis not present

## 2021-09-17 DIAGNOSIS — E1159 Type 2 diabetes mellitus with other circulatory complications: Secondary | ICD-10-CM | POA: Diagnosis not present

## 2021-09-17 DIAGNOSIS — R809 Proteinuria, unspecified: Secondary | ICD-10-CM

## 2021-09-17 LAB — URINALYSIS, ROUTINE W REFLEX MICROSCOPIC
Bilirubin Urine: NEGATIVE
Hgb urine dipstick: NEGATIVE
Ketones, ur: NEGATIVE
Leukocytes,Ua: NEGATIVE
Nitrite: NEGATIVE
RBC / HPF: NONE SEEN (ref 0–?)
Specific Gravity, Urine: 1.015 (ref 1.000–1.030)
Total Protein, Urine: NEGATIVE
Urine Glucose: NEGATIVE
Urobilinogen, UA: 0.2 (ref 0.0–1.0)
pH: 6 (ref 5.0–8.0)

## 2021-09-17 LAB — MICROALBUMIN / CREATININE URINE RATIO
Creatinine,U: 67.5 mg/dL
Microalb Creat Ratio: 1 mg/g (ref 0.0–30.0)
Microalb, Ur: <0.7 mg/dL (ref 0.0–1.9)

## 2021-09-18 LAB — LIPID PANEL
Cholesterol: 92 mg/dL (ref 0–200)
HDL: 43.1 mg/dL (ref >39.00)
LDL Cholesterol: 24 mg/dL (ref 0–99)
NonHDL: 49.14
Total CHOL/HDL Ratio: 2
Triglycerides: 126 mg/dL (ref 0.0–149.0)
VLDL: 25.2 mg/dL (ref 0.0–40.0)

## 2021-09-18 LAB — COMPREHENSIVE METABOLIC PANEL
ALT: 15 U/L (ref 0–53)
AST: 21 U/L (ref 0–37)
Albumin: 4.3 g/dL (ref 3.5–5.2)
Alkaline Phosphatase: 52 U/L (ref 39–117)
BUN: 15 mg/dL (ref 6–23)
CO2: 34 mEq/L — ABNORMAL HIGH (ref 19–32)
Calcium: 10.1 mg/dL (ref 8.4–10.5)
Chloride: 103 mEq/L (ref 96–112)
Creatinine, Ser: 1.09 mg/dL (ref 0.40–1.50)
GFR: 66.77 mL/min (ref >60.00)
Glucose, Bld: 56 mg/dL — ABNORMAL LOW (ref 70–99)
Potassium: 4.4 mEq/L (ref 3.5–5.1)
Sodium: 143 mEq/L (ref 135–145)
Total Bilirubin: 0.8 mg/dL (ref 0.2–1.2)
Total Protein: 6.5 g/dL (ref 6.0–8.3)

## 2021-09-18 LAB — CBC WITH DIFFERENTIAL/PLATELET
Basophils Absolute: 0.1 10*3/uL (ref 0.0–0.1)
Basophils Relative: 1.3 % (ref 0.0–3.0)
Eosinophils Absolute: 0.1 10*3/uL (ref 0.0–0.7)
Eosinophils Relative: 2.7 % (ref 0.0–5.0)
HCT: 41.5 % (ref 39.0–52.0)
Hemoglobin: 13.7 g/dL (ref 13.0–17.0)
Lymphocytes Relative: 26.1 % (ref 12.0–46.0)
Lymphs Abs: 1.2 10*3/uL (ref 0.7–4.0)
MCHC: 33.1 g/dL (ref 30.0–36.0)
MCV: 93.2 fl (ref 78.0–100.0)
Monocytes Absolute: 0.5 10*3/uL (ref 0.1–1.0)
Monocytes Relative: 10.7 % (ref 3.0–12.0)
Neutro Abs: 2.6 10*3/uL (ref 1.4–7.7)
Neutrophils Relative %: 59.2 % (ref 43.0–77.0)
Platelets: 195 10*3/uL (ref 150.0–400.0)
RBC: 4.45 Mil/uL (ref 4.22–5.81)
RDW: 13.9 % (ref 11.5–15.5)
WBC: 4.5 10*3/uL (ref 4.0–10.5)

## 2021-09-18 LAB — HEMOGLOBIN A1C: Hgb A1c MFr Bld: 6.3 % (ref 4.6–6.5)

## 2021-10-07 DIAGNOSIS — C61 Malignant neoplasm of prostate: Secondary | ICD-10-CM | POA: Diagnosis not present

## 2021-10-07 LAB — PSA: PSA: 0.051

## 2021-10-14 DIAGNOSIS — C61 Malignant neoplasm of prostate: Secondary | ICD-10-CM | POA: Diagnosis not present

## 2021-10-14 DIAGNOSIS — N5201 Erectile dysfunction due to arterial insufficiency: Secondary | ICD-10-CM | POA: Diagnosis not present

## 2021-10-15 ENCOUNTER — Encounter: Payer: Self-pay | Admitting: Internal Medicine

## 2021-10-21 DIAGNOSIS — L578 Other skin changes due to chronic exposure to nonionizing radiation: Secondary | ICD-10-CM | POA: Diagnosis not present

## 2021-10-21 DIAGNOSIS — Z85828 Personal history of other malignant neoplasm of skin: Secondary | ICD-10-CM | POA: Diagnosis not present

## 2021-10-21 DIAGNOSIS — L820 Inflamed seborrheic keratosis: Secondary | ICD-10-CM | POA: Diagnosis not present

## 2021-10-21 DIAGNOSIS — L570 Actinic keratosis: Secondary | ICD-10-CM | POA: Diagnosis not present

## 2021-10-21 DIAGNOSIS — L821 Other seborrheic keratosis: Secondary | ICD-10-CM | POA: Diagnosis not present

## 2021-10-21 DIAGNOSIS — D1801 Hemangioma of skin and subcutaneous tissue: Secondary | ICD-10-CM | POA: Diagnosis not present

## 2021-11-27 NOTE — Progress Notes (Signed)
HPI: FU coronary artery disease with prior PCI of his right coronary artery in August 2000. He also had residual LAD disease at that time. He has also had previous carotid Dopplers performed on September 24, 2005, which showed normal carotid arteries. His most recent Myoview in August of 2014 showed no ischemia or infarction and ejection fraction of 62%.  Abdominal ultrasound August 2019 showed no aneurysm.  Since I last saw him, he denies dyspnea on exertion, orthopnea, PND, pedal edema, chest pain or syncope.  Current Outpatient Medications  Medication Sig Dispense Refill   aspirin EC 81 MG tablet Take 81 mg by mouth daily.      atorvastatin (LIPITOR) 80 MG tablet TAKE 1 TABLET BY MOUTH EVERY DAY 90 tablet 3   Biotin 2500 MCG CAPS Take 2,500 mcg by mouth daily.      Cholecalciferol (VITAMIN D-3) 25 MCG (1000 UT) CAPS Take by mouth.     co-enzyme Q-10 30 MG capsule Take 30 mg by mouth daily.     ezetimibe (ZETIA) 10 MG tablet TAKE 1 TABLET BY MOUTH EVERY DAY 90 tablet 3   fish oil-omega-3 fatty acids 1000 MG capsule Take 4 g by mouth daily.     GLUCOSAMINE-CHONDROITIN PO Take 1 tablet by mouth daily.     metFORMIN (GLUCOPHAGE) 850 MG tablet TAKE 1 TABLET (850 MG TOTAL) BY MOUTH 3 (THREE) TIMES DAILY WITH MEALS. 270 tablet 1   Multiple Vitamin (MULTIVITAMIN) capsule Take 1 capsule by mouth daily.     nitroGLYCERIN (NITROSTAT) 0.4 MG SL tablet Place 1 tablet (0.4 mg total) under the tongue every 5 (five) minutes x 3 doses as needed for chest pain. 25 tablet 3   pantoprazole (PROTONIX) 40 MG tablet Take 40 mg by mouth daily as needed.     ramipril (ALTACE) 5 MG capsule TAKE 1 CAPSULE BY MOUTH EVERY DAY 90 capsule 1   zinc gluconate 50 MG tablet Take 50 mg by mouth daily.     No current facility-administered medications for this visit.     Past Medical History:  Diagnosis Date   Arthritis    CAD (coronary artery disease)    s/p stent 6/08(-) myoview, 8/11 negative stress test    Diabetes mellitus    Diverticulosis of colon    Elevated PSA    ERECTILE DYSFUNCTION    GERD    Hemorrhoid    problems off and on   Herniated disc    sess chiropractor 2010   HOH (hard of hearing)    Hyperlipidemia    Prostate cancer ([place])    Prostate cancer ([place])    Vocal cord polyp     Past Surgical History:  Procedure Laterality Date   BIOPSY PROSTATE  02/15/2018   CARDIAC CATHETERIZATION  05/1999   stent   HEMORROIDECTOMY     MOUTH SURGERY  2010   TONSILLECTOMY     TRANSRECTAL ULTRASOUND  02/15/2018   VC cyst removal      Social History   Socioeconomic History   Marital status: Married    Spouse name: Not on file   Number of children: 1   Years of education: Not on file   Highest education level: Not on file  Occupational History   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: [employer]  Tobacco Use   Smoking status: Light Smoker    Types: Cigars   Smokeless tobacco: Never   Tobacco comments:    occ  cigar  Vaping Use   Vaping Use: Never used  Substance and Sexual Activity   Alcohol use: Yes    Comment: wine socially   Drug use: No   Sexual activity: Not on file  Other Topics Concern   Not on file  Social History Narrative   Married, 1 step son   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    Social Determinants of Health   Financial Resource Strain: Low Risk    Difficulty of Paying Living Expenses: Not hard at all  Food Insecurity: No Food Insecurity   Worried About Charity fundraiser in the Last Year: Never true   Ran Out of Food in the Last Year: Never true  Transportation Needs: No Transportation Needs   Lack of Transportation (Medical): No   Lack of Transportation (Non-Medical): No  Physical Activity: Inactive   Days of Exercise per Week: 0 days   Minutes of Exercise per Session: 0 min  Stress: No Stress Concern Present   Feeling of Stress : Not at all  Social Connections: Moderately Isolated   Frequency of Communication with Friends  and Family: More than three times a week   Frequency of Social Gatherings with Friends and Family: More than three times a week   Attends Religious Services: Never   Marine scientist or Organizations: No   Attends Music therapist: Never   Marital Status: Married  Human resources officer Violence: Not At Risk   Fear of Current or Ex-Partner: No   Emotionally Abused: No   Physically Abused: No   Sexually Abused: No    Family History  Problem Relation Age of Onset   Heart failure Mother    Stroke Father    Prostate cancer Other        uncle   Lung cancer Brother    Colon cancer Other        great aunt   Heart attack Other        GF   Diabetes Sister    Esophageal cancer Neg Hx    Stomach cancer Neg Hx     ROS: no fevers or chills, productive cough, hemoptysis, dysphasia, odynophagia, melena, hematochezia, dysuria, hematuria, rash, seizure activity, orthopnea, PND, pedal edema, claudication. Remaining systems are negative.  Physical Exam: Well-developed well-nourished in no acute distress.  Skin is warm and dry.  HEENT is normal.  Neck is supple.  Soft right carotid bruit Chest is clear to auscultation with normal expansion.  Cardiovascular exam is regular rate and rhythm.  Abdominal exam nontender or distended. No masses palpated. Extremities show no edema. neuro grossly intact  ECG-normal sinus rhythm at a rate of 69, no ST changes.  Personally reviewed  A/P  1 coronary artery disease-patient denies recurrent chest pain.  Continue medical therapy.  Continue aspirin and statin.  2 hyperlipidemia-continue Zetia and Lipitor.  3 bruit-schedule ultrasound to exclude significant carotid disease.  Kirk Ruths, MD

## 2021-12-10 ENCOUNTER — Encounter: Payer: Self-pay | Admitting: Cardiology

## 2021-12-10 ENCOUNTER — Other Ambulatory Visit: Payer: Self-pay

## 2021-12-10 ENCOUNTER — Ambulatory Visit (INDEPENDENT_AMBULATORY_CARE_PROVIDER_SITE_OTHER): Payer: Medicare Other | Admitting: Cardiology

## 2021-12-10 VITALS — BP 120/64 | HR 69 | Ht 68.0 in | Wt 196.8 lb

## 2021-12-10 DIAGNOSIS — R0989 Other specified symptoms and signs involving the circulatory and respiratory systems: Secondary | ICD-10-CM | POA: Diagnosis not present

## 2021-12-10 DIAGNOSIS — I2510 Atherosclerotic heart disease of native coronary artery without angina pectoris: Secondary | ICD-10-CM

## 2021-12-10 DIAGNOSIS — E7800 Pure hypercholesterolemia, unspecified: Secondary | ICD-10-CM | POA: Diagnosis not present

## 2021-12-10 NOTE — Patient Instructions (Signed)
°  Testing/Procedures:  Your physician has requested that you have a carotid duplex. This test is an ultrasound of the carotid arteries in your neck. It looks at blood flow through these arteries that supply the brain with blood. Allow one hour for this exam. There are no restrictions or special instructions. [facility]-1ST FLOOR IMAGING DEPARTMENT   Follow-Up: At [facility], you and your health needs are our priority.  As part of our continuing mission to provide you with exceptional heart care, we have created designated Provider Care Teams.  These Care Teams include your primary Cardiologist (physician) and Advanced Practice Providers (APPs -  Physician Assistants and Nurse Practitioners) who all work together to provide you with the care you need, when you need it.  We recommend signing up for the patient portal called "MyChart".  Sign up information is provided on this After Visit Summary.  MyChart is used to connect with patients for Virtual Visits (Telemedicine).  Patients are able to view lab/test results, encounter notes, upcoming appointments, etc.  Non-urgent messages can be sent to your provider as well.   To learn more about what you can do with MyChart, go to [email or web address].    Your next appointment:   12 month(s)  The format for your next appointment:   In Person  Provider:   Kirk Ruths, MD

## 2021-12-15 ENCOUNTER — Other Ambulatory Visit: Payer: Self-pay

## 2021-12-15 ENCOUNTER — Ambulatory Visit (HOSPITAL_BASED_OUTPATIENT_CLINIC_OR_DEPARTMENT_OTHER)
Admission: RE | Admit: 2021-12-15 | Discharge: 2021-12-15 | Disposition: A | Discharge status: Home / Self Care | Payer: Medicare Other | Source: Ambulatory Visit | Attending: Cardiology | Admitting: Cardiology

## 2021-12-15 DIAGNOSIS — R0989 Other specified symptoms and signs involving the circulatory and respiratory systems: Secondary | ICD-10-CM | POA: Diagnosis not present

## 2021-12-15 DIAGNOSIS — I6522 Occlusion and stenosis of left carotid artery: Secondary | ICD-10-CM | POA: Diagnosis not present

## 2021-12-15 DIAGNOSIS — E119 Type 2 diabetes mellitus without complications: Secondary | ICD-10-CM | POA: Diagnosis not present

## 2021-12-15 DIAGNOSIS — E782 Mixed hyperlipidemia: Secondary | ICD-10-CM | POA: Diagnosis not present

## 2022-02-12 ENCOUNTER — Other Ambulatory Visit: Payer: Self-pay | Admitting: Internal Medicine

## 2022-03-17 ENCOUNTER — Encounter: Payer: Self-pay | Admitting: Internal Medicine

## 2022-03-17 ENCOUNTER — Ambulatory Visit (INDEPENDENT_AMBULATORY_CARE_PROVIDER_SITE_OTHER): Payer: Medicare Other | Admitting: Internal Medicine

## 2022-03-17 VITALS — BP 124/70 | HR 70 | Temp 98.5°F | Resp 18 | Ht 68.0 in | Wt 203.1 lb

## 2022-03-17 DIAGNOSIS — I2510 Atherosclerotic heart disease of native coronary artery without angina pectoris: Secondary | ICD-10-CM

## 2022-03-17 DIAGNOSIS — E1159 Type 2 diabetes mellitus with other circulatory complications: Secondary | ICD-10-CM | POA: Diagnosis not present

## 2022-03-17 DIAGNOSIS — R194 Change in bowel habit: Secondary | ICD-10-CM

## 2022-03-17 DIAGNOSIS — E7800 Pure hypercholesterolemia, unspecified: Secondary | ICD-10-CM | POA: Diagnosis not present

## 2022-03-17 MED ORDER — TETANUS-DIPHTH-ACELL PERTUSSIS 5-2.5-18.5 LF-MCG/0.5 IM SUSP: 0.5000 mL | Freq: Once | INTRAMUSCULAR | 0 refills | Status: AC

## 2022-03-17 NOTE — Progress Notes (Signed)
Subjective:    Patient ID: Harry Baker, male    DOB: 03-03-1946, 76 y.o.   MRN: [id number]  DOS:  03/17/2022 Type of visit - description: Follow-up  Today we talk about his chronic medical problems. Note from cardiology reviewed  Also, for the last 2 months he has consistently noted loose/watery stool after what seems to be a normal bowel movement. He denies fever chills or weight loss No abdominal pain.  No nausea vomiting.  No blood in the stools  Review of Systems See above   Past Medical History:  Diagnosis Date   Arthritis    CAD (coronary artery disease)    s/p stent 6/08(-) myoview, 8/11 negative stress test   Diabetes mellitus    Diverticulosis of colon    Elevated PSA    ERECTILE DYSFUNCTION    GERD    Hemorrhoid    problems off and on   Herniated disc    sess chiropractor 2010   HOH (hard of hearing)    Hyperlipidemia    Prostate cancer ([place])    Prostate cancer ([place])    Vocal cord polyp     Past Surgical History:  Procedure Laterality Date   BIOPSY PROSTATE  02/15/2018   CARDIAC CATHETERIZATION  05/1999   stent   HEMORROIDECTOMY     MOUTH SURGERY  2010   TONSILLECTOMY     TRANSRECTAL ULTRASOUND  02/15/2018   VC cyst removal      Current Outpatient Medications  Medication Instructions   aspirin EC 81 mg, Oral, Daily   atorvastatin (LIPITOR) 80 MG tablet TAKE 1 TABLET BY MOUTH EVERY DAY   Biotin 2,500 mcg, Oral, Daily   Cholecalciferol (VITAMIN D-3) 25 MCG (1000 UT) CAPS Oral   co-enzyme Q-10 30 mg, Oral, Daily   ezetimibe (ZETIA) 10 MG tablet TAKE 1 TABLET BY MOUTH EVERY DAY   fish oil-omega-3 fatty acids 4 g, Oral, Daily   GLUCOSAMINE-CHONDROITIN PO 1 tablet, Daily   metFORMIN (GLUCOPHAGE) 850 mg, Oral, 3 times daily with meals   Multiple Vitamin (MULTIVITAMIN) capsule 1 capsule, Oral, Daily,     nitroGLYCERIN (NITROSTAT) 0.4 mg, Sublingual, Every 5 min x3 PRN   pantoprazole (PROTONIX) 40 mg, Oral, Daily PRN   ramipril (ALTACE) 5 MG  capsule TAKE 1 CAPSULE BY MOUTH EVERY DAY   Tdap (BOOSTRIX) 5-2.5-18.5 LF-MCG/0.5 injection 0.5 mLs, Intramuscular,  Once   zinc gluconate 50 mg, Oral, Daily       Objective:   Physical Exam BP 124/70   Pulse 70   Temp 98.5 F (36.9 C) (Oral)   Resp 18   Ht '5\' 8"'$  (1.727 m)   Wt 203 lb 2 oz (92.1 kg)   SpO2 98%   BMI 30.89 kg/m  General:   Well developed, NAD, BMI noted.  HEENT:  Normocephalic . Face symmetric, atraumatic Lungs:  CTA B Normal respiratory effort, no intercostal retractions, no accessory muscle use. Heart: RRR,  no murmur.  Abdomen:  Not distended, soft, non-tender. No rebound or rigidity.   Skin: Not pale. Not jaundice Lower extremities: no pretibial edema bilaterally  Neurologic:  alert & oriented X3.  Speech normal, gait appropriate for age and unassisted Psych--  Cognition and judgment appear intact.  Cooperative with normal attention span and concentration.  Behavior appropriate. No anxious or depressed appearing.     Assessment    Assessment DM Hyperlipidemia CAD, stent 2008, negative stress test 2011 Prostate Cancer 01/2018, s/p XRT GERD, Nl EGD 2004 (done for  hoarseness) ED HOH  PLAN: DM: Continue metformin, check A1c Hyperlipidemia: On Lipitor and Zetia.  Last LDL satisfactory. Change in bowel habits: GI symptoms as described above, although no red flags this is a changing in habits, will refer to GI for possible earlier colonoscopy (next colonoscopy due 2024).  Check CBC and TSH CAD Saw cardiology 12/10/2021, felt to be stable. A carotid ultrasound was done, negative. Preventive care: Tdap Rx printed S/p covid vax bivalent  06/2022, okay to proceed with a booster Healthcare power of attorney information provided RTC 6 months

## 2022-03-17 NOTE — Assessment & Plan Note (Signed)
DM: Continue metformin, check A1c Hyperlipidemia: On Lipitor and Zetia.  Last LDL satisfactory. Change in bowel habits: GI symptoms as described above, although no red flags this is a changing in habits, will refer to GI for possible earlier colonoscopy (next colonoscopy due 2024).  Check CBC and TSH CAD Saw cardiology 12/10/2021, felt to be stable. A carotid ultrasound was done, negative. Preventive care: Tdap Rx printed S/p covid vax bivalent  06/2022, okay to proceed with a booster Healthcare power of attorney information provided RTC 6 months

## 2022-03-17 NOTE — Patient Instructions (Addendum)
Recommend to proceed with Tdap (tetanus) vaccine at your pharmacy- see printed prescription.      GO TO THE LAB : Get the blood work     [place], [place] back for a checkup in 6 months

## 2022-03-18 LAB — BASIC METABOLIC PANEL
BUN: 17 mg/dL (ref 6–23)
CO2: 27 mEq/L (ref 19–32)
Calcium: 9.7 mg/dL (ref 8.4–10.5)
Chloride: 104 mEq/L (ref 96–112)
Creatinine, Ser: 1.08 mg/dL (ref 0.40–1.50)
GFR: 67.28 mL/min (ref >60.00)
Glucose, Bld: 87 mg/dL (ref 70–99)
Potassium: 4.6 mEq/L (ref 3.5–5.1)
Sodium: 139 mEq/L (ref 135–145)

## 2022-03-18 LAB — CBC WITH DIFFERENTIAL/PLATELET
Basophils Absolute: 0.1 10*3/uL (ref 0.0–0.1)
Basophils Relative: 1.2 % (ref 0.0–3.0)
Eosinophils Absolute: 0.1 10*3/uL (ref 0.0–0.7)
Eosinophils Relative: 2.4 % (ref 0.0–5.0)
HCT: 38.9 % — ABNORMAL LOW (ref 39.0–52.0)
Hemoglobin: 13.2 g/dL (ref 13.0–17.0)
Lymphocytes Relative: 23.1 % (ref 12.0–46.0)
Lymphs Abs: 1.4 10*3/uL (ref 0.7–4.0)
MCHC: 33.9 g/dL (ref 30.0–36.0)
MCV: 93.8 fl (ref 78.0–100.0)
Monocytes Absolute: 0.5 10*3/uL (ref 0.1–1.0)
Monocytes Relative: 9 % (ref 3.0–12.0)
Neutro Abs: 3.8 10*3/uL (ref 1.4–7.7)
Neutrophils Relative %: 64.3 % (ref 43.0–77.0)
Platelets: 186 10*3/uL (ref 150.0–400.0)
RBC: 4.14 Mil/uL — ABNORMAL LOW (ref 4.22–5.81)
RDW: 13.9 % (ref 11.5–15.5)
WBC: 5.9 10*3/uL (ref 4.0–10.5)

## 2022-03-18 LAB — TSH: TSH: 2.47 u[IU]/mL (ref 0.35–5.50)

## 2022-03-18 LAB — HEMOGLOBIN A1C: Hgb A1c MFr Bld: 6.4 % (ref 4.6–6.5)

## 2022-04-23 ENCOUNTER — Other Ambulatory Visit: Payer: Self-pay | Admitting: Cardiology

## 2022-04-23 DIAGNOSIS — L570 Actinic keratosis: Secondary | ICD-10-CM | POA: Diagnosis not present

## 2022-04-23 DIAGNOSIS — D1801 Hemangioma of skin and subcutaneous tissue: Secondary | ICD-10-CM | POA: Diagnosis not present

## 2022-04-23 DIAGNOSIS — L578 Other skin changes due to chronic exposure to nonionizing radiation: Secondary | ICD-10-CM | POA: Diagnosis not present

## 2022-04-23 DIAGNOSIS — L821 Other seborrheic keratosis: Secondary | ICD-10-CM | POA: Diagnosis not present

## 2022-04-23 DIAGNOSIS — I2510 Atherosclerotic heart disease of native coronary artery without angina pectoris: Secondary | ICD-10-CM

## 2022-04-23 DIAGNOSIS — Z85828 Personal history of other malignant neoplasm of skin: Secondary | ICD-10-CM | POA: Diagnosis not present

## 2022-04-30 DIAGNOSIS — Z23 Encounter for immunization: Secondary | ICD-10-CM | POA: Diagnosis not present

## 2022-05-29 ENCOUNTER — Encounter: Payer: Self-pay | Admitting: Internal Medicine

## 2022-05-29 ENCOUNTER — Encounter: Payer: Self-pay | Admitting: Family Medicine

## 2022-05-29 ENCOUNTER — Telehealth (INDEPENDENT_AMBULATORY_CARE_PROVIDER_SITE_OTHER): Payer: Medicare Other | Admitting: Family Medicine

## 2022-05-29 DIAGNOSIS — U071 COVID-19: Secondary | ICD-10-CM | POA: Diagnosis not present

## 2022-05-29 MED ORDER — NIRMATRELVIR/RITONAVIR (PAXLOVID)TABLET: ORAL_TABLET | ORAL | 0 refills | Status: DC

## 2022-05-29 NOTE — Patient Instructions (Signed)
Paxlovid was discussed and sent in. Information sheet attached. If you decide to take, you need to start within the first 5 days of symptoms and skip your atorvastatin while taking. Otherwise, continue supportive measures including rest, hydration, humidifier use, steam showers, warm compresses to sinuses, warm liquids with lemon and honey, and over-the-counter cough, cold, and analgesics as needed.    Over the counter medications that may be helpful for symptoms:  Guaifenesin 1200 mg extended release tabs twice daily, with plenty of water For cough and congestion Brand name: Mucinex   Pseudoephedrine 30 mg, one or two tabs every 4 to 6 hours For sinus congestion Brand name: Sudafed You must get this from the pharmacy counter.  Oxymetazoline nasal spray each morning, one spray in each nostril, for NO MORE THAN 3 days  For nasal and sinus congestion Brand name: Afrin Saline nasal spray or Saline Nasal Irrigation 3-5 times a day For nasal and sinus congestion Brand names: Ocean or AYR Fluticasone nasal spray, one spray in each nostril, each morning (after oxymetazoline and saline, if used) For nasal and sinus congestion Brand name: Flonase Warm salt water gargles  For sore throat Every few hours as needed Alternate ibuprofen 400-600 mg and acetaminophen 1000 mg every 4-6 hours For fever, body aches, headache Brand names: Motrin or Advil and Tylenol Dextromethorphan 12-hour cough version 30 mg every 12 hours  For cough Brand name: Delsym Stop all other cold medications for now (Nyquil, Dayquil, Tylenol Cold, Theraflu, etc) and other non-prescription cough/cold preparations. Many of these have the same ingredients listed above and could cause an overdose of medication.   Herbal treatments that have been shown to be helpful in some patients include: Vitamin C '1000mg'$  per day Vitamin D 4000iU per day Zinc '100mg'$  per day Quercetin 25-'500mg'$  twice a day Melatonin 5-'10mg'$  at bedtime  General  Instructions Allow your body to rest Drink PLENTY of fluids Isolate yourself from everyone, even family, until test results have returned  If your COVID-19 test is positive Then you ARE INFECTED and you can pass the virus to others You must quarantine from others for a minimum of  5 days since symptoms started AND You are fever free for 24 hours WITHOUT any medication to reduce fever AND Your symptoms are improving Wear mask for the next 5 days If not improved by day 5, quarantine for the full 10 days. Do not go to the store or other public areas Do not go around household members who are not known to be infected with COVID-19 If you MUST leave your area of quarantine (example: go to a bathroom you share with others in your home), you must Wear a mask Wash your hands thoroughly Wipe down any surfaces you touch Do not share food, drinks, towels, or other items with other persons Dispose of your own tissues, food containers, etc  Once you have recovered, please continue good preventive care measures, including:  wearing a mask when in public wash your hands frequently avoid touching your face/nose/eyes cover coughs/sneezes with the inside of your elbow stay out of crowds keep a 6 foot distance from others  If you develop severe shortness of breath, uncontrolled fevers, coughing up blood, confusion, chest pain, or signs of dehydration (such as significantly decreased urine amounts or dizziness with standing) please go to the ER.

## 2022-05-29 NOTE — Telephone Encounter (Signed)
Pt has been scheduled.  °

## 2022-05-29 NOTE — Progress Notes (Signed)
Virtual Video Visit via MyChart Note  I connected with  Harry Baker on 05/29/22 at 10:20 AM EDT by the video enabled telemedicine application for MyChart, and verified that I am speaking with the correct person using two identifiers.   I introduced myself as a Designer, jewellery with the practice. We discussed the limitations of evaluation and management by telemedicine and the availability of in person appointments. The patient expressed understanding and agreed to proceed.  Participating parties in this visit include: The patient and the nurse practitioner listed.  The patient is: At home I am: In the office - [name]  Subjective:    CC: COVID   HPI: Harry Baker is a 76 y.o. year old male presenting today via [place] today for [place].  Yesterday patient started with sore throat and swollen lymph nodes. His first COVID test yesterday was negative, but he retested later and had a positive test. State he found out he was with several relatives at a wedding in [place] earlier in the week and some of them have tested positive for COVID as well. He has had rhinorrhea, loose stool, mild cough, and some fatigue. He denies any fevers, chills, body aches, chest pain, dyspnea, nausea, loss of taste/smell. Reports he is fully vaccinated.     Past medical history, Surgical history, Family history not pertinant except as noted below, Social history, Allergies, and medications have been entered into the medical record, reviewed, and corrections made.   Review of Systems:  All review of systems negative except what is listed in the HPI   Objective:    General:  Speaking clearly in complete sentences. Absent shortness of breath noted.   Alert and oriented x3.   Normal judgment.  Absent acute distress.   Impression and Recommendations:    1. COVID-19 - nirmatrelvir/ritonavir EUA (PAXLOVID) 20 x 150 MG & 10 x '100MG'$  TABS; 1 dose p.o. twice daily for  5 days, may use any available formulation at pharmacy with the same total dosage.  Dispense: 30 tablet; Refill: 0  Currently stable with relatively mild symptoms. Options discussed.  Paxlovid was discussed and sent in. Information sheet attached. If you decide to take, you need to start within the first 5 days of symptoms and skip your atorvastatin while taking. Otherwise, continue supportive measures including rest, hydration, humidifier use, steam showers, warm compresses to sinuses, warm liquids with lemon and honey, and over-the-counter cough, cold, and analgesics as needed. Patient aware of signs/symptoms requiring further/urgent evaluation.    Follow-up if symptoms worsen or fail to improve.    I discussed the assessment and treatment plan with the patient. The patient was provided an opportunity to ask questions and all were answered. The patient agreed with the plan and demonstrated an understanding of the instructions.   The patient was advised to call back or seek an in-person evaluation if the symptoms worsen or if the condition fails to improve as anticipated.   Terrilyn Saver, NP

## 2022-07-25 ENCOUNTER — Other Ambulatory Visit: Payer: Self-pay | Admitting: Internal Medicine

## 2022-08-19 ENCOUNTER — Telehealth: Payer: Self-pay | Admitting: Internal Medicine

## 2022-08-19 NOTE — Telephone Encounter (Signed)
Copied from [place] [id number]. Topic: Medicare AWV >> Aug 19, 2022  9:26 AM Gillis Santa wrote: Reason for CRM: LVM FOR PATIENT TO CALL [phone] TO SCHEDULE AWVS WITH HEALTH COACH

## 2022-09-09 ENCOUNTER — Encounter: Payer: Self-pay | Admitting: Internal Medicine

## 2022-09-14 DIAGNOSIS — Z794 Long term (current) use of insulin: Secondary | ICD-10-CM | POA: Diagnosis not present

## 2022-09-14 DIAGNOSIS — H25013 Cortical age-related cataract, bilateral: Secondary | ICD-10-CM | POA: Diagnosis not present

## 2022-09-14 DIAGNOSIS — H11153 Pinguecula, bilateral: Secondary | ICD-10-CM | POA: Diagnosis not present

## 2022-09-14 DIAGNOSIS — H43813 Vitreous degeneration, bilateral: Secondary | ICD-10-CM | POA: Diagnosis not present

## 2022-09-14 DIAGNOSIS — E119 Type 2 diabetes mellitus without complications: Secondary | ICD-10-CM | POA: Diagnosis not present

## 2022-09-14 DIAGNOSIS — H52203 Unspecified astigmatism, bilateral: Secondary | ICD-10-CM | POA: Diagnosis not present

## 2022-09-14 DIAGNOSIS — H353131 Nonexudative age-related macular degeneration, bilateral, early dry stage: Secondary | ICD-10-CM | POA: Diagnosis not present

## 2022-09-14 DIAGNOSIS — H524 Presbyopia: Secondary | ICD-10-CM | POA: Diagnosis not present

## 2022-09-14 DIAGNOSIS — H35363 Drusen (degenerative) of macula, bilateral: Secondary | ICD-10-CM | POA: Diagnosis not present

## 2022-09-14 DIAGNOSIS — H5213 Myopia, bilateral: Secondary | ICD-10-CM | POA: Diagnosis not present

## 2022-09-14 DIAGNOSIS — H04123 Dry eye syndrome of bilateral lacrimal glands: Secondary | ICD-10-CM | POA: Diagnosis not present

## 2022-09-14 DIAGNOSIS — H2513 Age-related nuclear cataract, bilateral: Secondary | ICD-10-CM | POA: Diagnosis not present

## 2022-09-14 LAB — HM DIABETES EYE EXAM

## 2022-09-15 ENCOUNTER — Encounter: Payer: Self-pay | Admitting: Internal Medicine

## 2022-09-15 DIAGNOSIS — Z23 Encounter for immunization: Secondary | ICD-10-CM | POA: Diagnosis not present

## 2022-09-21 ENCOUNTER — Ambulatory Visit: Payer: Medicare Other | Admitting: Internal Medicine

## 2022-09-22 ENCOUNTER — Ambulatory Visit: Payer: Medicare Other | Admitting: Internal Medicine

## 2022-09-30 ENCOUNTER — Encounter: Payer: Self-pay | Admitting: Internal Medicine

## 2022-09-30 ENCOUNTER — Ambulatory Visit (HOSPITAL_BASED_OUTPATIENT_CLINIC_OR_DEPARTMENT_OTHER)
Admission: RE | Admit: 2022-09-30 | Discharge: 2022-09-30 | Disposition: A | Discharge status: Home / Self Care | Payer: Medicare Other | Source: Ambulatory Visit | Attending: Internal Medicine | Admitting: Internal Medicine

## 2022-09-30 ENCOUNTER — Ambulatory Visit (INDEPENDENT_AMBULATORY_CARE_PROVIDER_SITE_OTHER): Payer: Medicare Other | Admitting: Internal Medicine

## 2022-09-30 VITALS — BP 132/80 | HR 87 | Temp 98.0°F | Resp 18 | Ht 68.0 in | Wt 196.1 lb

## 2022-09-30 DIAGNOSIS — E7800 Pure hypercholesterolemia, unspecified: Secondary | ICD-10-CM

## 2022-09-30 DIAGNOSIS — Z23 Encounter for immunization: Secondary | ICD-10-CM | POA: Diagnosis not present

## 2022-09-30 DIAGNOSIS — I2510 Atherosclerotic heart disease of native coronary artery without angina pectoris: Secondary | ICD-10-CM

## 2022-09-30 DIAGNOSIS — E1159 Type 2 diabetes mellitus with other circulatory complications: Secondary | ICD-10-CM | POA: Diagnosis not present

## 2022-09-30 DIAGNOSIS — J3089 Other allergic rhinitis: Secondary | ICD-10-CM | POA: Diagnosis not present

## 2022-09-30 DIAGNOSIS — M25552 Pain in left hip: Secondary | ICD-10-CM | POA: Insufficient documentation

## 2022-09-30 LAB — COMPREHENSIVE METABOLIC PANEL
ALT: 19 U/L (ref 0–53)
AST: 28 U/L (ref 0–37)
Albumin: 4.4 g/dL (ref 3.5–5.2)
Alkaline Phosphatase: 61 U/L (ref 39–117)
BUN: 23 mg/dL (ref 6–23)
CO2: 32 mEq/L (ref 19–32)
Calcium: 9.9 mg/dL (ref 8.4–10.5)
Chloride: 101 mEq/L (ref 96–112)
Creatinine, Ser: 0.93 mg/dL (ref 0.40–1.50)
GFR: 80.2 mL/min (ref >60.00)
Glucose, Bld: 94 mg/dL (ref 70–99)
Potassium: 4.5 mEq/L (ref 3.5–5.1)
Sodium: 139 mEq/L (ref 135–145)
Total Bilirubin: 0.7 mg/dL (ref 0.2–1.2)
Total Protein: 6.7 g/dL (ref 6.0–8.3)

## 2022-09-30 LAB — LIPID PANEL
Cholesterol: 92 mg/dL (ref 0–200)
HDL: 35.7 mg/dL — ABNORMAL LOW (ref >39.00)
LDL Cholesterol: 37 mg/dL (ref 0–99)
NonHDL: 55.99
Total CHOL/HDL Ratio: 3
Triglycerides: 93 mg/dL (ref 0.0–149.0)
VLDL: 18.6 mg/dL (ref 0.0–40.0)

## 2022-09-30 LAB — HEMOGLOBIN A1C: Hgb A1c MFr Bld: 6.4 % (ref 4.6–6.5)

## 2022-09-30 NOTE — Progress Notes (Unsigned)
Subjective:    Patient ID: Harry Baker, male    DOB: 01/18/1946, 76 y.o.   MRN: [id number]  DOS:  09/30/2022 Type of visit - description: f/u  In addition to his chronic medical problems were also addressed the following issues  On and off L hip pain for the last 3 months.  Denies any injury but has been more active taking care of his new puppy. Occasionally pains radiates down to the whole leg but he denies low back pain, paresthesias.  Has not taken any medication  Also long history of postnasal dripping and nasal congestion.  No fever or chills.  Symptoms are worse when he is outdoors.  Has not taken any medication  See last visit, had GI symptoms, they spontaneously resolved except for occasionally red blood from hemorrhoids.  Has hard issues, denies chest pain or difficulty breathing no lower extremity edema.  Review of Systems See above   Past Medical History:  Diagnosis Date   Arthritis    CAD (coronary artery disease)    s/p stent 6/08(-) myoview, 8/11 negative stress test   Diabetes mellitus    Diverticulosis of colon    Elevated PSA    ERECTILE DYSFUNCTION    GERD    Hemorrhoid    problems off and on   Herniated disc    sess chiropractor 2010   HOH (hard of hearing)    Hyperlipidemia    Prostate cancer ([place])    Prostate cancer ([place])    Vocal cord polyp     Past Surgical History:  Procedure Laterality Date   BIOPSY PROSTATE  02/15/2018   CARDIAC CATHETERIZATION  05/1999   stent   HEMORROIDECTOMY     MOUTH SURGERY  2010   TONSILLECTOMY     TRANSRECTAL ULTRASOUND  02/15/2018   VC cyst removal      Current Outpatient Medications  Medication Instructions   aspirin EC 81 mg, Oral, Daily   atorvastatin (LIPITOR) 80 MG tablet TAKE 1 TABLET BY MOUTH EVERY DAY   Biotin 2,500 mcg, Oral, Daily   Cholecalciferol (VITAMIN D-3) 25 MCG (1000 UT) CAPS Oral   co-enzyme Q-10 30 mg, Oral, Daily   ezetimibe (ZETIA) 10 MG tablet TAKE 1 TABLET BY MOUTH EVERY DAY    fish oil-omega-3 fatty acids 4 g, Oral, Daily   GLUCOSAMINE-CHONDROITIN PO 1 tablet, Daily   metFORMIN (GLUCOPHAGE) 850 mg, Oral, 3 times daily with meals   Multiple Vitamin (MULTIVITAMIN) capsule 1 capsule, Oral, Daily,     nirmatrelvir/ritonavir EUA (PAXLOVID) 20 x 150 MG & 10 x '100MG'$  TABS 1 dose p.o. twice daily for 5 days, may use any available formulation at pharmacy with the same total dosage.   nitroGLYCERIN (NITROSTAT) 0.4 mg, Sublingual, Every 5 min x3 PRN   pantoprazole (PROTONIX) 40 mg, Daily PRN   ramipril (ALTACE) 5 mg, Oral, Daily   zinc gluconate 50 mg, Oral, Daily       Objective:   Physical Exam BP 132/80   Pulse 87   Temp 98 F (36.7 C) (Oral)   Resp 18   Ht '5\' 8"'$  (1.727 m)   Wt 196 lb 2 oz (89 kg)   SpO2 96%   BMI 29.82 kg/m  General:   Well developed, NAD, BMI noted. HEENT:  Normocephalic . Face symmetric, atraumatic Nose slightly congested, sinuses non-TTP. Lungs:  CTA B Normal respiratory effort, no intercostal retractions, no accessory muscle use. Heart: RRR,  no murmur.  Lower extremities: No TTP at  the trochanteric bursa's. Motor exam normal Rotation of the hips: Mild pain on the L side. Skin: Not pale. Not jaundice Neurologic:  alert & oriented X3.  Speech normal, gait appropriate for age and unassisted Psych--  Cognition and judgment appear intact.  Cooperative with normal attention span and concentration.  Behavior appropriate. No anxious or depressed appearing.      Assessment    Assessment DM Hyperlipidemia CAD, stent 2008, negative stress test 2011 Prostate Cancer 01/2018, s/p XRT GERD, Nl EGD 2004 (done for hoarseness) ED HOH  PLAN DM: Metformin, no ambulatory CBGs, check A1c. Hyperlipidemia: On Lipitor, Zetia.  Check FLP. CAD: Asymptomatic, continue aspirin, statins, ramipril.  Checking labs. Rhinitis: Perennial rhinitis, worse when he is outdoors, on no medications, trial with Flonase, see AVS.  Call if not better, add  Astepro?. Left hip pain: As described above, symptoms are mild, has not required any medication.  Due to H/o prostate cancer we will get a x-ray.  Patient will let me know if he is not gradually improving Change in bowel habits: See last office visit, labs okay, symptoms spontaneously resolved, decided not to pursue GI eval.  He plans to proceed with a colonoscopy in April when he is due. Preventive care reviewed RTC 6 months   --Td 2023 - Pneumovax 2003, 2008, 2013;  Prevnar 11-2014. PNM 20: Today - s/p RSV -zostavax 11-09; s/p shingrix x2 -COVID VAX UTD per pt   - had a flu shot   -CCS: colonoscopy 2000 , 11-2009 , 01/2020, next  01/2023 per GI letter  -h/o prostate ca: per urology -Advance care planning document scanned today      5: DM: Continue metformin, check A1c Hyperlipidemia: On Lipitor and Zetia.  Last LDL satisfactory. Change in bowel habits: GI symptoms as described above, although no red flags this is a changing in habits, will refer to GI for possible earlier colonoscopy (next colonoscopy due 2024).  Check CBC and TSH CAD Saw cardiology 12/10/2021, felt to be stable. A carotid ultrasound was done, negative. Preventive care: Tdap Rx printed S/p covid vax bivalent  06/2022, okay to proceed with a booster Healthcare power of attorney information provided RTC 6 months

## 2022-09-30 NOTE — Patient Instructions (Signed)
For allergies: Get over-the-counter Flonase nasal spray: 2 sprays on each side of the nose every day.  GO TO THE LAB : Get the blood work     [place], [place] back for   a checkup in 6 months.  STOP BY THE FIRST FLOOR:  get the XR

## 2022-10-01 NOTE — Assessment & Plan Note (Signed)
--  Td 2023 - Pneumovax 2003, 2008, 2013;  Prevnar 11-2014. PNM 20: Today - s/p RSV -zostavax 11-09; s/p shingrix x2 -COVID VAX UTD per pt   - had a flu shot   -CCS: colonoscopy 2000 , 11-2009 , 01/2020, next  01/2023 per GI letter  -h/o prostate ca: per urology -Advance care planning document scanned today

## 2022-10-01 NOTE — Assessment & Plan Note (Signed)
DM: on Metformin, no ambulatory CBGs, check A1c. Hyperlipidemia: On Lipitor, Zetia.  Check FLP. CAD: Asymptomatic, continue aspirin, statins, ramipril.  Checking labs. Rhinitis: Perennial rhinitis, worse when he is outdoors, on no medications, trial with Flonase, see AVS.  Call if not better, add Astepro?. Left hip pain: As described above, symptoms are mild, has not required any medication.  Due to H/o prostate cancer we will get a x-ray.  Patient will let me know if he is not gradually improving Change in bowel habits: See last office visit, labs okay, symptoms spontaneously resolved, decided not to pursue GI eval.  He plans to proceed with a colonoscopy in April when he is due. Preventive care reviewed RTC 6 months

## 2022-10-02 ENCOUNTER — Ambulatory Visit (INDEPENDENT_AMBULATORY_CARE_PROVIDER_SITE_OTHER): Payer: Medicare Other | Admitting: *Deleted

## 2022-10-02 DIAGNOSIS — Z0000 Encounter for general adult medical examination without abnormal findings: Secondary | ICD-10-CM | POA: Diagnosis not present

## 2022-10-02 NOTE — Progress Notes (Addendum)
Subjective:   Harry Baker is a 76 y.o. male who presents for Medicare Annual/Subsequent preventive examination.  I connected with  Saron Tweed Discher on 10/02/22 by a audio enabled telemedicine application and verified that I am speaking with the correct person using two identifiers.  Patient Location: Home  Provider Location: Office/Clinic  I discussed the limitations of evaluation and management by telemedicine. The patient expressed understanding and agreed to proceed.   Review of Systems    Defer to PCP Cardiac Risk Factors include: advanced age (>61mn, >>62women);male gender;diabetes mellitus;dyslipidemia     Objective:    There were no vitals filed for this visit. There is no height or weight on file to calculate BMI.     10/02/2022   11:03 AM 08/05/2021   11:47 AM 07/10/2019    8:22 AM 11/10/2018    9:11 AM 07/08/2018    8:30 AM  Advanced Directives  Does Patient Have a Medical Advance Directive? Yes No No Yes No  Type of [place]   Living will   Does patient want to make changes to medical advance directive? No - Patient declined      Copy of [place]? Yes - validated most recent copy scanned in chart (See row information)      Would patient like information on creating a medical advance directive?  No - Patient declined No - Patient declined  Yes (MAU/Ambulatory/Procedural Areas - Information given)    Current Medications (verified) Outpatient Encounter Medications as of 10/02/2022  Medication Sig   aspirin EC 81 MG tablet Take 81 mg by mouth daily.    atorvastatin (LIPITOR) 80 MG tablet TAKE 1 TABLET BY MOUTH EVERY DAY   Biotin 2500 MCG CAPS Take 2,500 mcg by mouth daily.    Cholecalciferol (VITAMIN D-3) 25 MCG (1000 UT) CAPS Take by mouth.   co-enzyme Q-10 30 MG capsule Take 30 mg by mouth daily.   ezetimibe (ZETIA) 10 MG tablet TAKE 1 TABLET BY MOUTH EVERY DAY   fish oil-omega-3  fatty acids 1000 MG capsule Take 4 g by mouth daily.   GLUCOSAMINE-CHONDROITIN PO Take 1 tablet by mouth daily.   metFORMIN (GLUCOPHAGE) 850 MG tablet Take 1 tablet (850 mg total) by mouth 3 (three) times daily with meals.   Multiple Vitamin (MULTIVITAMIN) capsule Take 1 capsule by mouth daily.   nitroGLYCERIN (NITROSTAT) 0.4 MG SL tablet Place 1 tablet (0.4 mg total) under the tongue every 5 (five) minutes x 3 doses as needed for chest pain. (Patient not taking: Reported on 03/17/2022)   ramipril (ALTACE) 5 MG capsule Take 1 capsule (5 mg total) by mouth daily.   zinc gluconate 50 MG tablet Take 50 mg by mouth daily.   [DISCONTINUED] pantoprazole (PROTONIX) 40 MG tablet Take 40 mg by mouth daily as needed. (Patient not taking: Reported on 10/01/2022)   No facility-administered encounter medications on file as of 10/02/2022.    Allergies (verified) Patient has no known allergies.   History: Past Medical History:  Diagnosis Date   Arthritis    CAD (coronary artery disease)    s/p stent 6/08(-) myoview, 8/11 negative stress test   Diabetes mellitus    Diverticulosis of colon    Elevated PSA    ERECTILE DYSFUNCTION    GERD    Hemorrhoid    problems off and on   Herniated disc    sess chiropractor 2010   HOH (hard of  hearing)    Hyperlipidemia    Prostate cancer ([place])    Prostate cancer [facility])    Vocal cord polyp    Past Surgical History:  Procedure Laterality Date   BIOPSY PROSTATE  02/15/2018   CARDIAC CATHETERIZATION  05/1999   stent   HEMORROIDECTOMY     MOUTH SURGERY  2010   TONSILLECTOMY     TRANSRECTAL ULTRASOUND  02/15/2018   VC cyst removal     Family History  Problem Relation Age of Onset   Heart failure Mother    Stroke Father    Prostate cancer Other        uncle   Lung cancer Brother    Colon cancer Other        great aunt   Heart attack Other        GF   Diabetes Sister    Esophageal cancer Neg Hx    Stomach cancer Neg Hx    Social History    Socioeconomic History   Marital status: Married    Spouse name: Not on file   Number of children: 1   Years of education: Not on file   Highest education level: Not on file  Occupational History   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: [employer]  Tobacco Use   Smoking status: Light Smoker    Types: Cigars   Smokeless tobacco: Never   Tobacco comments:    occ cigar  Vaping Use   Vaping Use: Never used  Substance and Sexual Activity   Alcohol use: Yes    Comment: wine socially   Drug use: No   Sexual activity: Not on file  Other Topics Concern   Not on file  Social History Narrative   Married, 1 step son   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    Social Determinants of Health   Financial Resource Strain: Low Risk  (08/05/2021)   Overall Financial Resource Strain (CARDIA)    Difficulty of Paying Living Expenses: Not hard at all  Food Insecurity: No Food Insecurity (10/02/2022)   Hunger Vital Sign    Worried About Running Out of Food in the Last Year: Never true    Ran Out of Food in the Last Year: Never true  Transportation Needs: No Transportation Needs (10/02/2022)   PRAPARE - Hydrologist (Medical): No    Lack of Transportation (Non-Medical): No  Physical Activity: Inactive (08/05/2021)   Exercise Vital Sign    Days of Exercise per Week: 0 days    Minutes of Exercise per Session: 0 min  Stress: No Stress Concern Present (08/05/2021)   [place]    Feeling of Stress : Not at all  Social Connections: Moderately Isolated (08/05/2021)   Social Connection and Isolation Panel [NHANES]    Frequency of Communication with Friends and Family: More than three times a week    Frequency of Social Gatherings with Friends and Family: More than three times a week    Attends Religious Services: Never    Marine scientist or Organizations: No    Attends Programme researcher, broadcasting/film/video: Never    Marital Status: Married    Tobacco Counseling Ready to quit: Not Answered Counseling given: Not Answered Tobacco comments: occ cigar   Clinical Intake:  Pre-visit preparation completed: Yes  Pain : No/denies pain  How often do you need to have someone help you when  you read instructions, pamphlets, or other written materials from your doctor or pharmacy?: 1 - Never  Diabetic? Nutrition Risk Assessment:  Has the patient had any N/V/D within the last 2 months?  No  Does the patient have any non-healing wounds?  No  Has the patient had any unintentional weight loss or weight gain?  No   Diabetes:  Is the patient diabetic?  Yes  If diabetic, was a CBG obtained today?  No  Did the patient bring in their glucometer from home?  No  How often do you monitor your CBG's? Never.   Financial Strains and Diabetes Management:  Are you having any financial strains with the device, your supplies or your medication? No .  Does the patient want to be seen by Chronic Care Management for management of their diabetes?  No  Would the patient like to be referred to a Nutritionist or for Diabetic Management?  No   Diabetic Exams:  Diabetic Eye Exam: Completed 09/14/22 Diabetic Foot Exam: Overdue, Pt has been advised about the importance in completing this exam. Pt is scheduled for diabetic foot exam on N/a.  Interpreter Needed?: No  Information entered by :: Beatris Ship, [place]   Activities of Daily Living    10/02/2022   11:11 AM  In your present state of health, do you have any difficulty performing the following activities:  Hearing? 1  Comment wears hearing aids  Vision? 0  Difficulty concentrating or making decisions? 0  Walking or climbing stairs? 0  Dressing or bathing? 0  Doing errands, shopping? 0  Preparing Food and eating ? N  Using the Toilet? N  In the past six months, have you accidently leaked urine? Y  Do you have problems with loss  of bowel control? N  Managing your Medications? N  Managing your Finances? N  Housekeeping or managing your Housekeeping? N    Patient Care Team: Colon Branch, MD as PCP - General Stanford Breed, Denice Bors, MD as PCP - Cardiology (Cardiology) Stanford Breed Denice Bors, MD as Consulting Physician (Cardiology) Lavena Bullion, DO as Consulting Physician (Gastroenterology) Alexis Frock, MD as Consulting Physician (Urology)  Indicate any recent Medical Services you may have received from other than [facility] providers in the past year (date may be approximate).     Assessment:   This is a routine wellness examination for [place].  Hearing/Vision screen No results found.  Dietary issues and exercise activities discussed: Current Exercise Habits: The patient has a physically strenuous job, but has no regular exercise apart from work. (works on a farm), Exercise limited by: None identified   Goals Addressed   None    Depression Screen    10/02/2022   11:04 AM 09/30/2022   11:40 AM 03/17/2022    1:04 PM 08/05/2021   11:53 AM 09/03/2020    1:26 PM 07/10/2019    8:24 AM 07/08/2018    8:33 AM  PHQ 2/9 Scores  PHQ - 2 Score 0 0 0 0 0 0 0    Fall Risk    10/02/2022   11:03 AM 09/30/2022   11:38 AM 03/17/2022    1:04 PM 08/05/2021   11:50 AM 03/12/2021    3:24 PM  Fall Risk   Falls in the past year? 1 0 0 1 0  Number falls in past yr: 0 0 0 0 0  Injury with Fall? 0 0 0 0 0  Risk for fall due to : No Fall Risks  Follow up Falls evaluation completed Falls evaluation completed Falls evaluation completed Falls prevention discussed     FALL RISK PREVENTION PERTAINING TO THE HOME:  Any stairs in or around the home? Yes  If so, are there any without handrails? No  Home free of loose throw rugs in walkways, pet beds, electrical cords, etc? Yes  Adequate lighting in your home to reduce risk of falls? Yes   ASSISTIVE DEVICES UTILIZED TO PREVENT FALLS:  Life alert?  Wears Apple watch that  detects falls. Use of a cane, walker or w/c? No  Grab bars in the bathroom? No  Shower chair or bench in shower? Yes  Elevated toilet seat or a handicapped toilet? Yes   TIMED UP AND GO:  Was the test performed?  No, audio visit .    Cognitive Function:        10/02/2022   11:16 AM  6CIT Screen  What Year? 0 points  What month? 0 points  What time? 0 points  Count back from 20 0 points  Months in reverse 0 points  Repeat phrase 0 points  Total Score 0 points    Immunizations Immunization History  Administered Date(s) Administered   Covid-19, Mrna,Vaccine(Spikevax)89yr and older 09/15/2022   Fluad Quad(high Dose 65+) 06/29/2019, 09/15/2022   Hep A / Hep B 05/30/2008, 06/29/2008, 11/29/2008   Influenza Split 08/16/2012   Influenza Whole 07/20/2007, 07/08/2010   Influenza, High Dose Seasonal PF 07/31/2013, 07/22/2015, 07/06/2016, 07/07/2017, 06/25/2018   Influenza,inj,Quad PF,6+ Mos 07/31/2014   Influenza-Unspecified 07/19/2020, 07/06/2021   Moderna Sars-Covid-2 Vaccination 11/16/2019, 12/20/2019, 08/16/2020   PFIZER Comirnaty(Gray Top)Covid-19 Tri-Sucrose Vaccine 02/05/2021   PNEUMOCOCCAL CONJUGATE-20 09/30/2022   Pfizer Covid-19 Vaccine Bivalent Booster 143yr& up 07/06/2021   Pneumococcal Conjugate-13 12/17/2014   Pneumococcal Polysaccharide-23 07/25/2002, 08/26/2007, 09/01/2012   Respiratory Syncytial Virus Vaccine,Recomb Aduvanted(Arexvy) 09/15/2022   Td 07/25/2002   Tdap 11/23/2011, 04/30/2022   Zoster Recombinat (Shingrix) 06/29/2019, 09/27/2019   Zoster, Live 08/28/2008    TDAP status: Up to date  Flu Vaccine status: Up to date  Pneumococcal vaccine status: Up to date  Covid-19 vaccine status: Information provided on how to obtain vaccines.   Qualifies for Shingles Vaccine? Yes   Zostavax completed Yes   Shingrix Completed?: Yes  Screening Tests Health Maintenance  Topic Date Due   FOOT EXAM  03/12/2022   Medicare Annual Wellness (AWV)   08/05/2022   Diabetic kidney evaluation - Urine ACR  09/17/2022   COVID-19 Vaccine (7 - 2023-24 season) 11/10/2022   COLONOSCOPY (Pts [id number] coverage will need to be confirmed)  02/07/2023   HEMOGLOBIN A1C  04/01/2023   OPHTHALMOLOGY EXAM  09/15/2023   Diabetic kidney evaluation - eGFR measurement  10/01/2023   DTaP/Tdap/Td (4 - Td or Tdap) 04/30/2032   Pneumonia Vaccine 65+83ears old  Completed   INFLUENZA VACCINE  Completed   Hepatitis C Screening  Completed   Zoster Vaccines- Shingrix  Completed   HPV VACCINES  Aged Out    Health Maintenance  Health Maintenance Due  Topic Date Due   FOOT EXAM  03/12/2022   Medicare Annual Wellness (AWV)  08/05/2022   Diabetic kidney evaluation - Urine ACR  09/17/2022    Colorectal cancer screening: Type of screening: Colonoscopy. Completed 02/07/20. Repeat every 3 years  Lung Cancer Screening: (Low Dose CT Chest recommended if Age 84-56-80ars, 30 pack-year currently smoking OR have quit w/in 15years.) does not qualify.   Additional Screening:  Hepatitis C Screening: does qualify; Completed  06/17/15  Vision Screening: Recommended annual ophthalmology exams for early detection of glaucoma and other disorders of the eye. Is the patient up to date with their annual eye exam?  Yes  Who is the provider or what is the name of the office in which the patient attends annual eye exams? [facility] If pt is not established with a provider, would they like to be referred to a provider to establish care? No .   Dental Screening: Recommended annual dental exams for proper oral hygiene  Community Resource Referral / Chronic Care Management: CRR required this visit?  No   CCM required this visit?  No      Plan:     I have personally reviewed and noted the following in the patient's chart:   Medical and social history Use of alcohol, tobacco or illicit drugs  Current medications and supplements including opioid prescriptions.  Patient is not currently taking opioid prescriptions. Functional ability and status Nutritional status Physical activity Advanced directives List of other physicians Hospitalizations, surgeries, and ER visits in previous 12 months Vitals Screenings to include cognitive, depression, and falls Referrals and appointments  In addition, I have reviewed and discussed with patient certain preventive protocols, quality metrics, and best practice recommendations. A written personalized care plan for preventive services as well as general preventive health recommendations were provided to patient.   Due to this being a telephonic visit, the after visit summary with patients personalized plan was offered to patient via mail or my-chart. Patient would like to access on my-chart.  Beatris Ship, [place]   10/02/2022   Nurse Notes: None  I have reviewed and agree with [facility] documentation.  Kathlene November, MD

## 2022-10-02 NOTE — Patient Instructions (Signed)
Harry Baker , Thank you for taking time to come for your Medicare Wellness Visit. I appreciate your ongoing commitment to your health goals. Please review the following plan we discussed and let me know if I can assist you in the future.   These are the goals we discussed:  Goals      HEMOGLOBIN A1C < 7.0     Patient Stated     Lose some more weight-would like to reach 180        This is a list of the screening recommended for you and due dates:  Health Maintenance  Topic Date Due   Complete foot exam   03/12/2022   Yearly kidney health urinalysis for diabetes  09/17/2022   COVID-19 Vaccine (7 - 2023-24 season) 11/10/2022   Colon Cancer Screening  02/07/2023   Hemoglobin A1C  04/01/2023   Eye exam for diabetics  09/15/2023   Yearly kidney function blood test for diabetes  10/01/2023   Medicare Annual Wellness Visit  10/03/2023   DTaP/Tdap/Td vaccine (4 - Td or Tdap) 04/30/2032   Pneumonia Vaccine  Completed   Flu Shot  Completed   Hepatitis C Screening: USPSTF Recommendation to screen - Ages 44-79 yo.  Completed   Zoster (Shingles) Vaccine  Completed   HPV Vaccine  Aged Out     Next appointment: Follow up in one year for your annual wellness visit.   Preventive Care 97 Years and Older, Male Preventive care refers to lifestyle choices and visits with your health care provider that can promote health and wellness. What does preventive care include? A yearly physical exam. This is also called an annual well check. Dental exams once or twice a year. Routine eye exams. Ask your health care provider how often you should have your eyes checked. Personal lifestyle choices, including: Daily care of your teeth and gums. Regular physical activity. Eating a healthy diet. Avoiding tobacco and drug use. Limiting alcohol use. Practicing safe sex. Taking low doses of aspirin every day. Taking vitamin and mineral supplements as recommended by your health care provider. What happens  during an annual well check? The services and screenings done by your health care provider during your annual well check will depend on your age, overall health, lifestyle risk factors, and family history of disease. Counseling  Your health care provider may ask you questions about your: Alcohol use. Tobacco use. Drug use. Emotional well-being. Home and relationship well-being. Sexual activity. Eating habits. History of falls. Memory and ability to understand (cognition). Work and work Statistician. Screening  You may have the following tests or measurements: Height, weight, and BMI. Blood pressure. Lipid and cholesterol levels. These may be checked every 5 years, or more frequently if you are over 75 years old. Skin check. Lung cancer screening. You may have this screening every year starting at age 13 if you have a 30-pack-year history of smoking and currently smoke or have quit within the past 15 years. Fecal occult blood test (FOBT) of the stool. You may have this test every year starting at age 3. Flexible sigmoidoscopy or colonoscopy. You may have a sigmoidoscopy every 5 years or a colonoscopy every 10 years starting at age 29. Prostate cancer screening. Recommendations will vary depending on your family history and other risks. Hepatitis C blood test. Hepatitis B blood test. Sexually transmitted disease (STD) testing. Diabetes screening. This is done by checking your blood sugar (glucose) after you have not eaten for a while (fasting). You may have this  done every 1-3 years. Abdominal aortic aneurysm (AAA) screening. You may need this if you are a current or former smoker. Osteoporosis. You may be screened starting at age 79 if you are at high risk. Talk with your health care provider about your test results, treatment options, and if necessary, the need for more tests. Vaccines  Your health care provider may recommend certain vaccines, such as: Influenza vaccine. This is  recommended every year. Tetanus, diphtheria, and acellular pertussis (Tdap, Td) vaccine. You may need a Td booster every 10 years. Zoster vaccine. You may need this after age 85. Pneumococcal 13-valent conjugate (PCV13) vaccine. One dose is recommended after age 28. Pneumococcal polysaccharide (PPSV23) vaccine. One dose is recommended after age 37. Talk to your health care provider about which screenings and vaccines you need and how often you need them. This information is not intended to replace advice given to you by your health care provider. Make sure you discuss any questions you have with your health care provider. Document Released: 11/01/2015 Document Revised: 06/24/2016 Document Reviewed: 08/06/2015 Elsevier Interactive Patient Education  2017 [place] Prevention in the Home Falls can cause injuries. They can happen to people of all ages. There are many things you can do to make your home safe and to help prevent falls. What can I do on the outside of my home? Regularly fix the edges of walkways and driveways and fix any cracks. Remove anything that might make you trip as you walk through a door, such as a raised step or threshold. Trim any bushes or trees on the path to your home. Use bright outdoor lighting. Clear any walking paths of anything that might make someone trip, such as rocks or tools. Regularly check to see if handrails are loose or broken. Make sure that both sides of any steps have handrails. Any raised decks and porches should have guardrails on the edges. Have any leaves, snow, or ice cleared regularly. Use sand or salt on walking paths during winter. Clean up any spills in your garage right away. This includes oil or grease spills. What can I do in the bathroom? Use night lights. Install grab bars by the toilet and in the tub and shower. Do not use towel bars as grab bars. Use non-skid mats or decals in the tub or shower. If you need to sit down in  the shower, use a plastic, non-slip stool. Keep the floor dry. Clean up any water that spills on the floor as soon as it happens. Remove soap buildup in the tub or shower regularly. Attach bath mats securely with double-sided non-slip rug tape. Do not have throw rugs and other things on the floor that can make you trip. What can I do in the bedroom? Use night lights. Make sure that you have a light by your bed that is easy to reach. Do not use any sheets or blankets that are too big for your bed. They should not hang down onto the floor. Have a firm chair that has side arms. You can use this for support while you get dressed. Do not have throw rugs and other things on the floor that can make you trip. What can I do in the kitchen? Clean up any spills right away. Avoid walking on wet floors. Keep items that you use a lot in easy-to-reach places. If you need to reach something above you, use a strong step stool that has a grab bar. Keep electrical cords out of  the way. Do not use floor polish or wax that makes floors slippery. If you must use wax, use non-skid floor wax. Do not have throw rugs and other things on the floor that can make you trip. What can I do with my stairs? Do not leave any items on the stairs. Make sure that there are handrails on both sides of the stairs and use them. Fix handrails that are broken or loose. Make sure that handrails are as long as the stairways. Check any carpeting to make sure that it is firmly attached to the stairs. Fix any carpet that is loose or worn. Avoid having throw rugs at the top or bottom of the stairs. If you do have throw rugs, attach them to the floor with carpet tape. Make sure that you have a light switch at the top of the stairs and the bottom of the stairs. If you do not have them, ask someone to add them for you. What else can I do to help prevent falls? Wear shoes that: Do not have high heels. Have rubber bottoms. Are comfortable  and fit you well. Are closed at the toe. Do not wear sandals. If you use a stepladder: Make sure that it is fully opened. Do not climb a closed stepladder. Make sure that both sides of the stepladder are locked into place. Ask someone to hold it for you, if possible. Clearly mark and make sure that you can see: Any grab bars or handrails. First and last steps. Where the edge of each step is. Use tools that help you move around (mobility aids) if they are needed. These include: Canes. Walkers. Scooters. Crutches. Turn on the lights when you go into a dark area. Replace any light bulbs as soon as they burn out. Set up your furniture so you have a clear path. Avoid moving your furniture around. If any of your floors are uneven, fix them. If there are any pets around you, be aware of where they are. Review your medicines with your doctor. Some medicines can make you feel dizzy. This can increase your chance of falling. Ask your doctor what other things that you can do to help prevent falls. This information is not intended to replace advice given to you by your health care provider. Make sure you discuss any questions you have with your health care provider. Document Released: 08/01/2009 Document Revised: 03/12/2016 Document Reviewed: 11/09/2014 Elsevier Interactive Patient Education  2017 [employer].

## 2022-10-06 DIAGNOSIS — C61 Malignant neoplasm of prostate: Secondary | ICD-10-CM | POA: Diagnosis not present

## 2022-10-06 LAB — PSA: PSA: 0.043

## 2022-10-13 DIAGNOSIS — C61 Malignant neoplasm of prostate: Secondary | ICD-10-CM | POA: Diagnosis not present

## 2022-10-13 DIAGNOSIS — N5201 Erectile dysfunction due to arterial insufficiency: Secondary | ICD-10-CM | POA: Diagnosis not present

## 2022-10-16 ENCOUNTER — Encounter: Payer: Self-pay | Admitting: Internal Medicine

## 2023-01-06 DIAGNOSIS — M5116 Intervertebral disc disorders with radiculopathy, lumbar region: Secondary | ICD-10-CM | POA: Diagnosis not present

## 2023-01-06 DIAGNOSIS — M25552 Pain in left hip: Secondary | ICD-10-CM | POA: Diagnosis not present

## 2023-01-06 DIAGNOSIS — M9903 Segmental and somatic dysfunction of lumbar region: Secondary | ICD-10-CM | POA: Diagnosis not present

## 2023-01-06 DIAGNOSIS — M9905 Segmental and somatic dysfunction of pelvic region: Secondary | ICD-10-CM | POA: Diagnosis not present

## 2023-01-07 DIAGNOSIS — M9905 Segmental and somatic dysfunction of pelvic region: Secondary | ICD-10-CM | POA: Diagnosis not present

## 2023-01-07 DIAGNOSIS — M9903 Segmental and somatic dysfunction of lumbar region: Secondary | ICD-10-CM | POA: Diagnosis not present

## 2023-01-07 DIAGNOSIS — M25552 Pain in left hip: Secondary | ICD-10-CM | POA: Diagnosis not present

## 2023-01-07 DIAGNOSIS — M5116 Intervertebral disc disorders with radiculopathy, lumbar region: Secondary | ICD-10-CM | POA: Diagnosis not present

## 2023-01-11 DIAGNOSIS — M9903 Segmental and somatic dysfunction of lumbar region: Secondary | ICD-10-CM | POA: Diagnosis not present

## 2023-01-11 DIAGNOSIS — M25552 Pain in left hip: Secondary | ICD-10-CM | POA: Diagnosis not present

## 2023-01-11 DIAGNOSIS — M5116 Intervertebral disc disorders with radiculopathy, lumbar region: Secondary | ICD-10-CM | POA: Diagnosis not present

## 2023-01-11 DIAGNOSIS — M9905 Segmental and somatic dysfunction of pelvic region: Secondary | ICD-10-CM | POA: Diagnosis not present

## 2023-01-12 DIAGNOSIS — M25552 Pain in left hip: Secondary | ICD-10-CM | POA: Diagnosis not present

## 2023-01-12 DIAGNOSIS — M5116 Intervertebral disc disorders with radiculopathy, lumbar region: Secondary | ICD-10-CM | POA: Diagnosis not present

## 2023-01-12 DIAGNOSIS — M9903 Segmental and somatic dysfunction of lumbar region: Secondary | ICD-10-CM | POA: Diagnosis not present

## 2023-01-12 DIAGNOSIS — M9905 Segmental and somatic dysfunction of pelvic region: Secondary | ICD-10-CM | POA: Diagnosis not present

## 2023-01-14 DIAGNOSIS — M5116 Intervertebral disc disorders with radiculopathy, lumbar region: Secondary | ICD-10-CM | POA: Diagnosis not present

## 2023-01-14 DIAGNOSIS — M25552 Pain in left hip: Secondary | ICD-10-CM | POA: Diagnosis not present

## 2023-01-14 DIAGNOSIS — M9905 Segmental and somatic dysfunction of pelvic region: Secondary | ICD-10-CM | POA: Diagnosis not present

## 2023-01-14 DIAGNOSIS — M9903 Segmental and somatic dysfunction of lumbar region: Secondary | ICD-10-CM | POA: Diagnosis not present

## 2023-01-25 DIAGNOSIS — M25552 Pain in left hip: Secondary | ICD-10-CM | POA: Diagnosis not present

## 2023-01-25 DIAGNOSIS — M9905 Segmental and somatic dysfunction of pelvic region: Secondary | ICD-10-CM | POA: Diagnosis not present

## 2023-01-25 DIAGNOSIS — M5116 Intervertebral disc disorders with radiculopathy, lumbar region: Secondary | ICD-10-CM | POA: Diagnosis not present

## 2023-01-25 DIAGNOSIS — M9903 Segmental and somatic dysfunction of lumbar region: Secondary | ICD-10-CM | POA: Diagnosis not present

## 2023-01-26 DIAGNOSIS — M9905 Segmental and somatic dysfunction of pelvic region: Secondary | ICD-10-CM | POA: Diagnosis not present

## 2023-01-26 DIAGNOSIS — M5116 Intervertebral disc disorders with radiculopathy, lumbar region: Secondary | ICD-10-CM | POA: Diagnosis not present

## 2023-01-26 DIAGNOSIS — M9903 Segmental and somatic dysfunction of lumbar region: Secondary | ICD-10-CM | POA: Diagnosis not present

## 2023-01-26 DIAGNOSIS — M25552 Pain in left hip: Secondary | ICD-10-CM | POA: Diagnosis not present

## 2023-01-28 DIAGNOSIS — M25552 Pain in left hip: Secondary | ICD-10-CM | POA: Diagnosis not present

## 2023-01-28 DIAGNOSIS — M5116 Intervertebral disc disorders with radiculopathy, lumbar region: Secondary | ICD-10-CM | POA: Diagnosis not present

## 2023-01-28 DIAGNOSIS — M9905 Segmental and somatic dysfunction of pelvic region: Secondary | ICD-10-CM | POA: Diagnosis not present

## 2023-01-28 DIAGNOSIS — M9903 Segmental and somatic dysfunction of lumbar region: Secondary | ICD-10-CM | POA: Diagnosis not present

## 2023-02-02 DIAGNOSIS — M9905 Segmental and somatic dysfunction of pelvic region: Secondary | ICD-10-CM | POA: Diagnosis not present

## 2023-02-02 DIAGNOSIS — M9903 Segmental and somatic dysfunction of lumbar region: Secondary | ICD-10-CM | POA: Diagnosis not present

## 2023-02-02 DIAGNOSIS — M5116 Intervertebral disc disorders with radiculopathy, lumbar region: Secondary | ICD-10-CM | POA: Diagnosis not present

## 2023-02-02 DIAGNOSIS — M25552 Pain in left hip: Secondary | ICD-10-CM | POA: Diagnosis not present

## 2023-02-03 DIAGNOSIS — M9903 Segmental and somatic dysfunction of lumbar region: Secondary | ICD-10-CM | POA: Diagnosis not present

## 2023-02-03 DIAGNOSIS — M5116 Intervertebral disc disorders with radiculopathy, lumbar region: Secondary | ICD-10-CM | POA: Diagnosis not present

## 2023-02-03 DIAGNOSIS — M25552 Pain in left hip: Secondary | ICD-10-CM | POA: Diagnosis not present

## 2023-02-03 DIAGNOSIS — M9905 Segmental and somatic dysfunction of pelvic region: Secondary | ICD-10-CM | POA: Diagnosis not present

## 2023-02-04 DIAGNOSIS — M5116 Intervertebral disc disorders with radiculopathy, lumbar region: Secondary | ICD-10-CM | POA: Diagnosis not present

## 2023-02-04 DIAGNOSIS — M25552 Pain in left hip: Secondary | ICD-10-CM | POA: Diagnosis not present

## 2023-02-04 DIAGNOSIS — M9903 Segmental and somatic dysfunction of lumbar region: Secondary | ICD-10-CM | POA: Diagnosis not present

## 2023-02-04 DIAGNOSIS — M9905 Segmental and somatic dysfunction of pelvic region: Secondary | ICD-10-CM | POA: Diagnosis not present

## 2023-02-05 ENCOUNTER — Other Ambulatory Visit: Payer: Self-pay | Admitting: Internal Medicine

## 2023-02-05 ENCOUNTER — Other Ambulatory Visit: Payer: Self-pay | Admitting: Cardiology

## 2023-02-05 DIAGNOSIS — I2510 Atherosclerotic heart disease of native coronary artery without angina pectoris: Secondary | ICD-10-CM

## 2023-02-08 DIAGNOSIS — M5116 Intervertebral disc disorders with radiculopathy, lumbar region: Secondary | ICD-10-CM | POA: Diagnosis not present

## 2023-02-08 DIAGNOSIS — M9905 Segmental and somatic dysfunction of pelvic region: Secondary | ICD-10-CM | POA: Diagnosis not present

## 2023-02-08 DIAGNOSIS — M9903 Segmental and somatic dysfunction of lumbar region: Secondary | ICD-10-CM | POA: Diagnosis not present

## 2023-02-08 DIAGNOSIS — M25552 Pain in left hip: Secondary | ICD-10-CM | POA: Diagnosis not present

## 2023-02-09 DIAGNOSIS — M25552 Pain in left hip: Secondary | ICD-10-CM | POA: Diagnosis not present

## 2023-02-09 DIAGNOSIS — M5116 Intervertebral disc disorders with radiculopathy, lumbar region: Secondary | ICD-10-CM | POA: Diagnosis not present

## 2023-02-09 DIAGNOSIS — M9905 Segmental and somatic dysfunction of pelvic region: Secondary | ICD-10-CM | POA: Diagnosis not present

## 2023-02-09 DIAGNOSIS — M9903 Segmental and somatic dysfunction of lumbar region: Secondary | ICD-10-CM | POA: Diagnosis not present

## 2023-02-15 DIAGNOSIS — M9905 Segmental and somatic dysfunction of pelvic region: Secondary | ICD-10-CM | POA: Diagnosis not present

## 2023-02-15 DIAGNOSIS — M25552 Pain in left hip: Secondary | ICD-10-CM | POA: Diagnosis not present

## 2023-02-15 DIAGNOSIS — M9903 Segmental and somatic dysfunction of lumbar region: Secondary | ICD-10-CM | POA: Diagnosis not present

## 2023-02-15 DIAGNOSIS — M5116 Intervertebral disc disorders with radiculopathy, lumbar region: Secondary | ICD-10-CM | POA: Diagnosis not present

## 2023-03-04 ENCOUNTER — Encounter: Payer: Self-pay | Admitting: Internal Medicine

## 2023-03-04 DIAGNOSIS — M5116 Intervertebral disc disorders with radiculopathy, lumbar region: Secondary | ICD-10-CM | POA: Diagnosis not present

## 2023-03-04 DIAGNOSIS — M9903 Segmental and somatic dysfunction of lumbar region: Secondary | ICD-10-CM | POA: Diagnosis not present

## 2023-03-04 DIAGNOSIS — M9905 Segmental and somatic dysfunction of pelvic region: Secondary | ICD-10-CM | POA: Diagnosis not present

## 2023-03-04 DIAGNOSIS — M25552 Pain in left hip: Secondary | ICD-10-CM | POA: Diagnosis not present

## 2023-03-22 ENCOUNTER — Encounter: Payer: Self-pay | Admitting: Gastroenterology

## 2023-03-26 ENCOUNTER — Ambulatory Visit: Payer: Medicare Other

## 2023-03-26 ENCOUNTER — Telehealth: Payer: Self-pay | Admitting: *Deleted

## 2023-03-26 NOTE — Progress Notes (Unsigned)
Pt's name and DOB verified at the beginning of the pre-visit.  Pt denies any difficulty with ambulating,sitting, laying down or rolling side to side Gave both [facility] main # and MD on call # prior to instructions.  No egg or soy allergy known to patient  No issues known to pt with past sedation with any surgeries or procedures Pt denies having issues being intubated Patient denies ever being intubated Pt has no issues moving head neck or swallowing No FH of Malignant Hyperthermia Pt is not on diet pills Pt is not on home 02  Pt is not on blood thinners  Pt denies issues with constipation  Pt has frequent issues with constipation RN instructed pt to use Miralax per bottles instructions a week before prep days. Pt states they will Pt is not on dialysis Pt denise any abnormal heart rhythms  Pt denies any upcoming cardiac testing Pt encouraged to use to use Singlecare or Goodrx to reduce cost  Patient's chart reviewed by John Nulty CNRA prior to pre-visit and patient appropriate for the [facility].  Pre-visit completed and red dot placed by patient's name on their procedure day (on provider's schedule).  . Visit by phone Pt states weight is  Instructed pt why it is important to and  to call if they have any changes in health or new medications. Directed them to the # given and on instructions.   Pt states they will.  Instructions reviewed with pt and pt states understanding. Instructed to review again prior to procedure. Pt states they will.  Instructions sent by mail with coupon and by my chart   

## 2023-03-26 NOTE — Telephone Encounter (Signed)
Attempt to reach pt for pre-visit. LM with call back #.  Will attempt to reach again in 5 min due to no other # listed in profile Second attempt to reach pt for pre-vist unsuccessful. LM with facility # for pt to call back. Instructed pt to call # given by end of the day and reschedule the pre-visit or the scheduled procedure will be canceled.  RN will verify if pt has rescheduled pre-visit or not at end of day and if no per protocol Procedure will be canceled,

## 2023-04-02 ENCOUNTER — Ambulatory Visit (INDEPENDENT_AMBULATORY_CARE_PROVIDER_SITE_OTHER): Payer: Medicare Other | Admitting: Internal Medicine

## 2023-04-02 ENCOUNTER — Encounter: Payer: Self-pay | Admitting: Internal Medicine

## 2023-04-02 VITALS — BP 126/68 | HR 78 | Temp 98.3°F | Resp 16 | Ht 68.0 in | Wt 203.0 lb

## 2023-04-02 DIAGNOSIS — E7800 Pure hypercholesterolemia, unspecified: Secondary | ICD-10-CM | POA: Diagnosis not present

## 2023-04-02 DIAGNOSIS — E1159 Type 2 diabetes mellitus with other circulatory complications: Secondary | ICD-10-CM

## 2023-04-02 DIAGNOSIS — Z7984 Long term (current) use of oral hypoglycemic drugs: Secondary | ICD-10-CM | POA: Diagnosis not present

## 2023-04-02 DIAGNOSIS — I2510 Atherosclerotic heart disease of native coronary artery without angina pectoris: Secondary | ICD-10-CM

## 2023-04-02 LAB — CBC WITH DIFFERENTIAL/PLATELET
Basophils Absolute: 0 10*3/uL (ref 0.0–0.1)
Basophils Relative: 0.7 % (ref 0.0–3.0)
Eosinophils Absolute: 0.3 10*3/uL (ref 0.0–0.7)
Eosinophils Relative: 4 % (ref 0.0–5.0)
HCT: 41.5 % (ref 39.0–52.0)
Hemoglobin: 13.5 g/dL (ref 13.0–17.0)
Lymphocytes Relative: 40.9 % (ref 12.0–46.0)
Lymphs Abs: 2.7 10*3/uL (ref 0.7–4.0)
MCHC: 32.6 g/dL (ref 30.0–36.0)
MCV: 93.7 fl (ref 78.0–100.0)
Monocytes Absolute: 0.6 10*3/uL (ref 0.1–1.0)
Monocytes Relative: 8.6 % (ref 3.0–12.0)
Neutro Abs: 3.1 10*3/uL (ref 1.4–7.7)
Neutrophils Relative %: 45.8 % (ref 43.0–77.0)
Platelets: 230 10*3/uL (ref 150.0–400.0)
RBC: 4.43 Mil/uL (ref 4.22–5.81)
RDW: 14.5 % (ref 11.5–15.5)
WBC: 6.7 10*3/uL (ref 4.0–10.5)

## 2023-04-02 LAB — BASIC METABOLIC PANEL
BUN: 19 mg/dL (ref 6–23)
CO2: 30 mEq/L (ref 19–32)
Calcium: 9.4 mg/dL (ref 8.4–10.5)
Chloride: 104 mEq/L (ref 96–112)
Creatinine, Ser: 0.89 mg/dL (ref 0.40–1.50)
GFR: 83.41 mL/min (ref >60.00)
Glucose, Bld: 101 mg/dL — ABNORMAL HIGH (ref 70–99)
Potassium: 4.6 mEq/L (ref 3.5–5.1)
Sodium: 142 mEq/L (ref 135–145)

## 2023-04-02 LAB — MICROALBUMIN / CREATININE URINE RATIO
Creatinine,U: 148.6 mg/dL
Microalb Creat Ratio: 0.5 mg/g (ref 0.0–30.0)
Microalb, Ur: <0.7 mg/dL (ref 0.0–1.9)

## 2023-04-02 LAB — HEMOGLOBIN A1C: Hgb A1c MFr Bld: 6.7 % — ABNORMAL HIGH (ref 4.6–6.5)

## 2023-04-02 MED ORDER — NITROGLYCERIN 0.4 MG SL SUBL: 0.4000 mg | SUBLINGUAL_TABLET | SUBLINGUAL | 3 refills | Status: DC | PRN

## 2023-04-02 NOTE — Patient Instructions (Signed)
Recommend to refill and carry with you nitroglycerin just in case.  Vaccines to consider COVID booster at any time Flu shot this fall  Check the  blood pressure regularly BP GOAL is between 110/65 and  135/85. If it is consistently higher or lower, let me know    GO TO THE LAB : Get the blood work     GO TO THE FRONT DESK, PLEASE SCHEDULE YOUR APPOINTMENTS Come back for   a checkup December 2024

## 2023-04-02 NOTE — Progress Notes (Unsigned)
Subjective:    Patient ID: Harry Baker, male    DOB: July 08, 1946, 77 y.o.   MRN: [id number]  DOS:  04/02/2023 Type of visit - description: Follow-up  Chronic medical problems addressed. Having left hip pain, better. History of CAD, no chest pain, does not carry NTG with him. Wonders about a COVID booster  Review of Systems See above   Past Medical History:  Diagnosis Date   Arthritis    CAD (coronary artery disease)    s/p stent 6/08(-) myoview, 8/11 negative stress test   Diabetes mellitus    Diverticulosis of colon    Elevated PSA    ERECTILE DYSFUNCTION    GERD    Hemorrhoid    problems off and on   Herniated disc    sess chiropractor 2010   HOH (hard of hearing)    Hyperlipidemia    Prostate cancer (HCC)    Prostate cancer (HCC)    Vocal cord polyp     Past Surgical History:  Procedure Laterality Date   BIOPSY PROSTATE  02/15/2018   CARDIAC CATHETERIZATION  05/1999   stent   HEMORROIDECTOMY     MOUTH SURGERY  2010   TONSILLECTOMY     TRANSRECTAL ULTRASOUND  02/15/2018   VC cyst removal      Current Outpatient Medications  Medication Instructions   aspirin EC 81 mg, Oral, Daily   atorvastatin (LIPITOR) 80 MG tablet TAKE 1 TABLET BY MOUTH EVERY DAY   Biotin 2,500 mcg, Oral, Daily   Cholecalciferol (VITAMIN D-3) 25 MCG (1000 UT) CAPS Oral   co-enzyme Q-10 30 mg, Oral, Daily   ezetimibe (ZETIA) 10 MG tablet TAKE 1 TABLET BY MOUTH EVERY DAY   fish oil-omega-3 fatty acids 4 g, Oral, Daily   fluticasone (FLONASE) 50 MCG/ACT nasal spray 1-2 sprays, Each Nare, Daily   GLUCOSAMINE-CHONDROITIN PO 1 tablet, Daily   metFORMIN (GLUCOPHAGE) 850 mg, Oral, 3 times daily with meals   Multiple Vitamin (MULTIVITAMIN) capsule 1 capsule, Oral, Daily,     nitroGLYCERIN (NITROSTAT) 0.4 mg, Sublingual, Every 5 min x3 PRN   ramipril (ALTACE) 5 mg, Oral, Daily   zinc gluconate 50 mg, Oral, Daily       Objective:   Physical Exam BP 126/68   Pulse 78   Temp 98.3 F  (36.8 C) (Oral)   Resp 16   Ht 5\' 8"  (1.727 m)   Wt 203 lb (92.1 kg)   SpO2 97%   BMI 30.87 kg/m  General:   Well developed, NAD, BMI noted. HEENT:  Normocephalic . Face symmetric, atraumatic Lungs:  CTA B Normal respiratory effort, no intercostal retractions, no accessory muscle use. Heart: RRR,  no murmur.  DM foot exam: No edema, good pedal pulses, pinprick examination normal Skin: Not pale. Not jaundice Neurologic:  alert & oriented X3.  Speech normal, gait appropriate for age and unassisted Psych--  Cognition and judgment appear intact.  Cooperative with normal attention span and concentration.  Behavior appropriate. No anxious or depressed appearing.      Assessment     Assessment DM Hyperlipidemia CAD, stent 2008, negative stress test 2011 Prostate Cancer 01/2018, s/p XRT GERD, Nl EGD 2004 (done for hoarseness) ED [facility]  PLAN DM: On metformin, feet exam negative, check A1c and micro.  Further advised for results. CAD: Asymptomatic, does not carry NTG with him, explaining how it works, new Rx sent.  Check CBC and BMP. L hip pain: X-ray was negative, currently under chiropractor care and  getting better Preventive care: Booster COVID-vaccine is advised, flu shot this fall. RTC 09/2022 for checkup.   12-23 DM: on Metformin, no ambulatory CBGs, check A1c. Hyperlipidemia: On Lipitor, Zetia.  Check FLP. CAD: Asymptomatic, continue aspirin, statins, ramipril.  Checking labs. Rhinitis: Perennial rhinitis, worse when he is outdoors, on no medications, trial with Flonase, see AVS.  Call if not better, add Astepro?. Left hip pain: As described above, symptoms are mild, has not required any medication.  Due to H/o prostate cancer we will get a x-ray.  Patient will let me know if he is not gradually improving Change in bowel habits: See last office visit, labs okay, symptoms spontaneously resolved, decided not to pursue GI eval.  He plans to proceed with a colonoscopy in  April when he is due. Preventive care reviewed RTC 6 months

## 2023-04-03 NOTE — Assessment & Plan Note (Signed)
DM: On metformin, feet exam negative, check A1c and micro.  Further advised for results. CAD: Asx, does not carry NTG with him, explaining how NTG works, new Rx sent.  Check CBC and BMP. L hip pain: X-ray was negative, currently under chiropractor care and getting better Preventive care: Booster COVID-vaccine is advised, flu shot this fall. RTC 09/2022 for checkup.

## 2023-04-05 NOTE — Progress Notes (Signed)
HPI: FU coronary artery disease with prior PCI of his right coronary artery in August 2000. He also had residual LAD disease at that time. He has also had previous carotid Dopplers performed on September 24, 2005, which showed normal carotid arteries. His most recent Myoview in August of 2014 showed no ischemia or infarction and ejection fraction of 62%.  Abdominal ultrasound August 2019 showed no aneurysm.  Carotid Dopplers February 2023 showed no significant stenosis.  Since I last saw him, the patient denies any dyspnea on exertion, orthopnea, PND, pedal edema, palpitations, syncope or chest pain.   Current Outpatient Medications  Medication Sig Dispense Refill   aspirin EC 81 MG tablet Take 81 mg by mouth daily.      atorvastatin (LIPITOR) 80 MG tablet TAKE 1 TABLET BY MOUTH EVERY DAY 90 tablet 0   Biotin 2500 MCG CAPS Take 2,500 mcg by mouth daily.      Cholecalciferol (VITAMIN D-3) 25 MCG (1000 UT) CAPS Take by mouth.     co-enzyme Q-10 30 MG capsule Take 30 mg by mouth daily.     ezetimibe (ZETIA) 10 MG tablet TAKE 1 TABLET BY MOUTH EVERY DAY 90 tablet 0   fish oil-omega-3 fatty acids 1000 MG capsule Take 4 g by mouth daily.     fluticasone (FLONASE) 50 MCG/ACT nasal spray Place 1-2 sprays into both nostrils daily.     GLUCOSAMINE-CHONDROITIN PO Take 1 tablet by mouth daily.     metFORMIN (GLUCOPHAGE) 850 MG tablet Take 1 tablet (850 mg total) by mouth 3 (three) times daily with meals. 270 tablet 1   Multiple Vitamin (MULTIVITAMIN) capsule Take 1 capsule by mouth daily.     nitroGLYCERIN (NITROSTAT) 0.4 MG SL tablet Place 1 tablet (0.4 mg total) under the tongue every 5 (five) minutes x 3 doses as needed for chest pain. 25 tablet 3   ramipril (ALTACE) 5 MG capsule Take 1 capsule (5 mg total) by mouth daily. 90 capsule 1   zinc gluconate 50 MG tablet Take 50 mg by mouth daily.     No current facility-administered medications for this visit.     Past Medical History:  Diagnosis  Date   Arthritis    CAD (coronary artery disease)    s/p stent 6/08(-) myoview, 8/11 negative stress test   Diabetes mellitus    Diverticulosis of colon    Elevated PSA    ERECTILE DYSFUNCTION    GERD    Hemorrhoid    problems off and on   Herniated disc    sess chiropractor 2010   HOH (hard of hearing)    Hyperlipidemia    Prostate cancer (HCC)    Prostate cancer (HCC)    Vocal cord polyp     Past Surgical History:  Procedure Laterality Date   BIOPSY PROSTATE  02/15/2018   CARDIAC CATHETERIZATION  05/1999   stent   HEMORROIDECTOMY     MOUTH SURGERY  2010   TONSILLECTOMY     TRANSRECTAL ULTRASOUND  02/15/2018   VC cyst removal      Social History   Socioeconomic History   Marital status: Married    Spouse name: Not on file   Number of children: 1   Years of education: Not on file   Highest education level: Doctorate  Occupational History   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[employer]  Tobacco Use   Smoking status: Light Smoker    Types: Cigars  Smokeless tobacco: Never   Tobacco comments:    occ cigar  Vaping Use   Vaping Use: Never used  Substance and Sexual Activity   Alcohol use: Yes    Comment: wine socially   Drug use: No   Sexual activity: Not on file  Other Topics Concern   Not on file  Social History Narrative   Married, 1 step son   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    Social Determinants of Health   Financial Resource Strain: Low Risk  (03/26/2023)   Overall Financial Resource Strain (CARDIA)    Difficulty of Paying Living Expenses: Not hard at all  Food Insecurity: No Food Insecurity (03/26/2023)   Hunger Vital Sign    Worried About Running Out of Food in the Last Year: Never true    Ran Out of Food in the Last Year: Never true  Transportation Needs: No Transportation Needs (03/26/2023)   PRAPARE - Administrator, Civil Service (Medical): No    Lack of Transportation (Non-Medical): No  Physical Activity:  Insufficiently Active (03/26/2023)   Exercise Vital Sign    Days of Exercise per Week: 7 days    Minutes of Exercise per Session: 20 min  Stress: No Stress Concern Present (03/26/2023)   [employer] of Occupational Health - Occupational Stress Questionnaire    Feeling of Stress : Not at all  Social Connections: Socially Integrated (03/26/2023)   Social Connection and Isolation Panel [NHANES]    Frequency of Communication with Friends and Family: More than three times a week    Frequency of Social Gatherings with Friends and Family: Once a week    Attends Religious Services: 1 to 4 times per year    Active Member of [employer] or Organizations: Yes    Attends Engineer, structural: More than 4 times per year    Marital Status: Married  Catering manager Violence: Not At Risk (08/05/2021)   Humiliation, Afraid, Rape, and Kick questionnaire    Fear of Current or Ex-Partner: No    Emotionally Abused: No    Physically Abused: No    Sexually Abused: No    Family History  Problem Relation Age of Onset   Heart failure Mother    Stroke Father    Prostate cancer Other        uncle   Lung cancer Brother    Colon cancer Other        great aunt   Heart attack Other        GF   Diabetes Sister    Esophageal cancer Neg Hx    Stomach cancer Neg Hx     ROS: no fevers or chills, productive cough, hemoptysis, dysphasia, odynophagia, melena, hematochezia, dysuria, hematuria, rash, seizure activity, orthopnea, PND, pedal edema, claudication. Remaining systems are negative.  Physical Exam: Well-developed well-nourished in no acute distress.  Skin is warm and dry.  HEENT is normal.  Neck is supple.  Chest is clear to auscultation with normal expansion.  Cardiovascular exam is regular rate and rhythm.  Abdominal exam nontender or distended. No masses palpated. Extremities show no edema. neuro grossly intact  ECG-normal sinus rhythm at a rate of 78, no ST changes.  Personally  reviewed  A/P  1 coronary artery disease-patient denies chest pain.  Continue aspirin and statin.  2 hyperlipidemia-continue Lipitor and Zetia.  Olga Millers, MD

## 2023-04-13 ENCOUNTER — Encounter: Payer: Medicare Other | Admitting: Gastroenterology

## 2023-04-14 ENCOUNTER — Ambulatory Visit (AMBULATORY_SURGERY_CENTER): Payer: Medicare Other

## 2023-04-14 ENCOUNTER — Encounter: Payer: Self-pay | Admitting: Cardiology

## 2023-04-14 ENCOUNTER — Ambulatory Visit: Payer: Medicare Other | Attending: Cardiology | Admitting: Cardiology

## 2023-04-14 VITALS — BP 118/78 | HR 78 | Ht 68.0 in | Wt 200.8 lb

## 2023-04-14 VITALS — Ht 68.0 in | Wt 200.0 lb

## 2023-04-14 DIAGNOSIS — I2510 Atherosclerotic heart disease of native coronary artery without angina pectoris: Secondary | ICD-10-CM | POA: Insufficient documentation

## 2023-04-14 DIAGNOSIS — E7800 Pure hypercholesterolemia, unspecified: Secondary | ICD-10-CM | POA: Insufficient documentation

## 2023-04-14 DIAGNOSIS — Z86010 Personal history of colonic polyps: Secondary | ICD-10-CM

## 2023-04-14 MED ORDER — PEG 3350-KCL-NA BICARB-NACL 420 G PO SOLR
4000.0000 mL | Freq: Once | ORAL | 0 refills | Status: AC
Start: 2023-04-14 — End: 2023-04-14

## 2023-04-14 NOTE — Patient Instructions (Signed)
    Follow-Up: At [facility], you and your health needs are our priority.  As part of our continuing mission to provide you with exceptional heart care, we have created designated Provider Care Teams.  These Care Teams include your primary Cardiologist (physician) and Advanced Practice Providers (APPs -  Physician Assistants and Nurse Practitioners) who all work together to provide you with the care you need, when you need it.  We recommend signing up for the patient portal called "MyChart".  Sign up information is provided on this After Visit Summary.  MyChart is used to connect with patients for Virtual Visits (Telemedicine).  Patients are able to view lab/test results, encounter notes, upcoming appointments, etc.  Non-urgent messages can be sent to your provider as well.   To learn more about what you can do with MyChart, go to [email or web address].    Your next appointment:   12 month(s)  Provider:   Brian Crenshaw, MD     

## 2023-04-14 NOTE — Progress Notes (Signed)
No egg or soy allergy known to patient  No issues known to pt with past sedation with any surgeries or procedures Patient denies ever being told they had issues or difficulty with intubation  No FH of Malignant Hyperthermia Pt is not on diet pills Pt is not on  home 02  Pt is not on blood thinners  Pt denies issues with constipation  No A fib or A flutter Have any cardiac testing pending-- no  LOA: independent  Prep: Golytely extra miralax x 7 days    PV competed with patient. Prep instructions sent via mychart and home address.

## 2023-04-19 ENCOUNTER — Other Ambulatory Visit: Payer: Self-pay | Admitting: Cardiology

## 2023-04-19 DIAGNOSIS — I2510 Atherosclerotic heart disease of native coronary artery without angina pectoris: Secondary | ICD-10-CM

## 2023-05-11 DIAGNOSIS — L578 Other skin changes due to chronic exposure to nonionizing radiation: Secondary | ICD-10-CM | POA: Diagnosis not present

## 2023-05-11 DIAGNOSIS — B078 Other viral warts: Secondary | ICD-10-CM | POA: Diagnosis not present

## 2023-05-11 DIAGNOSIS — L814 Other melanin hyperpigmentation: Secondary | ICD-10-CM | POA: Diagnosis not present

## 2023-05-11 DIAGNOSIS — L570 Actinic keratosis: Secondary | ICD-10-CM | POA: Diagnosis not present

## 2023-05-11 DIAGNOSIS — L820 Inflamed seborrheic keratosis: Secondary | ICD-10-CM | POA: Diagnosis not present

## 2023-05-11 DIAGNOSIS — Z85828 Personal history of other malignant neoplasm of skin: Secondary | ICD-10-CM | POA: Diagnosis not present

## 2023-05-11 DIAGNOSIS — D1801 Hemangioma of skin and subcutaneous tissue: Secondary | ICD-10-CM | POA: Diagnosis not present

## 2023-05-11 DIAGNOSIS — L821 Other seborrheic keratosis: Secondary | ICD-10-CM | POA: Diagnosis not present

## 2023-05-12 ENCOUNTER — Encounter: Payer: Self-pay | Admitting: Gastroenterology

## 2023-05-12 ENCOUNTER — Ambulatory Visit (AMBULATORY_SURGERY_CENTER): Payer: Medicare Other | Admitting: Gastroenterology

## 2023-05-12 VITALS — BP 106/54 | HR 65 | Temp 97.5°F | Resp 10

## 2023-05-12 DIAGNOSIS — Z860100 Personal history of colon polyps, unspecified: Secondary | ICD-10-CM

## 2023-05-12 DIAGNOSIS — K5140 Inflammatory polyps of colon without complications: Secondary | ICD-10-CM | POA: Diagnosis not present

## 2023-05-12 DIAGNOSIS — K5730 Diverticulosis of large intestine without perforation or abscess without bleeding: Secondary | ICD-10-CM | POA: Diagnosis not present

## 2023-05-12 DIAGNOSIS — D123 Benign neoplasm of transverse colon: Secondary | ICD-10-CM

## 2023-05-12 DIAGNOSIS — Z09 Encounter for follow-up examination after completed treatment for conditions other than malignant neoplasm: Secondary | ICD-10-CM | POA: Diagnosis not present

## 2023-05-12 DIAGNOSIS — E119 Type 2 diabetes mellitus without complications: Secondary | ICD-10-CM | POA: Diagnosis not present

## 2023-05-12 DIAGNOSIS — D125 Benign neoplasm of sigmoid colon: Secondary | ICD-10-CM | POA: Diagnosis not present

## 2023-05-12 DIAGNOSIS — K641 Second degree hemorrhoids: Secondary | ICD-10-CM

## 2023-05-12 DIAGNOSIS — I2510 Atherosclerotic heart disease of native coronary artery without angina pectoris: Secondary | ICD-10-CM | POA: Diagnosis not present

## 2023-05-12 MED ORDER — SODIUM CHLORIDE 0.9 % IV SOLN
500.0000 mL | Freq: Once | INTRAVENOUS | Status: DC
Start: 2023-05-12 — End: 2023-05-12

## 2023-05-12 NOTE — Progress Notes (Signed)
Vss nad trans to pacu 

## 2023-05-12 NOTE — Progress Notes (Signed)
Called to room to assist during endoscopic procedure.  Patient ID and intended procedure confirmed with present staff. Received instructions for my participation in the procedure from the performing physician.  

## 2023-05-12 NOTE — Progress Notes (Signed)
GASTROENTEROLOGY PROCEDURE H&P NOTE   Primary Care Physician: Wanda Plump, MD    Reason for Procedure:  Colon Cancer screening, colon polyp surveillance  Plan:    Colonoscopy  Patient is appropriate for endoscopic procedure(s) in the ambulatory ([facility]) setting.  The nature of the procedure, as well as the risks, benefits, and alternatives were carefully and thoroughly reviewed with the patient. Ample time for discussion and questions allowed. The patient understood, was satisfied, and agreed to proceed.     HPI: Harry Baker is a 77 y.o. male who presents for colonoscopy for ongoing colon polyp surveillance.  No active GI symptoms.  No known family history of colon cancer or related malignancy.  Patient is otherwise without complaints or active issues today.  Last colonoscopy was 01/2020 and notable for 3 subcentimeter adenomas in the sigmoid colon and rectum, along with sigmoid diverticulosis, internal hemorrhoids, and a 5 mm rectal lipoma.  He presents today for ongoing surveillance.  Past Medical History:  Diagnosis Date   Arthritis    CAD (coronary artery disease)    s/p stent 6/08(-) myoview, 8/11 negative stress test   Diabetes mellitus    Diverticulosis of colon    Elevated PSA    ERECTILE DYSFUNCTION    GERD    Hemorrhoid    problems off and on   Herniated disc    sess chiropractor 2010   HOH (hard of hearing)    Hyperlipidemia    Prostate cancer [facility])    Prostate cancer [facility])    Vocal cord polyp     Past Surgical History:  Procedure Laterality Date   BIOPSY PROSTATE  02/15/2018   CARDIAC CATHETERIZATION  05/1999   stent   HEMORROIDECTOMY     MOUTH SURGERY  2010   TONSILLECTOMY     TRANSRECTAL ULTRASOUND  02/15/2018   VC cyst removal      Prior to Admission medications   Medication Sig Start Date End Date Taking? Authorizing Provider  aspirin EC 81 MG tablet Take 81 mg by mouth daily.     [provider]  atorvastatin (LIPITOR) 80 MG  tablet Take 1 tablet (80 mg total) by mouth daily. 04/19/23   Lewayne Bunting, MD  Biotin 2500 MCG CAPS Take 2,500 mcg by mouth daily.  06/20/17   [provider]  clobetasol ointment (TEMOVATE) 0.05 % Apply 1 Application topically as needed (poison ivy). Patient not taking: Reported on 04/14/2023 07/12/16   [provider]  co-enzyme Q-10 30 MG capsule Take 30 mg by mouth daily.    [provider]  Ergocalciferol 10 MCG (400 UNIT) TABS Take 10 mcg by mouth daily. 12/28/19   [provider]  ezetimibe (ZETIA) 10 MG tablet TAKE 1 TABLET BY MOUTH EVERY DAY 04/19/23   Lewayne Bunting, MD  fish oil-omega-3 fatty acids 1000 MG capsule Take 4 g by mouth daily.    [provider]  fluticasone (FLONASE) 50 MCG/ACT nasal spray Place 1-2 sprays into both nostrils daily.    [provider]  GLUCOSAMINE-CHONDROITIN PO Take 1 tablet by mouth daily.    [provider]  metFORMIN (GLUCOPHAGE) 850 MG tablet Take 1 tablet (850 mg total) by mouth 3 (three) times daily with meals. 02/05/23   Wanda Plump, MD  Multiple Vitamin (MULTIVITAMIN) capsule Take 1 capsule by mouth daily. Patient not taking: Reported on 04/14/2023    [provider]  Multiple Vitamins-Minerals (PRESERVISION AREDS 2+MULTI VIT PO)  09/09/22  [provider]  nitroGLYCERIN (NITROSTAT) 0.4 MG SL tablet Place 1 tablet (0.4 mg total) under the tongue every 5 (five) minutes x 3 doses as needed for chest pain. 04/02/23   Wanda Plump, MD  omega-3 acid ethyl esters (LOVAZA) 1 g capsule Take 1 g by mouth daily. 08/07/16   [provider]  ramipril (ALTACE) 5 MG capsule Take 1 capsule (5 mg total) by mouth daily. 02/05/23   Wanda Plump, MD  zinc gluconate 50 MG tablet Take 50 mg by mouth daily.    [provider]    Current Outpatient Medications  Medication Sig Dispense Refill   aspirin EC 81 MG tablet Take 81 mg by mouth daily.      atorvastatin (LIPITOR) 80  MG tablet Take 1 tablet (80 mg total) by mouth daily. 90 tablet 3   Biotin 2500 MCG CAPS Take 2,500 mcg by mouth daily.      clobetasol ointment (TEMOVATE) 0.05 % Apply 1 Application topically as needed (poison ivy). (Patient not taking: Reported on 04/14/2023)     co-enzyme Q-10 30 MG capsule Take 30 mg by mouth daily.     Ergocalciferol 10 MCG (400 UNIT) TABS Take 10 mcg by mouth daily.     ezetimibe (ZETIA) 10 MG tablet TAKE 1 TABLET BY MOUTH EVERY DAY 90 tablet 3   fish oil-omega-3 fatty acids 1000 MG capsule Take 4 g by mouth daily.     fluticasone (FLONASE) 50 MCG/ACT nasal spray Place 1-2 sprays into both nostrils daily.     GLUCOSAMINE-CHONDROITIN PO Take 1 tablet by mouth daily.     metFORMIN (GLUCOPHAGE) 850 MG tablet Take 1 tablet (850 mg total) by mouth 3 (three) times daily with meals. 270 tablet 1   Multiple Vitamin (MULTIVITAMIN) capsule Take 1 capsule by mouth daily. (Patient not taking: Reported on 04/14/2023)     Multiple Vitamins-Minerals (PRESERVISION AREDS 2+MULTI VIT PO)      nitroGLYCERIN (NITROSTAT) 0.4 MG SL tablet Place 1 tablet (0.4 mg total) under the tongue every 5 (five) minutes x 3 doses as needed for chest pain. 25 tablet 3   omega-3 acid ethyl esters (LOVAZA) 1 g capsule Take 1 g by mouth daily.     ramipril (ALTACE) 5 MG capsule Take 1 capsule (5 mg total) by mouth daily. 90 capsule 1   zinc gluconate 50 MG tablet Take 50 mg by mouth daily.     No current facility-administered medications for this visit.    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[employer]  Tobacco Use   Smoking status: Light Smoker    Types: Cigars  Smokeless tobacco: Never   Tobacco comments:    occ cigar  Vaping Use   Vaping status: Never Used  Substance and Sexual Activity   Alcohol use: Yes    Comment: wine socially   Drug use: No   Sexual activity: Not on file  Other Topics Concern   Not on file  Social History Narrative   Married, 1 step son   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    Social Determinants of Health   Financial Resource Strain: Low Risk  (03/26/2023)   Overall Financial Resource Strain (CARDIA)    Difficulty of Paying Living Expenses: Not hard at all  Food Insecurity: No Food Insecurity (03/26/2023)   Hunger Vital Sign    Worried About Running Out of Food in the Last Year: Never true    Ran Out of Food in the Last Year: Never true  Transportation Needs: No Transportation Needs (03/26/2023)   PRAPARE - Administrator, Civil Service (Medical): No    Lack of Transportation (Non-Medical): No  Physical Activity: Insufficiently Active (03/26/2023)   Exercise Vital Sign    Days of Exercise per Week: 7 days    Minutes of Exercise per Session: 20 min  Stress: No Stress Concern Present (03/26/2023)   [employer] of Occupational Health - Occupational Stress Questionnaire    Feeling of Stress : Not at all  Social Connections: Socially Integrated (03/26/2023)   Social Connection and Isolation Panel [NHANES]    Frequency of Communication with Friends and Family: More than three times a week    Frequency of Social Gatherings with Friends and Family: Once a week    Attends Religious Services: 1 to 4 times per year    Active Member of [employer] or Organizations: Yes    Attends Engineer, structural: More than 4 times per year    Marital Status: Married   Catering manager Violence: Not At Risk (08/05/2021)   Humiliation, Afraid, Rape, and Kick questionnaire    Fear of Current or Ex-Partner: No    Emotionally Abused: No    Physically Abused: No    Sexually Abused: No    Physical Exam: Vital signs in last 24 hours: @There  were no vitals taken for this visit. GEN: NAD EYE: Sclerae anicteric ENT: MMM CV: Non-tachycardic Pulm: CTA b/l GI: Soft, NT/ND NEURO:  Alert & Oriented x 3   Doristine Locks, DO [facility]   05/12/2023 10:07 AM

## 2023-05-12 NOTE — Op Note (Signed)
[facility] Patient Name: Harry Baker Procedure Date: 05/12/2023 10:45 AM MRN: [id number] Endoscopist: Doristine Locks , MD, [id number] Age: 77 Referring MD:  Date of Birth: 04/17/1946 Gender: Male Account #: [id number] Procedure:                Colonoscopy Indications:              Surveillance: Personal history of adenomatous                            polyps on last colonoscopy 3 years ago                           Last colonoscopy was 01/2020 and notable for 3                            subcentimeter adenomas in the sigmoid colon and                            rectum, along with sigmoid diverticulosis, internal                            hemorrhoids, and a 5 mm rectal lipoma. He presents                            today for ongoing surveillance.                           Separately, history of hemorrhoids with previous                            intervention, but recurrence of hemorrhoidal                            symptoms. Medicines:                Monitored Anesthesia Care Procedure:                Pre-Anesthesia Assessment:                           - Prior to the procedure, a History and Physical                            was performed, and patient medications and                            allergies were reviewed. The patient's tolerance of                            previous anesthesia was also reviewed. The risks                            and benefits of the procedure and the sedation                            options  and risks were discussed with the patient.                            All questions were answered, and informed consent                            was obtained. Prior Anticoagulants: The patient has                            taken no anticoagulant or antiplatelet agents. ASA                            Grade Assessment: II - A patient with mild systemic                            disease. After reviewing the risks and benefits,                             the patient was deemed in satisfactory condition to                            undergo the procedure.                           After obtaining informed consent, the colonoscope                            was passed under direct vision. Throughout the                            procedure, the patient's blood pressure, pulse, and                            oxygen saturations were monitored continuously. The                            Olympus CF-HQ190L ([id number]) Colonoscope was                            introduced through the anus and advanced to the the                            cecum, identified by appendiceal orifice and                            ileocecal valve. The colonoscopy was performed                            without difficulty. The patient tolerated the                            procedure well. The quality of the bowel  preparation was good. The ileocecal valve,                            appendiceal orifice, and rectum were photographed. Scope In: 10:55:07 AM Scope Out: 11:09:14 AM Scope Withdrawal Time: 0 hours 11 minutes 54 seconds  Total Procedure Duration: 0 hours 14 minutes 7 seconds  Findings:                 Skin tags were found on perianal exam.                           A 5 mm polyp was found in the transverse colon. The                            polyp was sessile. The polyp was removed with a                            cold snare. Resection and retrieval were complete.                            Estimated blood loss was minimal.                           A 2 mm polyp was found in the distal sigmoid colon.                            The polyp was sessile. The polyp was removed with a                            cold snare. Resection and retrieval were complete.                            Estimated blood loss was minimal.                           Multiple large-mouthed and small-mouthed                            diverticula  were found in the sigmoid colon.                           Non-bleeding internal hemorrhoids were found during                            retroflexion. The hemorrhoids were small and Grade                            II (internal hemorrhoids that prolapse but reduce                            spontaneously). Complications:            No immediate complications. Estimated Blood Loss:     Estimated blood loss was minimal. Impression:               -  Perianal skin tags found on perianal exam.                           - One 5 mm polyp in the transverse colon, removed                            with a cold snare. Resected and retrieved.                           - One 2 mm polyp in the distal sigmoid colon,                            removed with a cold snare. Resected and retrieved.                           - Diverticulosis in the sigmoid colon.                           - Non-bleeding internal hemorrhoids. Recommendation:           - Patient has a contact number available for                            emergencies. The signs and symptoms of potential                            delayed complications were discussed with the                            patient. Return to normal activities tomorrow.                            Written discharge instructions were provided to the                            patient.                           - Resume previous diet.                           - Continue present medications.                           - Await pathology results.                           - Repeat colonoscopy for surveillance based on                            pathology results.                           - Return to GI clinic PRN.                           -  Internal hemorrhoids were noted on this study and                            may be amenable to hemorrhoid band ligation. If you                            are interested in further treatment of these                             hemorrhoids with band ligation, please contact my                            clinic to set up an appointment for evaluation and                            treatment. Doristine Locks, MD 05/12/2023 11:14:33 AM

## 2023-05-12 NOTE — Patient Instructions (Signed)

## 2023-05-12 NOTE — Progress Notes (Signed)
Pt's states no medical or surgical changes since previsit or office visit. 

## 2023-05-13 ENCOUNTER — Telehealth: Payer: Self-pay

## 2023-05-13 NOTE — Telephone Encounter (Signed)
Follow up call to pt, no answer. 

## 2023-05-14 ENCOUNTER — Telehealth: Payer: Self-pay

## 2023-05-14 NOTE — Telephone Encounter (Signed)
Scheduled 3 Hemorrhoid banding appointments with patient on 9/24, 10/31, and 12/5 at 11:20 AM.

## 2023-05-14 NOTE — Telephone Encounter (Signed)
-----   Message from Harry Baker sent at 05/12/2023 11:59 AM EDT ----- Can we please reach out to this patient later this week to see if he would like to schedule for hemorrhoid banding? Thanks.

## 2023-06-23 DIAGNOSIS — Z23 Encounter for immunization: Secondary | ICD-10-CM | POA: Diagnosis not present

## 2023-07-13 ENCOUNTER — Encounter: Payer: Self-pay | Admitting: Gastroenterology

## 2023-07-13 ENCOUNTER — Ambulatory Visit (INDEPENDENT_AMBULATORY_CARE_PROVIDER_SITE_OTHER): Payer: Medicare Other | Admitting: Gastroenterology

## 2023-07-13 VITALS — BP 112/80 | HR 89 | Ht 68.0 in | Wt 195.0 lb

## 2023-07-13 DIAGNOSIS — K641 Second degree hemorrhoids: Secondary | ICD-10-CM

## 2023-07-13 DIAGNOSIS — K649 Unspecified hemorrhoids: Secondary | ICD-10-CM

## 2023-07-13 NOTE — Progress Notes (Signed)
Chief Complaint:    Symptomatic Internal Hemorrhoids; Hemorrhoid Band Ligation  GI History: 77 year old male with history of CAD s/p stent, diabetes, HLD, prostate CA  s/p external beam radiation 2019, follows in the GI clinic for the following:  1) History of colon polyps  2) Symptomatic hemorrhoids.  Has had hemorrhoidectomy and thermal therapy in the past.  Return of hemorrhoidal symptoms over the last few years with intermittent BRBPR.  3) GERD.  Index symptoms of regurgitation and heartburn.  No dysphagia.  Currently well-controlled.  Endoscopic History: - EGD (2004): Normal - Colonoscopy (2011): 5 mm inflammatory polyp in transverse colon, diverticulosis, hemorrhoids.  Recommended repeat in 10 years - 01/2020: Colonoscopy: 3 subcentimeter adenomas in the sigmoid colon and rectum, along with sigmoid diverticulosis, internal hemorrhoids, and a 5 mm rectal lipoma - 04/2023: Colonoscopy: 5 mm transverse colon adenoma, 2 mm distal sigmoid polyp (benign inflammatory polyp), sigmoid diverticulosis, small grade 2 internal hemorrhoids.  Recommended repeat in 5 years  HPI:     Patient is a 77 y.o. malewith a history of symptomatic internal hemorrhoids presenting to the Gastroenterology Clinic for follow-up and ongoing treatment. The patient presents with symptomatic grade 2 hemorrhoids, unresponsive to maximal medical therapy, requesting rubber band ligation of symptomatic hemorrhoidal disease.  No change in medical or surgical history, medications, allergies, social history since last appointment with me.   Review of systems:     No chest pain, no SOB, no fevers, no urinary sx   Past Medical History:  Diagnosis Date   Arthritis    CAD (coronary artery disease)    s/p stent 6/08(-) myoview, 8/11 negative stress test   Diabetes mellitus    Diverticulosis of colon    Elevated PSA    ERECTILE DYSFUNCTION    GERD    Hemorrhoid    problems off and on   Herniated disc    sess  chiropractor 2010   HOH (hard of hearing)    Hyperlipidemia    Prostate cancer (HCC)    Prostate cancer (HCC)    Vocal cord polyp     Patient's surgical history, family medical history, social history, medications and allergies were all reviewed in Epic    Current Outpatient Medications  Medication Sig Dispense Refill   aspirin EC 81 MG tablet Take 81 mg by mouth daily.      atorvastatin (LIPITOR) 80 MG tablet Take 1 tablet (80 mg total) by mouth daily. 90 tablet 3   Biotin 2500 MCG CAPS Take 2,500 mcg by mouth daily.      cholecalciferol (VITAMIN D3) 10 MCG/ML LIQD oral liquid Take 400 Units by mouth daily.     clobetasol ointment (TEMOVATE) 0.05 % Apply 1 Application topically as needed (poison ivy).     co-enzyme Q-10 30 MG capsule Take 30 mg by mouth daily.     ezetimibe (ZETIA) 10 MG tablet TAKE 1 TABLET BY MOUTH EVERY DAY 90 tablet 3   fish oil-omega-3 fatty acids 1000 MG capsule Take 4 g by mouth daily.     fluticasone (FLONASE) 50 MCG/ACT nasal spray Place 1-2 sprays into both nostrils daily.     GLUCOSAMINE-CHONDROITIN PO Take 1 tablet by mouth daily.     metFORMIN (GLUCOPHAGE) 850 MG tablet Take 1 tablet (850 mg total) by mouth 3 (three) times daily with meals. 270 tablet 1   Multiple Vitamin (MULTIVITAMIN) capsule Take 1 capsule by mouth daily.     Multiple Vitamins-Minerals (PRESERVISION AREDS 2+MULTI VIT PO)  nitroGLYCERIN (NITROSTAT) 0.4 MG SL tablet Place 1 tablet (0.4 mg total) under the tongue every 5 (five) minutes x 3 doses as needed for chest pain. 25 tablet 3   ramipril (ALTACE) 5 MG capsule Take 1 capsule (5 mg total) by mouth daily. 90 capsule 1   zinc gluconate 50 MG tablet Take 50 mg by mouth daily.     Ergocalciferol 10 MCG (400 UNIT) TABS Take 10 mcg by mouth daily.     omega-3 acid ethyl esters (LOVAZA) 1 g capsule Take 1 g by mouth daily.     No current facility-administered medications for this visit.    Physical Exam:     BP 112/80   Pulse 89    Ht 5\' 8"  (1.727 m)   Wt 195 lb (88.5 kg)   BMI 29.65 kg/m   GENERAL:  Pleasant male in NAD PSYCH: : Cooperative, normal affect NEURO: Alert and oriented x 3, no focal neurologic deficits Rectal exam: Sensation intact and preserved anal wink.  Grade 2 hemorrhoids noted in all positions on anoscopy.  No external anal fissures noted. Normal sphincter tone. No palpable mass. No blood on the exam glove. (Chaperone: Alberteen Sam, CMA).   IMPRESSION and PLAN:    #1.  Symptomatic internal hemorrhoids: PROCEDURE NOTE: The patient presents with symptomatic grade 2 hemorrhoids, unresponsive to maximal medical therapy, requesting rubber band ligation of symptomatic hemorrhoidal disease.  All risks, benefits and alternative forms of therapy were described and informed consent was obtained.  In the Left Lateral Decubitus position, anoscopic examination revealed grade 2 hemorrhoids in the all position(s).  The anorectum was pre-medicated with RectiCare. The decision was made to band the RP internal hemorrhoid, and the [facility] O'Regan System was used to perform band ligation without complication.  Digital anorectal examination was then performed to assure proper positioning of the band, and to adjust the banded tissue as required.  The patient was discharged home without pain or other issues.  Dietary and behavioral recommendations were given and along with follow-up instructions.     The following adjunctive treatments were recommended:  -Resume high-fiber diet with fiber supplement (i.e. Citrucel or Benefiber) with goal for soft stools without straining to have a BM. -Resume adequate fluid intake.  The patient will return in 4 for follow-up and possible additional banding as required. No complications were encountered and the patient tolerated the procedure well.      #2.  History of colon polyps - Repeat colonoscopy in 2029 for ongoing polyp surveillance      Shellia Cleverly ,DO, FACG  07/13/2023, 11:29 AM

## 2023-07-13 NOTE — Patient Instructions (Addendum)
_______________________________________________________  If your blood pressure at your visit was 140/90 or greater, please contact your primary care physician to follow up on this.  _______________________________________________________  If you are age 76 or older, your body mass index should be between 23-30. Your Body mass index is 29.65 kg/m. If this is out of the aforementioned range listed, please consider follow up with your Primary Care Provider.  If you are age 85 or younger, your body mass index should be between 19-25. Your Body mass index is 29.65 kg/m. If this is out of the aformentioned range listed, please consider follow up with your Primary Care Provider.   ________________________________________________________  The [facility] providers would like to encourage you to use [facility] to communicate with providers for non-urgent requests or questions.  Due to long hold times on the telephone, sending your provider a message by [facility] may be a faster and more efficient way to get a response.  Please allow 48 business hours for a response.  Please remember that this is for non-urgent requests.  _______________________________________________________  Bonita Quin have been scheduled for an appointment with Dr.Cirigliano on 08-19-2023 at 1120am . Please arrive 10 minutes early for your appointment.  HEMORRHOID BANDING PROCEDURE    FOLLOW-UP CARE   The procedure you have had should have been relatively painless since the banding of the area involved does not have nerve endings and there is no pain sensation.  The rubber band cuts off the blood supply to the hemorrhoid and the band may fall off as soon as 48 hours after the banding (the band may occasionally be seen in the toilet bowl following a bowel movement). You may notice a temporary feeling of fullness in the rectum which should respond adequately to plain Tylenol or Motrin.  Following the banding, avoid strenuous exercise that  evening and resume full activity the next day.  A sitz bath (soaking in a warm tub) or bidet is soothing, and can be useful for cleansing the area after bowel movements.     To avoid constipation, take two tablespoons of natural wheat bran, natural oat bran, flax, Benefiber or any over the counter fiber supplement and increase your water intake to 7-8 glasses daily.    Unless you have been prescribed anorectal medication, do not put anything inside your rectum for two weeks: No suppositories, enemas, fingers, etc.  Occasionally, you may have more bleeding than usual after the banding procedure.  This is often from the untreated hemorrhoids rather than the treated one.  Don't be concerned if there is a tablespoon or so of blood.  If there is more blood than this, lie flat with your bottom higher than your head and apply an ice pack to the area. If the bleeding does not stop within a half an hour or if you feel faint, call our office at [phone] or go to the emergency room.  Problems are not common; however, if there is a substantial amount of bleeding, severe pain, chills, fever or difficulty passing urine (very rare) or other problems, you should call us at [phone] or report to the nearest emergency room.  Do not stay seated continuously for more than 2-3 hours for a day or two after the procedure.  Tighten your buttock muscles 10-15 times every two hours and take 10-15 deep breaths every 1-2 hours.  Do not spend more than a few minutes on the toilet if you cannot empty your bowel; instead re-visit the toilet at a later  time.   It was a pleasure to see you today!  Vito Cirigliano, D.O.

## 2023-07-17 ENCOUNTER — Other Ambulatory Visit: Payer: Self-pay | Admitting: Internal Medicine

## 2023-08-19 ENCOUNTER — Encounter: Payer: Self-pay | Admitting: Gastroenterology

## 2023-08-19 ENCOUNTER — Ambulatory Visit (INDEPENDENT_AMBULATORY_CARE_PROVIDER_SITE_OTHER): Payer: Medicare Other | Admitting: Gastroenterology

## 2023-08-19 VITALS — BP 116/62 | HR 96 | Ht 68.0 in | Wt 191.0 lb

## 2023-08-19 DIAGNOSIS — K641 Second degree hemorrhoids: Secondary | ICD-10-CM

## 2023-08-19 DIAGNOSIS — K649 Unspecified hemorrhoids: Secondary | ICD-10-CM | POA: Diagnosis not present

## 2023-08-19 NOTE — Progress Notes (Signed)
Chief Complaint:    Symptomatic Internal Hemorrhoids; Hemorrhoid Band Ligation  GI History: 77 year old male with history of CAD s/p stent, diabetes, HLD, prostate CA  s/p external beam radiation 2019, follows in the GI clinic for the following:   1) History of colon polyps   2) Symptomatic hemorrhoids.  Has had hemorrhoidectomy and thermal therapy in the past.  Return of hemorrhoidal symptoms over the last few years with intermittent BRBPR.  Recurrent hemorrhoids noted during colonoscopy in 04/2023. - 07/13/2023: Banding of RP hemorrhoid   3) GERD.  Index symptoms of regurgitation and heartburn.  No dysphagia.  Currently well-controlled.   Endoscopic History: - EGD (2004): Normal - Colonoscopy (2011): 5 mm inflammatory polyp in transverse colon, diverticulosis, hemorrhoids.  Recommended repeat in 10 years - 01/2020: Colonoscopy: 3 subcentimeter adenomas in the sigmoid colon and rectum, along with sigmoid diverticulosis, internal hemorrhoids, and a 5 mm rectal lipoma - 04/2023: Colonoscopy: 5 mm transverse colon adenoma, 2 mm distal sigmoid polyp (benign inflammatory polyp), sigmoid diverticulosis, small grade 2 internal hemorrhoids.  Recommended repeat in 5 years  HPI:     Patient is a 77 y.o. malewith a history of symptomatic internal hemorrhoids presenting to the Gastroenterology Clinic for follow-up and ongoing treatment. The patient presents with symptomatic grade 2 hemorrhoids, unresponsive to maximal medical therapy, requesting rubber band ligation of symptomatic hemorrhoidal disease.  Did well with first hemorrhoid banding.  No change in medical or surgical history, medications, allergies, social history since last appointment with me.   Review of systems:     No chest pain, no SOB, no fevers, no urinary sx   Past Medical History:  Diagnosis Date   Arthritis    CAD (coronary artery disease)    s/p stent 6/08(-) myoview, 8/11 negative stress test   Diabetes mellitus     Diverticulosis of colon    Elevated PSA    ERECTILE DYSFUNCTION    GERD    Hemorrhoid    problems off and on   Herniated disc    sess chiropractor 2010   HOH (hard of hearing)    Hyperlipidemia    Prostate cancer (HCC)    Prostate cancer (HCC)    Vocal cord polyp     Patient's surgical history, family medical history, social history, medications and allergies were all reviewed in Epic    Current Outpatient Medications  Medication Sig Dispense Refill   aspirin EC 81 MG tablet Take 81 mg by mouth daily.      atorvastatin (LIPITOR) 80 MG tablet Take 1 tablet (80 mg total) by mouth daily. 90 tablet 3   Biotin 2500 MCG CAPS Take 2,500 mcg by mouth daily.      cholecalciferol (VITAMIN D3) 10 MCG/ML LIQD oral liquid Take 400 Units by mouth daily.     clobetasol ointment (TEMOVATE) 0.05 % Apply 1 Application topically as needed (poison ivy).     co-enzyme Q-10 30 MG capsule Take 30 mg by mouth daily.     ezetimibe (ZETIA) 10 MG tablet TAKE 1 TABLET BY MOUTH EVERY DAY 90 tablet 3   fish oil-omega-3 fatty acids 1000 MG capsule Take 4 g by mouth daily.     fluticasone (FLONASE) 50 MCG/ACT nasal spray Place 1-2 sprays into both nostrils daily.     GLUCOSAMINE-CHONDROITIN PO Take 1 tablet by mouth daily.     metFORMIN (GLUCOPHAGE) 850 MG tablet TAKE 1 TABLET (850 MG TOTAL) BY MOUTH 3 (THREE) TIMES DAILY WITH MEALS. 270 tablet 1  Multiple Vitamin (MULTIVITAMIN) capsule Take 1 capsule by mouth daily.     Multiple Vitamins-Minerals (PRESERVISION AREDS 2+MULTI VIT PO)      nitroGLYCERIN (NITROSTAT) 0.4 MG SL tablet Place 1 tablet (0.4 mg total) under the tongue every 5 (five) minutes x 3 doses as needed for chest pain. 25 tablet 3   ramipril (ALTACE) 5 MG capsule TAKE 1 CAPSULE BY MOUTH EVERY DAY 90 capsule 1   zinc gluconate 50 MG tablet Take 50 mg by mouth daily.     No current facility-administered medications for this visit.    Physical Exam:     BP 116/62   Pulse 96   Ht 5\' 8"   (1.727 m)   Wt 191 lb (86.6 kg)   BMI 29.04 kg/m   GENERAL:  Pleasant male in NAD PSYCH: : Cooperative, normal affect NEURO: Alert and oriented x 3, no focal neurologic deficits Rectal exam: Sensation intact and preserved anal wink.  Grade 1-2 hemorrhoids noted in LL and RA positions on exam.  No external anal fissures noted. Normal sphincter tone. No palpable mass. No blood on the exam glove. (Chaperone: Thompson Grayer, CMA).   IMPRESSION and PLAN:    #1.  Symptomatic internal hemorrhoids: PROCEDURE NOTE: The patient presents with symptomatic grade 2 hemorrhoids, unresponsive to maximal medical therapy, requesting rubber band ligation of symptomatic hemorrhoidal disease.  All risks, benefits and alternative forms of therapy were described and informed consent was obtained.  In the Left Lateral Decubitus position, anoscopic examination revealed grade 2 hemorrhoids in the RA and LL position(s).  The anorectum was pre-medicated with RectiCare. The decision was made to band the LL internal hemorrhoid, and the [facility] O'Regan System was used to perform band ligation without complication.  Digital anorectal examination was then performed to assure proper positioning of the band, and to adjust the banded tissue as required.  The patient was discharged home without pain or other issues.  Dietary and behavioral recommendations were given and along with follow-up instructions.     The following adjunctive treatments were recommended:  -Resume high-fiber diet with fiber supplement (i.e. Citrucel or Benefiber) with goal for soft stools without straining to have a BM. -Resume adequate fluid intake.  The patient will return in 4 for follow-up and possible additional banding as required. No complications were encountered and the patient tolerated the procedure well.     #2.  History of colon polyps - Repeat colonoscopy in 2029 for ongoing polyp surveillance      Verlin Dike Sreekar Broyhill ,DO, FACG  08/19/2023, 11:45 AM

## 2023-08-19 NOTE — Patient Instructions (Signed)
_______________________________________________________  If your blood pressure at your visit was 140/90 or greater, please contact your primary care physician to follow up on this.  _______________________________________________________  If you are age 76 or older, your body mass index should be between 23-30. Your Body mass index is 29.04 kg/m. If this is out of the aforementioned range listed, please consider follow up with your Primary Care Provider.  If you are age 15 or younger, your body mass index should be between 19-25. Your Body mass index is 29.04 kg/m. If this is out of the aformentioned range listed, please consider follow up with your Primary Care Provider.   ________________________________________________________  The [facility] providers would like to encourage you to use [facility] to communicate with providers for non-urgent requests or questions.  Due to long hold times on the telephone, sending your provider a message by [facility] may be a faster and more efficient way to get a response.  Please allow 48 business hours for a response.  Please remember that this is for non-urgent requests.  _______________________________________________________  [place] [facility] PROCEDURE    FOLLOW-UP CARE   The procedure you have had should have been relatively painless since the banding of the area involved does not have nerve endings and there is no pain sensation.  The rubber band cuts off the blood supply to the hemorrhoid and the band may fall off as soon as 48 hours after the banding (the band may occasionally be seen in the toilet bowl following a bowel movement). You may notice a temporary feeling of fullness in the rectum which should respond adequately to plain Tylenol or Motrin.  Following the banding, avoid strenuous exercise that evening and resume full activity the next day.  A sitz bath (soaking in a warm tub) or bidet is soothing, and can be useful for cleansing the area  after bowel movements.     To avoid constipation, take two tablespoons of natural wheat bran, natural oat bran, flax, Benefiber or any over the counter fiber supplement and increase your water intake to 7-8 glasses daily.    Unless you have been prescribed anorectal medication, do not put anything inside your rectum for two weeks: No suppositories, enemas, fingers, etc.  Occasionally, you may have more bleeding than usual after the banding procedure.  This is often from the untreated hemorrhoids rather than the treated one.  Don't be concerned if there is a tablespoon or so of blood.  If there is more blood than this, lie flat with your bottom higher than your head and apply an ice pack to the area. If the bleeding does not stop within a half an hour or if you feel faint, call our office at [phone] or go to the emergency room.  Problems are not common; however, if there is a substantial amount of bleeding, severe pain, chills, fever or difficulty passing urine (very rare) or other problems, you should call us at [phone] or report to the nearest emergency room.  Do not stay seated continuously for more than 2-3 hours for a day or two after the procedure.  Tighten your buttock muscles 10-15 times every two hours and take 10-15 deep breaths every 1-2 hours.  Do not spend more than a few minutes on the toilet if you cannot empty your bowel; instead re-visit the toilet at a later time.   It was a pleasure to see you today!  Vito Cirigliano, D.O.

## 2023-09-23 ENCOUNTER — Encounter: Payer: Self-pay | Admitting: Gastroenterology

## 2023-09-23 ENCOUNTER — Ambulatory Visit: Payer: Medicare Other | Admitting: Gastroenterology

## 2023-09-23 VITALS — BP 118/62 | HR 77 | Ht 68.0 in | Wt 194.0 lb

## 2023-09-23 DIAGNOSIS — K648 Other hemorrhoids: Secondary | ICD-10-CM

## 2023-09-23 DIAGNOSIS — Z860100 Personal history of colon polyps, unspecified: Secondary | ICD-10-CM | POA: Diagnosis not present

## 2023-09-23 DIAGNOSIS — K649 Unspecified hemorrhoids: Secondary | ICD-10-CM

## 2023-09-23 DIAGNOSIS — K641 Second degree hemorrhoids: Secondary | ICD-10-CM

## 2023-09-23 NOTE — Progress Notes (Signed)
Chief Complaint:    Symptomatic Internal Hemorrhoids; Hemorrhoid Band Ligation  GI History: 77 year old male with history of CAD s/p stent, diabetes, HLD, prostate CA  s/p external beam radiation 2019, follows in the GI clinic for the following:   1) History of colon polyps   2) Symptomatic hemorrhoids.  Has had hemorrhoidectomy and thermal therapy in the past.  Return of hemorrhoidal symptoms over the last few years with intermittent BRBPR.  Recurrent hemorrhoids noted during colonoscopy in 04/2023. - 07/13/2023: Banding of RP hemorrhoid - 08/19/2023: Banding of LL hemorrhoid   3) GERD.  Index symptoms of regurgitation and heartburn.  No dysphagia.  Currently well-controlled.   Endoscopic History: - EGD (2004): Normal - Colonoscopy (2011): 5 mm inflammatory polyp in transverse colon, diverticulosis, hemorrhoids.  Recommended repeat in 10 years - 01/2020: Colonoscopy: 3 subcentimeter adenomas in the sigmoid colon and rectum, along with sigmoid diverticulosis, internal hemorrhoids, and a 5 mm rectal lipoma - 04/2023: Colonoscopy: 5 mm transverse colon adenoma, 2 mm distal sigmoid polyp (benign inflammatory polyp), sigmoid diverticulosis, small grade 2 internal hemorrhoids.  Recommended repeat in 5 years  HPI:     Patient is a 77 y.o. malewith a history of symptomatic internal hemorrhoids presenting to the Gastroenterology Clinic for follow-up and ongoing treatment. The patient presents with symptomatic grade 2 hemorrhoids, unresponsive to maximal medical therapy, requesting rubber band ligation of symptomatic hemorrhoidal disease.  Has done well with each of his hemorrhoid banding so far.  Has noticed overall clinical improvement.  No change in medical or surgical history, medications, allergies, social history since last appointment with me.   Review of systems:     No chest pain, no SOB, no fevers, no urinary sx   Past Medical History:  Diagnosis Date   Arthritis    CAD (coronary  artery disease)    s/p stent 6/08(-) myoview, 8/11 negative stress test   Diabetes mellitus    Diverticulosis of colon    Elevated PSA    ERECTILE DYSFUNCTION    GERD    Hemorrhoid    problems off and on   Herniated disc    sess chiropractor 2010   HOH (hard of hearing)    Hyperlipidemia    Prostate cancer (HCC)    Prostate cancer (HCC)    Vocal cord polyp     Patient's surgical history, family medical history, social history, medications and allergies were all reviewed in Epic    Current Outpatient Medications  Medication Sig Dispense Refill   aspirin EC 81 MG tablet Take 81 mg by mouth daily.      atorvastatin (LIPITOR) 80 MG tablet Take 1 tablet (80 mg total) by mouth daily. 90 tablet 3   Biotin 2500 MCG CAPS Take 2,500 mcg by mouth daily.      cholecalciferol (VITAMIN D3) 10 MCG/ML LIQD oral liquid Take 400 Units by mouth daily.     clobetasol ointment (TEMOVATE) 0.05 % Apply 1 Application topically as needed (poison ivy).     co-enzyme Q-10 30 MG capsule Take 30 mg by mouth daily.     ezetimibe (ZETIA) 10 MG tablet TAKE 1 TABLET BY MOUTH EVERY DAY 90 tablet 3   fish oil-omega-3 fatty acids 1000 MG capsule Take 4 g by mouth daily.     fluticasone (FLONASE) 50 MCG/ACT nasal spray Place 1-2 sprays into both nostrils daily.     GLUCOSAMINE-CHONDROITIN PO Take 1 tablet by mouth daily.     metFORMIN (GLUCOPHAGE) 850 MG tablet TAKE  1 TABLET (850 MG TOTAL) BY MOUTH 3 (THREE) TIMES DAILY WITH MEALS. 270 tablet 1   Multiple Vitamin (MULTIVITAMIN) capsule Take 1 capsule by mouth daily.     Multiple Vitamins-Minerals (PRESERVISION AREDS 2+MULTI VIT PO)      nitroGLYCERIN (NITROSTAT) 0.4 MG SL tablet Place 1 tablet (0.4 mg total) under the tongue every 5 (five) minutes x 3 doses as needed for chest pain. 25 tablet 3   ramipril (ALTACE) 5 MG capsule TAKE 1 CAPSULE BY MOUTH EVERY DAY 90 capsule 1   zinc gluconate 50 MG tablet Take 50 mg by mouth daily.     No current  facility-administered medications for this visit.    Physical Exam:     BP 118/62   Pulse 77   Ht 5\' 8"  (1.727 m)   Wt 194 lb (88 kg)   BMI 29.50 kg/m   GENERAL:  Pleasant male in NAD PSYCH: : Cooperative, normal affect NEURO: Alert and oriented x 3, no focal neurologic deficits Rectal exam: Sensation intact and preserved anal wink.  Small external skin tag and small grade 1 hemorrhoid and RA position on exam. No external anal fissures noted. Normal sphincter tone. No palpable mass. No blood on the exam glove. (Chaperone: Loysie, CMA).   IMPRESSION and PLAN:    #1.  Symptomatic internal hemorrhoids: PROCEDURE NOTE: The patient presents with symptomatic grade 1-2 hemorrhoids, unresponsive to maximal medical therapy, requesting rubber band ligation of symptomatic hemorrhoidal disease.  All risks, benefits and alternative forms of therapy were described and informed consent was obtained.  In the Left Lateral Decubitus position, anoscopic examination revealed grade 1 hemorrhoids in the RA position(s).  The anorectum was pre-medicated with RectiCare. The decision was made to band the RA internal hemorrhoid, and the [facility] O'Regan System was used to perform band ligation without complication.  Digital anorectal examination was then performed to assure proper positioning of the band, and to adjust the banded tissue as required.  The patient was discharged home without pain or other issues.  Dietary and behavioral recommendations were given and along with follow-up instructions.     The following adjunctive treatments were recommended:  -Resume high-fiber diet with fiber supplement (i.e. Citrucel or Benefiber) with goal for soft stools without straining to have a BM. -Resume adequate fluid intake.  The patient will return as needed if return of hemorrhoidal symptoms for evaluation and possible additional banding as required. No complications were encountered and the patient tolerated the  procedure well.      #2.  History of colon polyps - Repeat colonoscopy in 2029 for ongoing polyp surveillance      Shellia Cleverly ,DO, FACG 09/23/2023, 11:23 AM

## 2023-09-23 NOTE — Patient Instructions (Signed)
_______________________________________________________  If your blood pressure at your visit was 140/90 or greater, please contact your primary care physician to follow up on this.  _______________________________________________________  If you are age 76 or older, your body mass index should be between 23-30. Your Body mass index is 29.5 kg/m. If this is out of the aforementioned range listed, please consider follow up with your Primary Care Provider.  If you are age 38 or younger, your body mass index should be between 19-25. Your Body mass index is 29.5 kg/m. If this is out of the aformentioned range listed, please consider follow up with your Primary Care Provider.   ________________________________________________________  The  GI providers would like to encourage you to use [facility] to communicate with providers for non-urgent requests or questions.  Due to long hold times on the telephone, sending your provider a message by [facility] may be a faster and more efficient way to get a response.  Please allow 48 business hours for a response.  Please remember that this is for non-urgent requests.  _______________________________________________________  [place] [facility] PROCEDURE    FOLLOW-UP CARE   The procedure you have had should have been relatively painless since the banding of the area involved does not have nerve endings and there is no pain sensation.  The rubber band cuts off the blood supply to the hemorrhoid and the band may fall off as soon as 48 hours after the banding (the band may occasionally be seen in the toilet bowl following a bowel movement). You may notice a temporary feeling of fullness in the rectum which should respond adequately to plain Tylenol or Motrin.  Following the banding, avoid strenuous exercise that evening and resume full activity the next day.  A sitz bath (soaking in a warm tub) or bidet is soothing, and can be useful for cleansing the area  after bowel movements.     To avoid constipation, take two tablespoons of natural wheat bran, natural oat bran, flax, Benefiber or any over the counter fiber supplement and increase your water intake to 7-8 glasses daily.    Unless you have been prescribed anorectal medication, do not put anything inside your rectum for two weeks: No suppositories, enemas, fingers, etc.  Occasionally, you may have more bleeding than usual after the banding procedure.  This is often from the untreated hemorrhoids rather than the treated one.  Don't be concerned if there is a tablespoon or so of blood.  If there is more blood than this, lie flat with your bottom higher than your head and apply an ice pack to the area. If the bleeding does not stop within a half an hour or if you feel faint, call our office at [phone] or go to the emergency room.  Problems are not common; however, if there is a substantial amount of bleeding, severe pain, chills, fever or difficulty passing urine (very rare) or other problems, you should call us at [phone] or report to the nearest emergency room.  Do not stay seated continuously for more than 2-3 hours for a day or two after the procedure.  Tighten your buttock muscles 10-15 times every two hours and take 10-15 deep breaths every 1-2 hours.  Do not spend more than a few minutes on the toilet if you cannot empty your bowel; instead re-visit the toilet at a later time.   You will follow up in our office on an as needed basis.  It was a pleasure to see you  today!  Vito Cirigliano, D.O.

## 2023-09-28 DIAGNOSIS — H52203 Unspecified astigmatism, bilateral: Secondary | ICD-10-CM | POA: Diagnosis not present

## 2023-09-28 DIAGNOSIS — H5213 Myopia, bilateral: Secondary | ICD-10-CM | POA: Diagnosis not present

## 2023-09-28 DIAGNOSIS — H353131 Nonexudative age-related macular degeneration, bilateral, early dry stage: Secondary | ICD-10-CM | POA: Diagnosis not present

## 2023-09-28 DIAGNOSIS — H43813 Vitreous degeneration, bilateral: Secondary | ICD-10-CM | POA: Diagnosis not present

## 2023-09-28 DIAGNOSIS — H35363 Drusen (degenerative) of macula, bilateral: Secondary | ICD-10-CM | POA: Diagnosis not present

## 2023-09-28 DIAGNOSIS — H11153 Pinguecula, bilateral: Secondary | ICD-10-CM | POA: Diagnosis not present

## 2023-09-28 DIAGNOSIS — H04123 Dry eye syndrome of bilateral lacrimal glands: Secondary | ICD-10-CM | POA: Diagnosis not present

## 2023-09-28 DIAGNOSIS — Z794 Long term (current) use of insulin: Secondary | ICD-10-CM | POA: Diagnosis not present

## 2023-09-28 DIAGNOSIS — H25013 Cortical age-related cataract, bilateral: Secondary | ICD-10-CM | POA: Diagnosis not present

## 2023-09-28 DIAGNOSIS — H524 Presbyopia: Secondary | ICD-10-CM | POA: Diagnosis not present

## 2023-09-28 DIAGNOSIS — E119 Type 2 diabetes mellitus without complications: Secondary | ICD-10-CM | POA: Diagnosis not present

## 2023-09-28 DIAGNOSIS — H2513 Age-related nuclear cataract, bilateral: Secondary | ICD-10-CM | POA: Diagnosis not present

## 2023-09-28 LAB — HM DIABETES EYE EXAM

## 2023-10-04 DIAGNOSIS — C61 Malignant neoplasm of prostate: Secondary | ICD-10-CM | POA: Diagnosis not present

## 2023-10-04 LAB — PSA: PSA: 0.047

## 2023-10-05 ENCOUNTER — Ambulatory Visit (INDEPENDENT_AMBULATORY_CARE_PROVIDER_SITE_OTHER): Payer: Medicare Other

## 2023-10-05 VITALS — Ht 68.0 in | Wt 185.0 lb

## 2023-10-05 DIAGNOSIS — Z0000 Encounter for general adult medical examination without abnormal findings: Secondary | ICD-10-CM | POA: Diagnosis not present

## 2023-10-05 NOTE — Patient Instructions (Addendum)
Harry Baker , Thank you for taking time to come for your Medicare Wellness Visit. I appreciate your ongoing commitment to your health goals. Please review the following plan we discussed and let me know if I can assist you in the future.   Referrals/Orders/Follow-Ups/Clinician Recommendations:   This is a list of the screening recommended for you and due dates:  Health Maintenance  Topic Date Due   COVID-19 Vaccine (8 - 2024-25 season) 08/18/2023   Eye exam for diabetics  09/15/2023   Hemoglobin A1C  10/02/2023   Yearly kidney function blood test for diabetes  04/01/2024   Yearly kidney health urinalysis for diabetes  04/01/2024   Complete foot exam   04/01/2024   Medicare Annual Wellness Visit  10/04/2024   Colon Cancer Screening  05/11/2028   DTaP/Tdap/Td vaccine (4 - Td or Tdap) 04/30/2032   Pneumonia Vaccine  Completed   Flu Shot  Completed   Hepatitis C Screening  Completed   Zoster (Shingles) Vaccine  Completed   HPV Vaccine  Aged Out    Advanced directives: (In Chart) A copy of your advanced directives are scanned into your chart should your provider ever need it.  Next Medicare Annual Wellness Visit scheduled for next year: Yes

## 2023-10-05 NOTE — Progress Notes (Signed)
Subjective:   Harry Baker is a 77 y.o. male who presents for Medicare Annual/Subsequent preventive examination.  Visit Complete: Virtual I connected with  Harry Baker on 10/05/23 by a audio enabled telemedicine application and verified that I am speaking with the correct person using two identifiers.  Patient Location: Home  Provider Location: Home Office  I discussed the limitations of evaluation and management by telemedicine. The patient expressed understanding and agreed to proceed.  Vital Signs: Because this visit was a virtual/telehealth visit, some criteria may be missing or patient reported. Any vitals not documented were not able to be obtained and vitals that have been documented are patient reported.  Patient Medicare AWV questionnaire was completed by the patient on 10/01/23; I have confirmed that all information answered by patient is correct and no changes since this date.  Cardiac Risk Factors include: advanced age (>40men, >84 women);diabetes mellitus;male gender     Objective:    Today's Vitals   10/05/23 1136  Weight: 185 lb (83.9 kg)  Height: 5\' 8"  (1.727 m)   Body mass index is 28.13 kg/m.     10/05/2023   11:47 AM 10/02/2022   11:03 AM 08/05/2021   11:47 AM 07/10/2019    8:22 AM 11/10/2018    9:11 AM 07/08/2018    8:30 AM  Advanced Directives  Does Patient Have a Medical Advance Directive? Yes Yes No No Yes No  Type of Estate agent of Harry Baker;Living will Healthcare Power of Harry Baker;Living will   Living will   Does patient want to make changes to medical advance directive? No - Patient declined No - Patient declined      Copy of Healthcare Power of Attorney in Chart? Yes - validated most recent copy scanned in chart (See row information) Yes - validated most recent copy scanned in chart (See row information)      Would patient like information on creating a medical advance directive?   No - Patient declined No - Patient  declined  Yes (MAU/Ambulatory/Procedural Areas - Information given)    Current Medications (verified) Outpatient Encounter Medications as of 10/05/2023  Medication Sig   aspirin EC 81 MG tablet Take 81 mg by mouth daily.    atorvastatin (LIPITOR) 80 MG tablet Take 1 tablet (80 mg total) by mouth daily.   Biotin 2500 MCG CAPS Take 2,500 mcg by mouth daily.    cholecalciferol (VITAMIN D3) 10 MCG/ML LIQD oral liquid Take 400 Units by mouth daily.   clobetasol ointment (TEMOVATE) 0.05 % Apply 1 Application topically as needed (poison ivy).   co-enzyme Q-10 30 MG capsule Take 30 mg by mouth daily.   ezetimibe (ZETIA) 10 MG tablet TAKE 1 TABLET BY MOUTH EVERY DAY   fish oil-omega-3 fatty acids 1000 MG capsule Take 4 g by mouth daily.   fluticasone (FLONASE) 50 MCG/ACT nasal spray Place 1-2 sprays into both nostrils daily.   GLUCOSAMINE-CHONDROITIN PO Take 1 tablet by mouth daily.   metFORMIN (GLUCOPHAGE) 850 MG tablet TAKE 1 TABLET (850 MG TOTAL) BY MOUTH 3 (THREE) TIMES DAILY WITH MEALS.   Multiple Vitamin (MULTIVITAMIN) capsule Take 1 capsule by mouth daily.   Multiple Vitamins-Minerals (PRESERVISION AREDS 2+MULTI VIT PO)    nitroGLYCERIN (NITROSTAT) 0.4 MG SL tablet Place 1 tablet (0.4 mg total) under the tongue every 5 (five) minutes x 3 doses as needed for chest pain.   ramipril (ALTACE) 5 MG capsule TAKE 1 CAPSULE BY MOUTH EVERY DAY   zinc gluconate  50 MG tablet Take 50 mg by mouth daily.   No facility-administered encounter medications on file as of 10/05/2023.    Allergies (verified) Patient has no known allergies.   History: Past Medical History:  Diagnosis Date   Arthritis    CAD (coronary artery disease)    s/p stent 6/08(-) myoview, 8/11 negative stress test   Diabetes mellitus    Diverticulosis of colon    Elevated PSA    ERECTILE DYSFUNCTION    GERD    Hemorrhoid    problems off and on   Herniated disc    sess chiropractor 2010   HOH (hard of hearing)     Hyperlipidemia    Prostate cancer (HCC)    Prostate cancer (HCC)    Vocal cord polyp    Past Surgical History:  Procedure Laterality Date   BIOPSY PROSTATE  02/15/2018   CARDIAC CATHETERIZATION  05/1999   stent   HEMORROIDECTOMY     MOUTH SURGERY  2010   TONSILLECTOMY     TRANSRECTAL ULTRASOUND  02/15/2018   VC cyst removal     Family History  Problem Relation Age of Onset   Heart failure Mother    Stroke Father    Prostate cancer Other        uncle   Lung cancer Brother    Colon cancer Other        great aunt   Heart attack Other        GF   Diabetes Sister    Esophageal cancer Neg Hx    Stomach cancer Neg Hx    Social History   Socioeconomic History   Marital status: Married    Spouse name: Not on file   Number of children: 1   Years of education: Not on file   Highest education level: Doctorate  Occupational History   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[employer]  Tobacco Use   Smoking status: Light Smoker    Types: Cigars   Smokeless tobacco: Never   Tobacco comments:    occ cigar  Vaping Use   Vaping status: Never Used  Substance and Sexual Activity   Alcohol use: Yes    Comment: wine socially   Drug use: No   Sexual activity: Not on file  Other Topics Concern   Not on file  Social History Narrative   Married, 1 step son   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    Social Drivers of Health   Financial Resource Strain: Low Risk  (10/01/2023)   Overall Financial Resource Strain (CARDIA)    Difficulty of Paying Living Expenses: Not hard at all  Food Insecurity: No Food Insecurity (10/01/2023)   Hunger Vital Sign    Worried About Running Out of Food in the Last Year: Never true    Ran Out of Food in the Last Year: Never true  Transportation Needs: No Transportation Needs (10/01/2023)   PRAPARE - Administrator, Civil Service (Medical): No    Lack of Transportation (Non-Medical): No  Physical Activity: Insufficiently  Active (10/01/2023)   Exercise Vital Sign    Days of Exercise per Week: 7 days    Minutes of Exercise per Session: 20 min  Stress: No Stress Concern Present (10/01/2023)   [employer] of Occupational Health - Occupational Stress Questionnaire    Feeling of Stress : Not at all  Social Connections: Socially Integrated (10/01/2023)   Social Connection and Isolation Panel [NHANES]  Frequency of Communication with Friends and Family: More than three times a week    Frequency of Social Gatherings with Friends and Family: More than three times a week    Attends Religious Services: 1 to 4 times per year    Active Member of [employer] or Organizations: Yes    Attends Engineer, structural: More than 4 times per year    Marital Status: Married    Tobacco Counseling Ready to quit: No Counseling given: Yes Tobacco comments: occ cigar   Clinical Intake:  Pre-visit preparation completed: Yes  Pain : No/denies pain     BMI - recorded: 28.13 Nutritional Status: BMI 25 -29 Overweight Nutritional Risks: None Diabetes: Yes CBG done?: No Did pt. bring in CBG monitor from home?: No  How often do you need to have someone help you when you read instructions, pamphlets, or other written materials from your doctor or pharmacy?: 1 - Never  Interpreter Needed?: No  Information entered by :: Theresa Mulligan LPN   Activities of Daily Living    10/01/2023    7:37 PM  In your present state of health, do you have any difficulty performing the following activities:  Hearing? 1  Comment Wears hearing aids  Vision? 0  Difficulty concentrating or making decisions? 0  Walking or climbing stairs? 0  Dressing or bathing? 0  Doing errands, shopping? 0  Preparing Food and eating ? N  Using the Toilet? N  In the past six months, have you accidently leaked urine? N  Do you have problems with loss of bowel control? N  Managing your Medications? N  Managing your Finances? N  Housekeeping  or managing your Housekeeping? N    Patient Care Team: Wanda Plump, MD as PCP - General Jens Som, Madolyn Frieze, MD as PCP - Cardiology (Cardiology) Jens Som Madolyn Frieze, MD as Consulting Physician (Cardiology) Shellia Cleverly, DO as Consulting Physician (Gastroenterology) Berneice Heinrich Delbert Phenix., MD as Consulting Physician (Urology)  Indicate any recent Medical Services you may have received from other than [facility] providers in the past year (date may be approximate).     Assessment:   This is a routine wellness examination for [place].  Hearing/Vision screen Hearing Screening - Comments:: Wears hearing aids Vision Screening - Comments:: Wears rx glasses - up to date with routine eye exams with  Dr Logan Bores   Goals Addressed               This Visit's Progress     Lose weight (pt-stated)        Continue to lose weight.       Depression Screen    10/05/2023   11:51 AM 04/02/2023    9:06 AM 10/02/2022   11:04 AM 09/30/2022   11:40 AM 03/17/2022    1:04 PM 08/05/2021   11:53 AM 09/03/2020    1:26 PM  PHQ 2/9 Scores  PHQ - 2 Score 0 0 0 0 0 0 0    Fall Risk    10/05/2023   11:46 AM 10/01/2023    7:37 PM 04/02/2023    9:06 AM 10/02/2022   11:03 AM 09/30/2022   11:38 AM  Fall Risk   Falls in the past year? 0 0 1 1 0  Number falls in past yr: 0 0 0 0 0  Injury with Fall? 0 0 0 0 0  Risk for fall due to : No Fall Risks No Fall Risks  No Fall Risks   Follow  up Falls prevention discussed Falls prevention discussed Falls evaluation completed Falls evaluation completed Falls evaluation completed    MEDICARE RISK AT HOME: Medicare Risk at Home Any stairs in or around the home?: Yes If so, are there any without handrails?: No Home free of loose throw rugs in walkways, pet beds, electrical cords, etc?: Yes Adequate lighting in your home to reduce risk of falls?: Yes Life alert?: No Use of a cane, walker or w/c?: No Grab bars in the bathroom?: No Shower chair or bench in  shower?: No Elevated toilet seat or a handicapped toilet?: Yes  TIMED UP AND GO:  Was the test performed?  No    Cognitive Function:        10/05/2023   11:47 AM 10/02/2022   11:16 AM  6CIT Screen  What Year? 0 points 0 points  What month? 0 points 0 points  What time? 0 points 0 points  Count back from 20 0 points 0 points  Months in reverse 0 points 0 points  Repeat phrase 0 points 0 points  Total Score 0 points 0 points    Immunizations Immunization History  Administered Date(s) Administered   Fluad Quad(high Dose 65+) 06/29/2019, 09/15/2022   Hep A / Hep B 05/30/2008, 06/29/2008, 11/29/2008   Influenza Split 08/16/2012   Influenza Whole 07/20/2007, 07/08/2010   Influenza, High Dose Seasonal PF 07/31/2013, 07/22/2015, 07/06/2016, 07/07/2017, 06/25/2018, 06/23/2023   Influenza,inj,Quad PF,6+ Mos 07/31/2014   Influenza-Unspecified 07/19/2020, 07/06/2021   Moderna Covid-19 Fall Seasonal Vaccine 33yrs & older 09/15/2022, 06/23/2023   Moderna Sars-Covid-2 Vaccination 11/16/2019, 12/20/2019, 08/16/2020   PFIZER Comirnaty(Gray Top)Covid-19 Tri-Sucrose Vaccine 02/05/2021   PNEUMOCOCCAL CONJUGATE-20 09/30/2022   Pfizer Covid-19 Vaccine Bivalent Booster 20yrs & up 07/06/2021   Pneumococcal Conjugate-13 12/17/2014   Pneumococcal Polysaccharide-23 07/25/2002, 08/26/2007, 09/01/2012   Respiratory Syncytial Virus Vaccine,Recomb Aduvanted(Arexvy) 09/15/2022   Td 07/25/2002   Tdap 11/23/2011, 04/30/2022   Zoster Recombinant(Shingrix) 06/29/2019, 09/27/2019   Zoster, Live 08/28/2008    TDAP status: Up to date  Flu Vaccine status: Up to date  Pneumococcal vaccine status: Up to date  Covid-19 vaccine status: Declined, Education has been provided regarding the importance of this vaccine but patient still declined. Advised may receive this vaccine at local pharmacy or Health Dept.or vaccine clinic. Aware to provide a copy of the vaccination record if obtained from local pharmacy  or Health Dept. Verbalized acceptance and understanding.  Qualifies for Shingles Vaccine? Yes   Zostavax completed Yes   Shingrix Completed?: Yes  Screening Tests Health Maintenance  Topic Date Due   COVID-19 Vaccine (8 - 2024-25 season) 08/18/2023   OPHTHALMOLOGY EXAM  09/15/2023   HEMOGLOBIN A1C  10/02/2023   Diabetic kidney evaluation - eGFR measurement  04/01/2024   Diabetic kidney evaluation - Urine ACR  04/01/2024   FOOT EXAM  04/01/2024   Medicare Annual Wellness (AWV)  10/04/2024   Colonoscopy  05/11/2028   DTaP/Tdap/Td (4 - Td or Tdap) 04/30/2032   Pneumonia Vaccine 38+ Years old  Completed   INFLUENZA VACCINE  Completed   Hepatitis C Screening  Completed   Zoster Vaccines- Shingrix  Completed   HPV VACCINES  Aged Out    Health Maintenance  Health Maintenance Due  Topic Date Due   COVID-19 Vaccine (8 - 2024-25 season) 08/18/2023   OPHTHALMOLOGY EXAM  09/15/2023   HEMOGLOBIN A1C  10/02/2023    Colorectal cancer screening: Type of screening: Colonoscopy. Completed 05/12/23. Repeat every 5 years    Additional Screening:  Hepatitis  C Screening: does qualify; Completed 06/17/15  Vision Screening: Recommended annual ophthalmology exams for early detection of glaucoma and other disorders of the eye. Is the patient up to date with their annual eye exam?  Yes  Who is the provider or what is the name of the office in which the patient attends annual eye exams? Dr Logan Bores If pt is not established with a provider, would they like to be referred to a provider to establish care? No .   Dental Screening: Recommended annual dental exams for proper oral hygiene  Diabetic Foot Exam: Diabetic Foot Exam: Completed 04/02/23  Community Resource Referral / Chronic Care Management:  CRR required this visit?  No   CCM required this visit?  No     Plan:     I have personally reviewed and noted the following in the patient's chart:   Medical and social history Use of  alcohol, tobacco or illicit drugs  Current medications and supplements including opioid prescriptions. Patient is not currently taking opioid prescriptions. Functional ability and status Nutritional status Physical activity Advanced directives List of other physicians Hospitalizations, surgeries, and ER visits in previous 12 months Vitals Screenings to include cognitive, depression, and falls Referrals and appointments  In addition, I have reviewed and discussed with patient certain preventive protocols, quality metrics, and best practice recommendations. A written personalized care plan for preventive services as well as general preventive health recommendations were provided to patient.     Tillie Rung, LPN   [id number]   After Visit Summary: (MyChart) Due to this being a telephonic visit, the after visit summary with patients personalized plan was offered to patient via MyChart   Nurse Notes: None

## 2023-10-07 ENCOUNTER — Encounter: Payer: Self-pay | Admitting: Internal Medicine

## 2023-10-08 ENCOUNTER — Encounter: Payer: Self-pay | Admitting: Internal Medicine

## 2023-10-08 ENCOUNTER — Ambulatory Visit (INDEPENDENT_AMBULATORY_CARE_PROVIDER_SITE_OTHER): Payer: Medicare Other | Admitting: Internal Medicine

## 2023-10-08 VITALS — BP 116/84 | HR 68 | Temp 97.6°F | Resp 16 | Ht 68.0 in | Wt 193.1 lb

## 2023-10-08 DIAGNOSIS — E1159 Type 2 diabetes mellitus with other circulatory complications: Secondary | ICD-10-CM

## 2023-10-08 DIAGNOSIS — I10 Essential (primary) hypertension: Secondary | ICD-10-CM

## 2023-10-08 DIAGNOSIS — Z7984 Long term (current) use of oral hypoglycemic drugs: Secondary | ICD-10-CM | POA: Diagnosis not present

## 2023-10-08 DIAGNOSIS — E7800 Pure hypercholesterolemia, unspecified: Secondary | ICD-10-CM | POA: Diagnosis not present

## 2023-10-08 LAB — BASIC METABOLIC PANEL
BUN: 20 mg/dL (ref 6–23)
CO2: 31 meq/L (ref 19–32)
Calcium: 9.6 mg/dL (ref 8.4–10.5)
Chloride: 102 meq/L (ref 96–112)
Creatinine, Ser: 0.86 mg/dL (ref 0.40–1.50)
GFR: 83.97 mL/min (ref >60.00)
Glucose, Bld: 94 mg/dL (ref 70–99)
Potassium: 4.4 meq/L (ref 3.5–5.1)
Sodium: 141 meq/L (ref 135–145)

## 2023-10-08 LAB — ALT: ALT: 15 U/L (ref 0–53)

## 2023-10-08 LAB — HEMOGLOBIN A1C: Hgb A1c MFr Bld: 6.5 % (ref 4.6–6.5)

## 2023-10-08 LAB — AST: AST: 21 U/L (ref 0–37)

## 2023-10-08 LAB — LIPID PANEL
Cholesterol: 91 mg/dL (ref 0–200)
HDL: 36.4 mg/dL — ABNORMAL LOW (ref >39.00)
LDL Cholesterol: 37 mg/dL (ref 0–99)
NonHDL: 55.02
Total CHOL/HDL Ratio: 3
Triglycerides: 88 mg/dL (ref 0.0–149.0)
VLDL: 17.6 mg/dL (ref 0.0–40.0)

## 2023-10-08 NOTE — Patient Instructions (Signed)
   GO TO THE LAB : Get the blood work     Next visit with me in 5 to 6 months     Please schedule it at the front desk

## 2023-10-08 NOTE — Progress Notes (Unsigned)
Subjective:    Patient ID: Harry Baker, male    DOB: Mar 03, 1946, 77 y.o.   MRN: [id number]  DOS:  10/08/2023 Type of visit - description: f/u  Chronic medical problems addressed. In general feeling well except for pain around the left hip.  Sees a Land. No blood through BPs or CBGs.  Denies chest pain or difficulty breathing No dysuria or gross hematuria    Wt Readings from Last 3 Encounters:  10/08/23 193 lb 2 oz (87.6 kg)  10/05/23 185 lb (83.9 kg)  09/23/23 194 lb (88 kg)     Review of Systems See above   Past Medical History:  Diagnosis Date   Arthritis    CAD (coronary artery disease)    s/p stent 6/08(-) myoview, 8/11 negative stress test   Diabetes mellitus    Diverticulosis of colon    Elevated PSA    ERECTILE DYSFUNCTION    GERD    Hemorrhoid    problems off and on   Herniated disc    sess chiropractor 2010   HOH (hard of hearing)    Hyperlipidemia    Prostate cancer (HCC)    Prostate cancer (HCC)    Vocal cord polyp     Past Surgical History:  Procedure Laterality Date   BIOPSY PROSTATE  02/15/2018   CARDIAC CATHETERIZATION  05/1999   stent   HEMORROIDECTOMY     MOUTH SURGERY  2010   TONSILLECTOMY     TRANSRECTAL ULTRASOUND  02/15/2018   VC cyst removal      Current Outpatient Medications  Medication Instructions   aspirin EC 81 mg, Daily   atorvastatin (LIPITOR) 80 mg, Oral, Daily   Biotin 2,500 mcg, Daily   cholecalciferol (VITAMIN D3) 400 Units, Daily   clobetasol ointment (TEMOVATE) 0.05 % 1 Application, As needed   co-enzyme Q-10 30 mg, Daily   ezetimibe (ZETIA) 10 MG tablet TAKE 1 TABLET BY MOUTH EVERY DAY   fish oil-omega-3 fatty acids 4 g, Daily   fluticasone (FLONASE) 50 MCG/ACT nasal spray 1-2 sprays, Daily   GLUCOSAMINE-CHONDROITIN PO 1 tablet, Daily   metFORMIN (GLUCOPHAGE) 850 mg, Oral, 3 times daily with meals   Multiple Vitamin (MULTIVITAMIN) capsule 1 capsule, Daily   Multiple Vitamins-Minerals  (PRESERVISION AREDS 2+MULTI VIT PO)    nitroGLYCERIN (NITROSTAT) 0.4 mg, Sublingual, Every 5 min x3 PRN   ramipril (ALTACE) 5 MG capsule Oral, Daily   zinc gluconate 50 mg, Daily       Objective:   Physical Exam BP 116/84   Pulse 68   Temp 97.6 F (36.4 C) (Oral)   Resp 16   Ht 5\' 8"  (1.727 m)   Wt 193 lb 2 oz (87.6 kg)   SpO2 98%   BMI 29.36 kg/m  General:   Well developed, NAD, BMI noted. HEENT:  Normocephalic . Face symmetric, atraumatic Lungs:  CTA B Normal respiratory effort, no intercostal retractions, no accessory muscle use. Heart: RRR,  no murmur.  Lower extremities: no pretibial edema bilaterally  Skin: Not pale. Not jaundice Neurologic:  alert & oriented X3.  Speech normal, gait appropriate for age and unassisted.  Has some difficulty transferring to the examining table due to hip pain Psych--  Cognition and judgment appear intact.  Cooperative with normal attention span and concentration.  Behavior appropriate. No anxious or depressed appearing.      Assessment     Assessment DM HTN Hyperlipidemia CAD, stent 2008, negative stress test 2011 Prostate Cancer 01/2018, s/p  XRT GERD, Nl EGD 2004 (done for hoarseness) ED HOH  PLAN DM: No ambulatory CBGs, good compliance w/Metformin.  Check A1c. HTN: On Altace, no ambulatory BPs, BP today is very good, check BMP. High cholesterol: On atorvastatin, Zetia, checking a FLP AST ALT. CAD: On aspirin, statins, no symptoms. L hip pain: Ongoing pain, sees a chiropractor, offered referral to Ortho, they could do x-rays, MRI and other treatment modalities, he will call when interested Preventive care: Had a flu shot and COVID-vaccine today Colonoscopy done 04-2023 RTC 5 to 6 months.   6-14 DM: On metformin, feet exam negative, check A1c and micro.  Further advised for results. CAD: Asx, does not carry NTG with him, explaining how NTG works, new Rx sent.  Check CBC and BMP. L hip pain: X-ray was negative,  currently under chiropractor care and getting better Preventive care: Booster COVID-vaccine is advised, flu shot this fall. RTC 09/2022 for checkup.

## 2023-10-09 NOTE — Assessment & Plan Note (Signed)
DM: No ambulatory CBGs, good compliance w/Metformin.  Check A1c. HTN: On Altace, no ambulatory BPs, BP today is very good, check BMP. High cholesterol: On atorvastatin, Zetia, checking a FLP AST ALT. CAD: On aspirin, statins, no symptoms. L hip pain: Ongoing pain, sees a chiropractor, offered referral to Ortho, they could do x-rays, MRI and other treatment modalities, he will call if interested Preventive care: Had a flu shot and COVID-vaccine recently Colonoscopy done 04-2023 RTC 5 to 6 months.

## 2023-10-11 DIAGNOSIS — C61 Malignant neoplasm of prostate: Secondary | ICD-10-CM | POA: Diagnosis not present

## 2023-10-11 DIAGNOSIS — N5201 Erectile dysfunction due to arterial insufficiency: Secondary | ICD-10-CM | POA: Diagnosis not present

## 2023-10-12 ENCOUNTER — Encounter: Payer: Self-pay | Admitting: Internal Medicine

## 2023-12-13 ENCOUNTER — Other Ambulatory Visit: Payer: Self-pay | Admitting: Family

## 2023-12-23 ENCOUNTER — Encounter: Payer: Self-pay | Admitting: Internal Medicine

## 2023-12-23 MED ORDER — RAMIPRIL 5 MG PO CAPS: 5.0000 mg | ORAL_CAPSULE | Freq: Every day | ORAL | 1 refills | Status: DC

## 2023-12-23 MED ORDER — METFORMIN HCL 850 MG PO TABS: 850.0000 mg | ORAL_TABLET | Freq: Three times a day (TID) | ORAL | 1 refills | Status: DC

## 2024-03-14 ENCOUNTER — Other Ambulatory Visit: Payer: Self-pay | Admitting: Cardiology

## 2024-03-14 DIAGNOSIS — I2510 Atherosclerotic heart disease of native coronary artery without angina pectoris: Secondary | ICD-10-CM

## 2024-04-07 ENCOUNTER — Encounter: Payer: Self-pay | Admitting: Internal Medicine

## 2024-04-07 ENCOUNTER — Ambulatory Visit: Payer: Medicare Other | Admitting: Internal Medicine

## 2024-04-07 VITALS — BP 122/66 | HR 51 | Temp 97.8°F | Resp 16 | Ht 68.0 in | Wt 195.4 lb

## 2024-04-07 DIAGNOSIS — I10 Essential (primary) hypertension: Secondary | ICD-10-CM | POA: Diagnosis not present

## 2024-04-07 DIAGNOSIS — I2510 Atherosclerotic heart disease of native coronary artery without angina pectoris: Secondary | ICD-10-CM

## 2024-04-07 DIAGNOSIS — Z7984 Long term (current) use of oral hypoglycemic drugs: Secondary | ICD-10-CM | POA: Diagnosis not present

## 2024-04-07 DIAGNOSIS — E7800 Pure hypercholesterolemia, unspecified: Secondary | ICD-10-CM | POA: Diagnosis not present

## 2024-04-07 DIAGNOSIS — E1159 Type 2 diabetes mellitus with other circulatory complications: Secondary | ICD-10-CM | POA: Diagnosis not present

## 2024-04-07 DIAGNOSIS — M25552 Pain in left hip: Secondary | ICD-10-CM | POA: Diagnosis not present

## 2024-04-07 LAB — CBC WITH DIFFERENTIAL/PLATELET
Basophils Absolute: 0.1 10*3/uL (ref 0.0–0.1)
Basophils Relative: 1.7 % (ref 0.0–3.0)
Eosinophils Absolute: 0.4 10*3/uL (ref 0.0–0.7)
Eosinophils Relative: 6 % — ABNORMAL HIGH (ref 0.0–5.0)
HCT: 43.9 % (ref 39.0–52.0)
Hemoglobin: 14.6 g/dL (ref 13.0–17.0)
Lymphocytes Relative: 34.4 % (ref 12.0–46.0)
Lymphs Abs: 2.2 10*3/uL (ref 0.7–4.0)
MCHC: 33.2 g/dL (ref 30.0–36.0)
MCV: 92.4 fl (ref 78.0–100.0)
Monocytes Absolute: 0.4 10*3/uL (ref 0.1–1.0)
Monocytes Relative: 6.1 % (ref 3.0–12.0)
Neutro Abs: 3.3 10*3/uL (ref 1.4–7.7)
Neutrophils Relative %: 51.8 % (ref 43.0–77.0)
Platelets: 218 10*3/uL (ref 150.0–400.0)
RBC: 4.75 Mil/uL (ref 4.22–5.81)
RDW: 14.1 % (ref 11.5–15.5)
WBC: 6.3 10*3/uL (ref 4.0–10.5)

## 2024-04-07 LAB — MICROALBUMIN / CREATININE URINE RATIO
Creatinine,U: 110.1 mg/dL
Microalb Creat Ratio: UNDETERMINED mg/g (ref 0.0–30.0)
Microalb, Ur: 0.7 mg/dL (ref 0.0–1.9)

## 2024-04-07 LAB — BASIC METABOLIC PANEL WITH GFR
BUN: 23 mg/dL (ref 6–23)
CO2: 30 meq/L (ref 19–32)
Calcium: 9.9 mg/dL (ref 8.4–10.5)
Chloride: 101 meq/L (ref 96–112)
Creatinine, Ser: 0.93 mg/dL (ref 0.40–1.50)
GFR: 79.35 mL/min (ref >60.00)
Glucose, Bld: 102 mg/dL — ABNORMAL HIGH (ref 70–99)
Potassium: 4.5 meq/L (ref 3.5–5.1)
Sodium: 139 meq/L (ref 135–145)

## 2024-04-07 LAB — HEMOGLOBIN A1C: Hgb A1c MFr Bld: 6.6 % — ABNORMAL HIGH (ref 4.6–6.5)

## 2024-04-07 MED ORDER — NITROGLYCERIN 0.4 MG SL SUBL: 0.4000 mg | SUBLINGUAL_TABLET | SUBLINGUAL | 3 refills | Status: AC | PRN

## 2024-04-07 NOTE — Progress Notes (Unsigned)
 Subjective:    Patient ID: Harry Baker, male    DOB: [birth year], 78 y.o.   MRN: [id number]  DOS:  04/07/2024 Type of visit - description: Follow-up  Chronic medical problems addressed.  Ongoing left hip pain with occasional radiation to the lateral aspect of the thigh. No low back pain per se. Sometimes has trouble picking up the left leg.  Reports no fall.  No paresthesias.  History of CAD: Denies chest pain or difficulty breathing.  No palpitations or edema  Review of Systems See above   Past Medical History:  Diagnosis Date   Arthritis    CAD (coronary artery disease)    s/p stent 6/08(-) myoview , 8/11 negative stress test   Diabetes mellitus    Diverticulosis of colon    Elevated PSA    ERECTILE DYSFUNCTION    GERD    Hemorrhoid    problems off and on   Herniated disc    sess chiropractor 2010   HOH (hard of hearing)    Hyperlipidemia    Prostate cancer (HCC)    Prostate cancer (HCC)    Vocal cord polyp     Past Surgical History:  Procedure Laterality Date   BIOPSY PROSTATE  02/15/2018   CARDIAC CATHETERIZATION  05/1999   stent   HEMORROIDECTOMY     MOUTH SURGERY  2010   TONSILLECTOMY     TRANSRECTAL ULTRASOUND  02/15/2018   VC cyst removal      Current Outpatient Medications  Medication Instructions   aspirin EC 81 mg, Daily   atorvastatin  (LIPITOR) 80 mg, Oral, Daily   Biotin 2,500 mcg, Daily   cholecalciferol (VITAMIN D3) 400 Units, Daily   clobetasol  ointment (TEMOVATE ) 0.05 % 1 Application, As needed   co-enzyme Q-10 30 mg, Daily   ezetimibe  (ZETIA ) 10 mg, Oral, Daily   fish oil-omega-3 fatty acids 4 g, Daily   fluticasone (FLONASE) 50 MCG/ACT nasal spray 1-2 sprays, Daily   GLUCOSAMINE-CHONDROITIN PO 1 tablet, Daily   metFORMIN  (GLUCOPHAGE ) 850 mg, Oral, 3 times daily with meals   Multiple Vitamin (MULTIVITAMIN) capsule 1 capsule, Daily   Multiple Vitamins-Minerals (PRESERVISION AREDS 2+MULTI VIT PO)    nitroGLYCERIN  (NITROSTAT ) 0.4 mg,  Sublingual, Every 5 min x3 PRN   ramipril  (ALTACE ) 5 mg, Oral, Daily   zinc gluconate 50 mg, Daily       Objective:   Physical Exam BP 122/66   Pulse (!) 51   Temp 97.8 F (36.6 C) (Oral)   Resp 16   Ht 5' 8 (1.727 m)   Wt 195 lb 6 oz (88.6 kg)   SpO2 97%   BMI 29.71 kg/m  General: Well developed, NAD, BMI noted Neck: No  thyromegaly  HEENT:  Normocephalic . Face symmetric, atraumatic Lungs:  CTA B Normal respiratory effort, no intercostal retractions, no accessory muscle use. Heart: RRR,  no murmur.  Abdomen:  Not distended, soft, non-tender. No rebound or rigidity. MSK: No TTP at the lumbar spine DM foot exam: No edema, normal pinprick examination, well-perfused toes Skin: Exposed areas without rash. Not pale. Not jaundice Neurologic:  alert & oriented X3.  Speech normal, gait appropriate for age and unassisted Strength symmetric and appropriate for age. DTRs symmetric, straight leg test negative Psych: Cognition and judgment appear intact.  Cooperative with normal attention span and concentration.  Behavior appropriate. No anxious or depressed appearing.     Assessment     Assessment DM HTN Hyperlipidemia CAD, stent 2008, negative stress test 2011  Prostate Cancer 01/2018, s/p XRT GERD, Nl EGD 2004 (done for hoarseness) ED HOH  PLAN DM: On metformin , no ambulatory CBGs, feet exam negative, check A1c and micro HTN: On Altace , no ambulatory BPs, BP today is very good, no change.  Check BMP. High cholesterol: Well-controlled per last FLP, continue Lipitor and Zetia  CAD: On aspirin, asymptomatic, has not used NTG but it was refilled.  Recommend to renew it every few months. L hip pain: Ongoing problem, has seen a chiropractor.  My concern is he has some difficulty picking up his legs and that makes him more prone to falls.  He agreed to be referred to orthopedics for further eval. Preventive care reviewed:  -Td 2023 - Pneumovax 2003, 2008, 2013;  Prevnar  11-2014. PNM 20: 2023 - s/p RSV -zostavax 11-09; s/p shingrix x2 -COVID VAX done 2024 -Vaccines I recommend: Flu shot every fall. -CCS: colonoscopy 2000 , 11-2009 , 01/2020, next  01/2023 per GI letter  -h/o prostate ca: per urology, LOV 09/2023 -Advance care planning document scanned  RTC 5 to 6 months  12 DM: No ambulatory CBGs, good compliance w/Metformin .  Check A1c. HTN: On Altace , no ambulatory BPs, BP today is very good, check BMP. High cholesterol: On atorvastatin , Zetia , checking a FLP AST ALT. CAD: On aspirin, statins, no symptoms. L hip pain: Ongoing pain, sees a chiropractor, offered referral to Ortho, they could do x-rays, MRI and other treatment modalities, he will call if interested Preventive care: Had a flu shot and COVID-vaccine recently Colonoscopy done 04-2023 RTC 5 to 6 months.

## 2024-04-07 NOTE — Patient Instructions (Signed)
 We are referring you to a orthopedic doctor regards your left hip pain.  Get a flu shot this fall  GO TO THE LAB :  Get the blood work   Your results will be posted on MyChart with my comments  Next office visit for a checkup in 5 to 6 months Please make an appointment before you leave today

## 2024-04-09 ENCOUNTER — Encounter: Payer: Self-pay | Admitting: Internal Medicine

## 2024-04-09 ENCOUNTER — Ambulatory Visit: Payer: Self-pay | Admitting: Internal Medicine

## 2024-04-09 NOTE — Assessment & Plan Note (Signed)
  Preventive care reviewed: -Td 2023 - Pneumovax 2003, 2008, 2013;  Prevnar 11-2014. PNM 20: 2023 - s/p RSV -zostavax 11-09; s/p shingrix x2 -COVID VAX done 2024 -Vaccines I recommend: Flu shot every fall. -CCS: colonoscopy 2000 , 11-2009 , 01/2020, 04/2023 next per GI -h/o prostate ca: per urology, LOV 09/2023 -Advance care planning document scanned

## 2024-04-09 NOTE — Assessment & Plan Note (Signed)
 DM: On metformin , no ambulatory CBGs, feet exam negative, check A1c and micro HTN: On Altace , no ambulatory BPs, BP today is very good, no change.  Check BMP. High cholesterol: Well-controlled per last FLP, continue Lipitor and Zetia  CAD: On aspirin, asymptomatic, has not used NTG but it was refilled.  Recommend to renew it every few months. L hip pain: Ongoing problem, has seen a chiropractor.  My concern is he has some difficulty picking up his legs and that makes him more prone to falls.  He agreed to be referred to orthopedics for further eval. Preventive care reviewed  RTC 5 to 6 months

## 2024-06-14 NOTE — Progress Notes (Signed)
 HPI: FU coronary artery disease with prior PCI of his right coronary artery in August 2000. He also had residual LAD disease at that time.  His most recent Myoview  in August of 2014 showed no ischemia or infarction and ejection fraction of 62%.  Abdominal ultrasound August 2019 showed no aneurysm.  Carotid Dopplers February 2023 showed no significant stenosis.  Since I last saw him, the patient denies any dyspnea on exertion, orthopnea, PND, pedal edema, palpitations, syncope or chest pain.   Current Outpatient Medications  Medication Sig Dispense Refill   aspirin EC 81 MG tablet Take 81 mg by mouth daily.      atorvastatin  (LIPITOR) 80 MG tablet TAKE 1 TABLET BY MOUTH EVERY DAY 90 tablet 0   Biotin 2500 MCG CAPS Take 2,500 mcg by mouth daily.      cholecalciferol (VITAMIN D3) 10 MCG/ML LIQD oral liquid Take 400 Units by mouth daily.     clobetasol  ointment (TEMOVATE ) 0.05 % Apply 1 Application topically as needed (poison ivy).     co-enzyme Q-10 30 MG capsule Take 30 mg by mouth daily.     ezetimibe  (ZETIA ) 10 MG tablet TAKE 1 TABLET BY MOUTH EVERY DAY 90 tablet 0   fish oil-omega-3 fatty acids 1000 MG capsule Take 4 g by mouth daily.     fluticasone (FLONASE) 50 MCG/ACT nasal spray Place 1-2 sprays into both nostrils daily.     GLUCOSAMINE-CHONDROITIN PO Take 1 tablet by mouth daily.     metFORMIN  (GLUCOPHAGE ) 850 MG tablet Take 1 tablet (850 mg total) by mouth 3 (three) times daily with meals. 270 tablet 1   Multiple Vitamin (MULTIVITAMIN) capsule Take 1 capsule by mouth daily.     Multiple Vitamins-Minerals (PRESERVISION AREDS 2+MULTI VIT PO)      nitroGLYCERIN  (NITROSTAT ) 0.4 MG SL tablet Place 1 tablet (0.4 mg total) under the tongue every 5 (five) minutes x 3 doses as needed for chest pain. 25 tablet 3   ramipril  (ALTACE ) 5 MG capsule Take 1 capsule (5 mg total) by mouth daily. 90 capsule 1   zinc gluconate 50 MG tablet Take 50 mg by mouth daily.     No current  facility-administered medications for this visit.     Past Medical History:  Diagnosis Date   Arthritis    CAD (coronary artery disease)    s/p stent 6/08(-) myoview , 8/11 negative stress test   Diabetes mellitus    Diverticulosis of colon    Elevated PSA    ERECTILE DYSFUNCTION    GERD    Hemorrhoid    problems off and on   Herniated disc    sess chiropractor 2010   HOH (hard of hearing)    Hyperlipidemia    Prostate cancer (HCC)    Prostate cancer (HCC)    Vocal cord polyp     Past Surgical History:  Procedure Laterality Date   BIOPSY PROSTATE  02/15/2018   CARDIAC CATHETERIZATION  05/1999   stent   HEMORROIDECTOMY     MOUTH SURGERY  2010   TONSILLECTOMY     TRANSRECTAL ULTRASOUND  02/15/2018   VC cyst removal      Social History   Socioeconomic History   Marital status: Married    Spouse name: Not on file   Number of children: 1   Years of education: Not on file   Highest education level: Doctorate  Occupational History   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[employer]  Tobacco Use   Smoking status: Light Smoker    Types: Cigars   Smokeless tobacco: Never   Tobacco comments:    occ cigar  Vaping Use   Vaping status: Never Used  Substance and Sexual Activity   Alcohol use: Yes    Comment: wine socially   Drug use: No   Sexual activity: Not on file  Other Topics Concern   Not on file  Social History Narrative   Married, 1 step son   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    Social Drivers of Health   Financial Resource Strain: Low Risk  (03/31/2024)   Overall Financial Resource Strain (CARDIA)    Difficulty of Paying Living Expenses: Not hard at all  Food Insecurity: No Food Insecurity (03/31/2024)   Hunger Vital Sign    Worried About Running Out of Food in the Last Year: Never true    Ran Out of Food in the Last Year: Never true  Transportation Needs: No Transportation Needs (03/31/2024)   PRAPARE - Scientist, research (physical sciences) (Medical): No    Lack of Transportation (Non-Medical): No  Physical Activity: Insufficiently Active (03/31/2024)   Exercise Vital Sign    Days of Exercise per Week: 4 days    Minutes of Exercise per Session: 20 min  Stress: No Stress Concern Present (03/31/2024)   [employer] of Occupational Health - Occupational Stress Questionnaire    Feeling of Stress: Not at all  Social Connections: Socially Integrated (03/31/2024)   Social Connection and Isolation Panel    Frequency of Communication with Friends and Family: More than three times a week    Frequency of Social Gatherings with Friends and Family: More than three times a week    Attends Religious Services: 1 to 4 times per year    Active Member of [employer] or Organizations: Yes    Attends Engineer, structural: More than 4 times per year    Marital Status: Married  Catering manager Violence: Not At Risk (10/05/2023)   Humiliation, Afraid, Rape, and Kick questionnaire    Fear of Current or Ex-Partner: No    Emotionally Abused: No    Physically Abused: No    Sexually Abused: No    Family History  Problem Relation Age of Onset   Heart failure Mother    Stroke Father    Prostate cancer Other        uncle   Lung cancer Brother    Colon cancer Other        great aunt   Heart attack Other        GF   Diabetes Sister    Esophageal cancer Neg Hx    Stomach cancer Neg Hx     ROS: no fevers or chills, productive cough, hemoptysis, dysphasia, odynophagia, melena, hematochezia, dysuria, hematuria, rash, seizure activity, orthopnea, PND, pedal edema, claudication. Remaining systems are negative.  Physical Exam: Well-developed well-nourished in no acute distress.  Skin is warm and dry.  HEENT is normal.  Neck is supple.  Chest is clear to auscultation with normal expansion.  Cardiovascular exam is regular rate and rhythm.  Abdominal exam nontender or distended. No masses palpated. Extremities show no  edema. neuro grossly intact  EKG Interpretation Date/Time:  Wednesday June 28 2024 08:29:10 EDT Ventricular Rate:  76 PR Interval:  194 QRS Duration:  86 QT Interval:  372 QTC Calculation: 418 R Axis:   40  Text Interpretation: Normal sinus rhythm Normal  ECG Confirmed by Pietro Rogue ([id number]) on 06/28/2024 8:29:50 AM    A/P  1 coronary artery disease-patient doing well from a symptomatic standpoint with no chest pain.  Continue medical therapy with aspirin and statin.  2 hyperlipidemia-continue Zetia  and Lipitor. Check lipids and liver.   Rogue Pietro, MD

## 2024-06-28 ENCOUNTER — Ambulatory Visit: Attending: Cardiology | Admitting: Cardiology

## 2024-06-28 ENCOUNTER — Encounter: Payer: Self-pay | Admitting: Cardiology

## 2024-06-28 VITALS — BP 113/76 | HR 76 | Ht 68.0 in | Wt 196.4 lb

## 2024-06-28 DIAGNOSIS — E7800 Pure hypercholesterolemia, unspecified: Secondary | ICD-10-CM | POA: Diagnosis not present

## 2024-06-28 DIAGNOSIS — I2510 Atherosclerotic heart disease of native coronary artery without angina pectoris: Secondary | ICD-10-CM | POA: Diagnosis not present

## 2024-06-28 NOTE — Patient Instructions (Signed)

## 2024-06-29 ENCOUNTER — Ambulatory Visit: Payer: Self-pay | Admitting: Cardiology

## 2024-06-29 LAB — HEPATIC FUNCTION PANEL
ALT: 16 IU/L (ref 0–44)
AST: 25 IU/L (ref 0–40)
Albumin: 4.4 g/dL (ref 3.8–4.8)
Alkaline Phosphatase: 79 IU/L (ref 44–121)
Bilirubin Total: 0.5 mg/dL (ref 0.0–1.2)
Bilirubin, Direct: 0.21 mg/dL (ref 0.00–0.40)
Total Protein: 6.6 g/dL (ref 6.0–8.5)

## 2024-06-29 LAB — LIPID PANEL
Chol/HDL Ratio: 2.2 ratio (ref 0.0–5.0)
Cholesterol, Total: 93 mg/dL — ABNORMAL LOW (ref 100–199)
HDL: 42 mg/dL (ref >39)
LDL Chol Calc (NIH): 39 mg/dL (ref 0–99)
Triglycerides: 45 mg/dL (ref 0–149)
VLDL Cholesterol Cal: 12 mg/dL (ref 5–40)

## 2024-07-07 ENCOUNTER — Ambulatory Visit (INDEPENDENT_AMBULATORY_CARE_PROVIDER_SITE_OTHER): Admitting: Internal Medicine

## 2024-07-07 ENCOUNTER — Encounter: Payer: Self-pay | Admitting: Internal Medicine

## 2024-07-07 VITALS — BP 122/74 | HR 91 | Temp 97.7°F | Resp 16 | Ht 68.0 in | Wt 200.2 lb

## 2024-07-07 DIAGNOSIS — J069 Acute upper respiratory infection, unspecified: Secondary | ICD-10-CM

## 2024-07-07 DIAGNOSIS — M62562 Muscle wasting and atrophy, not elsewhere classified, left lower leg: Secondary | ICD-10-CM

## 2024-07-07 DIAGNOSIS — Z8546 Personal history of malignant neoplasm of prostate: Secondary | ICD-10-CM | POA: Diagnosis not present

## 2024-07-07 DIAGNOSIS — R269 Unspecified abnormalities of gait and mobility: Secondary | ICD-10-CM

## 2024-07-07 NOTE — Assessment & Plan Note (Signed)
 URI: Had a moderate URI a month ago still have some residual symptoms.  No red flags. Plan: Astepro, Mucinex, call if no better, antibiotics?. Varicose veins: Has a long history of varicose veins, more prominent @ L calf  lately, suspect this is because calf atrophy.  We talked about red flags that indicate phlebitis or a DVT including redness, warmness and pain.  He will be in the look out for those symptoms. Radiculopathy, left calf atrophy, gait disorder. About 2 years ago developed pain at the left hip with some radiation to the left leg, saw a chiropractor, the pain has gradually dissipated. Today I noticed some difficulty with his gait and the left calf is atrophic.  After we discussed the issue the only intervention we agreed was to send him for physical therapy to improve his gait and prevent falls. Preventive care: Plans to get a flu and a COVID-vaccine. RTC scheduled for November.

## 2024-07-07 NOTE — Progress Notes (Signed)
 Subjective:    Patient ID: Harry Baker, male    DOB: 1946-08-25, 78 y.o.   MRN: [id number]  DOS:  07/07/2024 Type of visit - description: Acute  A month ago, had a cold, symptoms were at least moderate. After 1 week he started to feel better however has residual postnasal dripping which create cough. Denies fever or chills.  No wheezing.  Admits to some ear congestion.  Also, has a long history of left calf varicose veins, here lately they are more noticeable.  Denies pain, warmness or redness.  I noted the patient to be slightly  limping and  his left calf to be atrophic. States that he has a history of left hip and leg pain for 2 years.  Saw chiropractor, currently with no pain.  Review of Systems See above   Past Medical History:  Diagnosis Date   Arthritis    CAD (coronary artery disease)    s/p stent 6/08(-) myoview , 8/11 negative stress test   Diabetes mellitus    Diverticulosis of colon    Elevated PSA    ERECTILE DYSFUNCTION    GERD    Hemorrhoid    problems off and on   Herniated disc    sess chiropractor 2010   HOH (hard of hearing)    Hyperlipidemia    Prostate cancer (HCC)    Prostate cancer (HCC)    Vocal cord polyp     Past Surgical History:  Procedure Laterality Date   BIOPSY PROSTATE  02/15/2018   CARDIAC CATHETERIZATION  05/1999   stent   HEMORROIDECTOMY     MOUTH SURGERY  2010   TONSILLECTOMY     TRANSRECTAL ULTRASOUND  02/15/2018   VC cyst removal      Current Outpatient Medications  Medication Instructions   aspirin EC 81 mg, Daily   atorvastatin  (LIPITOR) 80 mg, Oral, Daily   Biotin 2,500 mcg, Daily   cholecalciferol (VITAMIN D3) 400 Units, Daily   clobetasol  ointment (TEMOVATE ) 0.05 % 1 Application, As needed   co-enzyme Q-10 30 mg, Daily   ezetimibe  (ZETIA ) 10 mg, Oral, Daily   fish oil-omega-3 fatty acids 4 g, Daily   fluticasone (FLONASE) 50 MCG/ACT nasal spray 1-2 sprays, Daily   GLUCOSAMINE-CHONDROITIN PO 1 tablet, Daily    metFORMIN  (GLUCOPHAGE ) 850 mg, Oral, 3 times daily with meals   Multiple Vitamin (MULTIVITAMIN) capsule 1 capsule, Daily   Multiple Vitamins-Minerals (PRESERVISION AREDS 2+MULTI VIT PO)    nitroGLYCERIN  (NITROSTAT ) 0.4 mg, Sublingual, Every 5 min x3 PRN   ramipril  (ALTACE ) 5 mg, Oral, Daily   zinc gluconate 50 mg, Daily       Objective:   Physical Exam BP 122/74   Pulse 91   Temp 97.7 F (36.5 C) (Oral)   Resp 16   Ht 5' 8 (1.727 m)   Wt 200 lb 4 oz (90.8 kg)   SpO2 97%   BMI 30.45 kg/m  General:   Well developed, NAD, BMI noted. HEENT:  Normocephalic . Face symmetric, atraumatic Nose: Slightly congested.  TMs normal.  Throat symmetric, not red. Lungs: Clear to auscultation bilaterally Cardiovascular: Regular rate and rhythm Lower extremities: Right leg essentially normal Left leg: No pitting edema, at the calf has prominent varicose veins but they are soft, not tender, not red. The left calf seems atrophic, circumference is about 3/4 inch smaller compared to the right side. Skin: Not pale. Not jaundice Neurologic:  alert & oriented X3.  Speech normal, gait appropriate for age  and unassisted Psych--  Cognition and judgment appear intact.  Cooperative with normal attention span and concentration.  Behavior appropriate. No anxious or depressed appearing.      Assessment   Assessment DM HTN Hyperlipidemia CAD, stent 2008, negative stress test 2011 Prostate Cancer 01/2018, s/p XRT GERD, Nl EGD 2004 (done for hoarseness) ED HOH  PLAN URI: Had a moderate URI a month ago still have some residual symptoms.  No red flags. Plan: Astepro, Mucinex, call if no better, antibiotics?. Varicose veins: Has a long history of varicose veins, more prominent @ L calf  lately, suspect this is because calf atrophy.  We talked about red flags that indicate phlebitis or a DVT including redness, warmness and pain.  He will be in the look out for those symptoms. Radiculopathy, left  calf atrophy, gait disorder. About 2 years ago developed pain at the left hip with some radiation to the left leg, saw a chiropractor, the pain has gradually dissipated. Today I noticed some difficulty with his gait and the left calf is atrophic.  After we discussed the issue the only intervention we agreed was to send him for physical therapy to improve his gait and prevent falls. Preventive care: Plans to get a flu and a COVID-vaccine. RTC scheduled for November.

## 2024-07-07 NOTE — Patient Instructions (Addendum)
 For the nasal congestion and cough. Get over-the-counter Astepro, a nasal spray: 2 sprays on each side of the nose twice daily until better Get Mucinex DM, 1 tablet twice daily until better Drink plenty of fluids Call if not much improved in the next 10 days  We are referring you to a physical therapist to help with the left calf weakness  See you in November

## 2024-07-18 DIAGNOSIS — Z23 Encounter for immunization: Secondary | ICD-10-CM | POA: Diagnosis not present

## 2024-08-21 ENCOUNTER — Ambulatory Visit: Attending: Internal Medicine

## 2024-08-21 DIAGNOSIS — M62562 Muscle wasting and atrophy, not elsewhere classified, left lower leg: Secondary | ICD-10-CM | POA: Insufficient documentation

## 2024-08-21 DIAGNOSIS — R269 Unspecified abnormalities of gait and mobility: Secondary | ICD-10-CM | POA: Diagnosis not present

## 2024-08-21 NOTE — Therapy (Signed)
 OUTPATIENT PHYSICAL THERAPY LOWER EXTREMITY EVALUATION   Patient Name: Harry Baker MRN: [id number] DOB:May 06, 1946, 78 y.o., male Today's Date: 08/21/2024  END OF SESSION:  PT End of Session - 08/21/24 1313     Visit Number 1    Authorization Type Medicare    Progress Note Due on Visit 10    PT Start Time 1315    PT Stop Time 1400    PT Time Calculation (min) 45 min          Past Medical History:  Diagnosis Date   Arthritis    CAD (coronary artery disease)    s/p stent 6/08(-) myoview , 8/11 negative stress test   Diabetes mellitus    Diverticulosis of colon    Elevated PSA    ERECTILE DYSFUNCTION    GERD    Hemorrhoid    problems off and on   Herniated disc    sess chiropractor 2010   HOH (hard of hearing)    Hyperlipidemia    Prostate cancer (HCC)    Prostate cancer (HCC)    Vocal cord polyp    Past Surgical History:  Procedure Laterality Date   BIOPSY PROSTATE  02/15/2018   CARDIAC CATHETERIZATION  05/1999   stent   HEMORROIDECTOMY     MOUTH SURGERY  2010   TONSILLECTOMY     TRANSRECTAL ULTRASOUND  02/15/2018   VC cyst removal     Patient Active Problem List   Diagnosis Date Noted   History of prostate cancer 07/07/2024   PCP NOTES >>>>>>>>>>>>>>>>>>>>>>>>>>>>> 01/03/2016   Hemorrhoids 02/01/2014   Annual physical exam 11/23/2011   GERD 11/07/2009   ERECTILE DYSFUNCTION 03/20/2008   DIVERTICULOSIS, COLON 08/26/2007   DMII (diabetes mellitus, type 2) (HCC) 04/12/2007   Hyperlipidemia 04/12/2007   Coronary atherosclerosis 04/12/2007    PCP: Aloysius Mech  REFERRING PROVIDER: Aloysius Mech  REFERRING DIAG:   R26.9 (ICD-10-CM) - Gait disorder  M62.562 (ICD-10-CM) - Atrophy of calf muscles on left    THERAPY DIAG:  No diagnosis found.  Rationale for Evaluation and Treatment: Rehabilitation  ONSET DATE: 07/07/24 Referral date  SUBJECTIVE:   SUBJECTIVE STATEMENT: I do not have much pain anymore. I had sciatica about 2 years ago but that is  better now, might be from the chiropractor. My L calf is smaller and I would like to find exercises to make it stronger.   PERTINENT HISTORY: Patient has a long history of left calf varicose veins, here lately they are more noticeable.  Denies pain, warmness or redness.   I noted the patient to be slightly  limping and  his left calf to be atrophic. About 2 years ago developed pain at the left hip with some radiation to the left leg, saw a chiropractor, the pain has gradually dissipated. Today I noticed some difficulty with his gait and the left calf is atrophic. After we discussed the issue the only intervention we agreed was to send him for physical therapy to improve his gait and prevent falls.  PAIN:  Are you having pain? No  PRECAUTIONS: None  RED FLAGS: None   WEIGHT BEARING RESTRICTIONS: No  FALLS:  Has patient fallen in last 6 months? Yes. Number of falls maybe 2-3x not significant falls and from the dog tripping me   LIVING ENVIRONMENT: Lives with: lives with their spouse Lives in: House/apartment Stairs: Yes: Internal: has a flight but rarely uses steps; on right going up and External: 4 steps; can reach both Has following equipment at  home: Single point cane and Crutches  OCCUPATION: retired. Has a farm by [employer], like to read, likes to hunt sometimes, does some charity work for [employer], like to travel    PLOF: Independent and Independent with gait  PATIENT GOALS: I want to strengthening my calf  NEXT MD VISIT: 11/06/24  OBJECTIVE:  Note: Objective measures were completed at Evaluation unless otherwise noted.  DIAGNOSTIC FINDINGS:hip x-ray from 2023-No acute abnormality noted.  COGNITION: Overall cognitive status: Within functional limits for tasks assessed     SENSATION: WFL  POSTURE: rounded shoulders  PALPATION: No TTP  LOWER EXTREMITY ROM: all WNL   LOWER EXTREMITY MMT: grossly 5/5 BLE, slight weakness in L hip compared to R  R calf 25 L  calf 25 but with burning    FUNCTIONAL TESTS:  5 times sit to stand: 18s BERG 52/56  GAIT: Distance walked: in clinic distances Assistive device utilized: None Level of assistance: Complete Independence Comments: has a slight limp, reports this was developed from a fall a long time ago and sciatica about 2 years ago but is better                                                                                                                                TREATMENT DATE: 08/21/24- EVAL    PATIENT EDUCATION:  Education details: POC and HEP Person educated: Patient Education method: Medical Illustrator Education comprehension: verbalized understanding and returned demonstration  HOME EXERCISE PROGRAM: Access Code: [email or web address] URL: [email or web address] Date: 08/21/2024 Prepared by: Almetta Fam  Exercises - Heel Raises with Counter Support  - 1 x daily - 7 x weekly - 2 sets - 10 reps - Single Leg Heel Raise with Counter Support  - 1 x daily - 7 x weekly - 2 sets - 10 reps - Gastroc Stretch on Wall  - 1 x daily - 7 x weekly - 2 reps - 15 hold - Long Sitting Ankle Plantar Flexion with Resistance  - 1 x daily - 7 x weekly - 2 sets - 10 reps - Standing Tandem Balance with Counter Support  - 1 x daily - 7 x weekly - 1 sets - 5 reps - 30 hold - Standing Single Leg Stance with Counter Support  - 1 x daily - 7 x weekly - 2 sets - 5 reps - 10 hold - Sit to Stand  - 1 x daily - 7 x weekly - 2 sets - 10 reps  ASSESSMENT:  CLINICAL IMPRESSION: Patient is a 78 y.o. male who was seen today for physical therapy evaluation and treatment for gait and calf atrophy. He reports no pain and states he would like to learn some exercises to work on his L calf. There is a circumferential difference from L to R but he was able to do 25 single leg calf raises on both sides. Patient scores high on BERG, and does not present as a fall  risk.  Due to absence of functional deficits skilled  physical therapy services are not warranted at this time. We agreed to a home exercise program that works on both calf strengthening and balance. Patient encouraged to call back with any specific changes or development of limitations during functional activities, as well as to continue follow-up with physician, as needed.?   OBJECTIVE IMPAIRMENTS: decreased strength.    REHAB POTENTIAL: Excellent  CLINICAL DECISION MAKING: Stable/uncomplicated  EVALUATION COMPLEXITY: Low   GOALS: Goals reviewed with patient? Yes  SHORT TERM GOALS: Target date: 08/21/24 Pt will verbalize understanding of role/purpose of physical therapy services and be able to independently identify if/when she may need to seek an add'l referral for services to assist w/ participation in functional activities Baseline: Goal status: MET    PLAN:  PT FREQUENCY: one time visit  PT DURATION: other: 1x visit  PLANNED INTERVENTIONS: 97110-Therapeutic exercises, 97530- Therapeutic activity, 97535- Self Care, Patient/Family education, and Balance training  PLAN FOR NEXT SESSION: Skilled PT services are not warranted at this time; pt encouraged to call back with any changes in functional status or limitations.    [place], PT 08/21/2024, 1:59 PM

## 2024-08-24 DIAGNOSIS — L237 Allergic contact dermatitis due to plants, except food: Secondary | ICD-10-CM | POA: Diagnosis not present

## 2024-08-24 DIAGNOSIS — Z08 Encounter for follow-up examination after completed treatment for malignant neoplasm: Secondary | ICD-10-CM | POA: Diagnosis not present

## 2024-08-24 DIAGNOSIS — L821 Other seborrheic keratosis: Secondary | ICD-10-CM | POA: Diagnosis not present

## 2024-08-24 DIAGNOSIS — D1801 Hemangioma of skin and subcutaneous tissue: Secondary | ICD-10-CM | POA: Diagnosis not present

## 2024-08-24 DIAGNOSIS — L720 Epidermal cyst: Secondary | ICD-10-CM | POA: Diagnosis not present

## 2024-08-24 DIAGNOSIS — Z85828 Personal history of other malignant neoplasm of skin: Secondary | ICD-10-CM | POA: Diagnosis not present

## 2024-08-24 DIAGNOSIS — L814 Other melanin hyperpigmentation: Secondary | ICD-10-CM | POA: Diagnosis not present

## 2024-08-24 DIAGNOSIS — L578 Other skin changes due to chronic exposure to nonionizing radiation: Secondary | ICD-10-CM | POA: Diagnosis not present

## 2024-08-24 DIAGNOSIS — L570 Actinic keratosis: Secondary | ICD-10-CM | POA: Diagnosis not present

## 2024-08-29 ENCOUNTER — Other Ambulatory Visit: Payer: Self-pay | Admitting: Cardiology

## 2024-08-29 ENCOUNTER — Other Ambulatory Visit: Payer: Self-pay | Admitting: Internal Medicine

## 2024-08-29 DIAGNOSIS — I2510 Atherosclerotic heart disease of native coronary artery without angina pectoris: Secondary | ICD-10-CM

## 2024-09-05 ENCOUNTER — Ambulatory Visit: Admitting: Internal Medicine

## 2024-09-28 DIAGNOSIS — H5213 Myopia, bilateral: Secondary | ICD-10-CM | POA: Diagnosis not present

## 2024-09-28 DIAGNOSIS — H2513 Age-related nuclear cataract, bilateral: Secondary | ICD-10-CM | POA: Diagnosis not present

## 2024-09-28 DIAGNOSIS — H524 Presbyopia: Secondary | ICD-10-CM | POA: Diagnosis not present

## 2024-09-28 DIAGNOSIS — H04123 Dry eye syndrome of bilateral lacrimal glands: Secondary | ICD-10-CM | POA: Diagnosis not present

## 2024-09-28 DIAGNOSIS — H353131 Nonexudative age-related macular degeneration, bilateral, early dry stage: Secondary | ICD-10-CM | POA: Diagnosis not present

## 2024-09-28 DIAGNOSIS — H25013 Cortical age-related cataract, bilateral: Secondary | ICD-10-CM | POA: Diagnosis not present

## 2024-09-28 DIAGNOSIS — H52203 Unspecified astigmatism, bilateral: Secondary | ICD-10-CM | POA: Diagnosis not present

## 2024-09-28 DIAGNOSIS — H43813 Vitreous degeneration, bilateral: Secondary | ICD-10-CM | POA: Diagnosis not present

## 2024-09-28 DIAGNOSIS — E119 Type 2 diabetes mellitus without complications: Secondary | ICD-10-CM | POA: Diagnosis not present

## 2024-09-28 LAB — HM DIABETES EYE EXAM

## 2024-09-29 ENCOUNTER — Encounter: Payer: Self-pay | Admitting: Internal Medicine

## 2024-10-10 ENCOUNTER — Ambulatory Visit (INDEPENDENT_AMBULATORY_CARE_PROVIDER_SITE_OTHER): Payer: Medicare Other

## 2024-10-10 VITALS — BP 120/60 | HR 89 | Temp 97.6°F | Ht 68.0 in | Wt 201.0 lb

## 2024-10-10 DIAGNOSIS — Z0000 Encounter for general adult medical examination without abnormal findings: Secondary | ICD-10-CM

## 2024-10-10 NOTE — Addendum Note (Signed)
 Addended by: TANDA ROJELIO ORN on: 10/10/2024 12:00 PM   Modules accepted: Orders

## 2024-10-10 NOTE — Progress Notes (Addendum)
 "  Chief Complaint  Patient presents with   Medicare Wellness     Subjective:   Harry Baker is a 78 y.o. male who presents for a Medicare Annual Wellness Visit.  Visit info / Clinical Intake: Medicare Wellness Visit Type:: Subsequent Annual Wellness Visit Persons participating in visit and providing information:: patient Medicare Wellness Visit Mode:: In-person (required for WTM) Interpreter Needed?: No Pre-visit prep was completed: yes AWV questionnaire completed by patient prior to visit?: yes Date:: 10/03/24 Living arrangements:: lives with spouse/significant other Patient's Overall Health Status Rating: very good Typical amount of pain: some Does pain affect daily life?: no Are you currently prescribed opioids?: no  Dietary Habits and Nutritional Risks How many meals a day?: 5 Eats fruit and vegetables daily?: yes Most meals are obtained by: preparing own meals Diabetic:: (!) yes Any non-healing wounds?: no How often do you check your BS?: as needed Would you like to be referred to a Nutritionist or for Diabetic Management? : no  Functional Status Activities of Daily Living (to include ambulation/medication): Independent Ambulation: Independent with device- listed below Home Assistive Devices/Equipment: Eyeglasses; Other (Comment) (Hearing Aids) Medication Administration: Independent Home Management (perform basic housework or laundry): Independent Manage your own finances?: yes Primary transportation is: driving Concerns about vision?: no *vision screening is required for WTM* Concerns about hearing?: (!) yes Uses hearing aids?: (!) yes Hear whispered voice?: (!) no *in-person visit only*  Fall Screening Falls in the past year?: 0 Number of falls in past year: 0 Was there an injury with Fall?: 0 Fall Risk Category Calculator: 0 Patient Fall Risk Level: Low Fall Risk  Fall Risk Patient at Risk for Falls Due to: No Fall Risks Fall risk Follow up: Falls  evaluation completed  Home and Transportation Safety: All rugs have non-skid backing?: (!) no All stairs or steps have railings?: yes Grab bars in the bathtub or shower?: yes Have non-skid surface in bathtub or shower?: yes Good home lighting?: yes Regular seat belt use?: yes Hospital stays in the last year:: no  Cognitive Assessment Difficulty concentrating, remembering, or making decisions? : no Will 6CIT or Mini Cog be Completed: yes What year is it?: 0 points What month is it?: 0 points Give patient an address phrase to remember (5 components): [address] [place]  About what time is it?: 0 points Count backwards from 20 to 1: 0 points Say the months of the year in reverse: 0 points Repeat the address phrase from earlier: 0 points 6 CIT Score: 0 points  Advance Directives (For Healthcare) Does Patient Have a Medical Advance Directive?: Yes Does patient want to make changes to medical advance directive?: No - Patient declined Type of Advance Directive: Healthcare Power of [place]; Living will Copy of Healthcare Power of Attorney in Chart?: Yes - validated most recent copy scanned in chart (See row information) Copy of Living Will in Chart?: Yes - validated most recent copy scanned in chart (See row information)  Reviewed/Updated  Reviewed/Updated: Reviewed All (Medical, Surgical, Family, Medications, Allergies, Care Teams, Patient Goals)    Allergies (verified) Patient has no known allergies.   Current Medications (verified) Outpatient Encounter Medications as of 10/10/2024  Medication Sig   aspirin EC 81 MG tablet Take 81 mg by mouth daily.    atorvastatin  (LIPITOR) 80 MG tablet TAKE 1 TABLET BY MOUTH EVERY DAY   Biotin 2500 MCG CAPS Take 2,500 mcg by mouth daily.    cholecalciferol (VITAMIN D3) 10 MCG/ML LIQD oral liquid Take  400 Units by mouth daily.   clobetasol  ointment (TEMOVATE ) 0.05 % Apply 1 Application topically as needed (poison ivy).   co-enzyme  Q-10 30 MG capsule Take 30 mg by mouth daily.   ezetimibe  (ZETIA ) 10 MG tablet TAKE 1 TABLET BY MOUTH EVERY DAY   fish oil-omega-3 fatty acids 1000 MG capsule Take 4 g by mouth daily.   fluticasone (FLONASE) 50 MCG/ACT nasal spray Place 1-2 sprays into both nostrils daily.   GLUCOSAMINE-CHONDROITIN PO Take 1 tablet by mouth daily.   metFORMIN  (GLUCOPHAGE ) 850 MG tablet TAKE 1 TABLET (850 MG TOTAL) BY MOUTH 3 (THREE) TIMES DAILY WITH MEALS.   Multiple Vitamin (MULTIVITAMIN) capsule Take 1 capsule by mouth daily.   Multiple Vitamins-Minerals (PRESERVISION AREDS 2+MULTI VIT PO)    nitroGLYCERIN  (NITROSTAT ) 0.4 MG SL tablet Place 1 tablet (0.4 mg total) under the tongue every 5 (five) minutes x 3 doses as needed for chest pain.   ramipril  (ALTACE ) 5 MG capsule TAKE 1 CAPSULE BY MOUTH EVERY DAY   zinc gluconate 50 MG tablet Take 50 mg by mouth daily.   No facility-administered encounter medications on file as of 10/10/2024.    History: Past Medical History:  Diagnosis Date   Arthritis    CAD (coronary artery disease)    s/p stent 6/08(-) myoview , 8/11 negative stress test   Diabetes mellitus    Diverticulosis of colon    Elevated PSA    ERECTILE DYSFUNCTION    GERD    Hemorrhoid    problems off and on   Herniated disc    sess chiropractor 2010   HOH (hard of hearing)    Hyperlipidemia    Prostate cancer (HCC)    Prostate cancer (HCC)    Vocal cord polyp    Past Surgical History:  Procedure Laterality Date   BIOPSY PROSTATE  02/15/2018   CARDIAC CATHETERIZATION  05/1999   stent   HEMORROIDECTOMY     MOUTH SURGERY  2010   TONSILLECTOMY     TRANSRECTAL ULTRASOUND  02/15/2018   VC cyst removal     Family History  Problem Relation Age of Onset   Heart failure Mother    Stroke Father    Prostate cancer Other        uncle   Lung cancer Brother    Colon cancer Other        great aunt   Heart attack Other        GF   Diabetes Sister    Esophageal cancer Neg Hx    Stomach  cancer Neg Hx    Social History   Occupational History   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[employer]  Tobacco Use   Smoking status: Light Smoker    Types: Cigars   Smokeless tobacco: Never   Tobacco comments:    occ cigar  Vaping Use   Vaping status: Never Used  Substance and Sexual Activity   Alcohol use: Yes    Comment: wine socially   Drug use: No   Sexual activity: Not on file   Tobacco Counseling Ready to quit: No Counseling given: Yes Tobacco comments: occ cigar  SDOH Screenings   Food Insecurity: No Food Insecurity (10/10/2024)  Housing: Low Risk (10/10/2024)  Transportation Needs: No Transportation Needs (10/10/2024)  Utilities: Not At Risk (10/10/2024)  Alcohol Screen: Low Risk (10/03/2024)  Depression (PHQ2-9): Low Risk (10/10/2024)  Financial Resource Strain: Low Risk (10/03/2024)  Physical Activity: Insufficiently Active (10/10/2024)  Social Connections: Socially Integrated (10/10/2024)  Stress: No Stress Concern Present (10/10/2024)  Tobacco Use: High Risk (10/10/2024)  Health Literacy: Adequate Health Literacy (10/10/2024)   See flowsheets for full screening details  Depression Screen PHQ 2 & 9 Depression Scale- Over the past 2 weeks, how often have you been bothered by any of the following problems? Little interest or pleasure in doing things: 0 Feeling down, depressed, or hopeless (PHQ Adolescent also includes...irritable): 0 PHQ-2 Total Score: 0 Trouble falling or staying asleep, or sleeping too much: 0 Feeling tired or having little energy: 0 Poor appetite or overeating (PHQ Adolescent also includes...weight loss): 0 Feeling bad about yourself - or that you are a failure or have let yourself or your family down: 0 Trouble concentrating on things, such as reading the newspaper or watching television (PHQ Adolescent also includes...like school work): 0 Moving or speaking so slowly that other people could have noticed. Or  the opposite - being so fidgety or restless that you have been moving around a lot more than usual: 0 Thoughts that you would be better off dead, or of hurting yourself in some way: 0 PHQ-9 Total Score: 0     Goals Addressed               This Visit's Progress     Remain acrive (pt-stated)        Continue to lose weight.             Objective:    Today's Vitals   10/10/24 1132  BP: 120/60  Pulse: 89  Temp: 97.6 F (36.4 C)  TempSrc: Oral  SpO2: 97%  Weight: 201 lb (91.2 kg)  Height: 5' 8 (1.727 m)   Body mass index is 30.56 kg/m.  Hearing/Vision screen Hearing Screening - Comments:: Wears Hearing Aids Vision Screening - Comments:: Wears rx glasses - up to date with routine eye exams with  Dr Janit Immunizations and Health Maintenance Health Maintenance  Topic Date Due   HEMOGLOBIN A1C  10/07/2024   COVID-19 Vaccine (8 - 2025-26 season) 01/15/2025   Diabetic kidney evaluation - eGFR measurement  04/07/2025   Diabetic kidney evaluation - Urine ACR  04/07/2025   FOOT EXAM  04/07/2025   OPHTHALMOLOGY EXAM  09/28/2025   Medicare Annual Wellness (AWV)  10/10/2025   Colonoscopy  05/11/2028   DTaP/Tdap/Td (4 - Td or Tdap) 04/30/2032   Pneumococcal Vaccine: 50+ Years  Completed   Influenza Vaccine  Completed   Hepatitis C Screening  Completed   Zoster Vaccines- Shingrix  Completed   Meningococcal B Vaccine  Aged Out   Hepatitis B Vaccines 19-59 Average Risk  Discontinued        Assessment/Plan:  This is a routine wellness examination for Cheney.  Patient Care Team: Amon Aloysius BRAVO, MD as PCP - General Pietro, Redell RAMAN, MD as PCP - Cardiology (Cardiology) Pietro Redell RAMAN, MD as Consulting Physician (Cardiology) [place] GAILS, DO as Consulting Physician (Gastroenterology) Alvaro Ricardo KATHEE Raddle., MD as Consulting Physician (Urology)  I have personally reviewed and noted the following in the patients chart:   Medical and social history Use of  alcohol, tobacco or illicit drugs  Current medications and supplements including opioid prescriptions. Functional ability and status Nutritional status Physical activity Advanced directives List of other physicians Hospitalizations, surgeries, and ER visits in previous 12 months Vitals Screenings to include cognitive, depression, and falls Referrals and appointments  No orders of the defined types were placed in this encounter.  In addition, I have reviewed and discussed with patient certain preventive protocols, quality metrics, and best practice recommendations. A written personalized care plan for preventive services as well as general preventive health recommendations were provided to patient.   Rojelio LELON Blush, LPN   [id number]   Return in 1 year (on 10/22/2025).  After Visit Summary: (In Person-Printed) AVS printed and given to the patient  Nurse Notes: No voiced or noted concerns at this time "

## 2024-10-10 NOTE — Patient Instructions (Addendum)
 Mr. Kustra,  Thank you for taking the time for your Medicare Wellness Visit. I appreciate your continued commitment to your health goals. Please review the care plan we discussed, and feel free to reach out if I can assist you further.  Please note that Annual Wellness Visits do not include a physical exam. Some assessments may be limited, especially if the visit was conducted virtually. If needed, we may recommend an in-person follow-up with your provider.  Ongoing Care Seeing your primary care provider every 3 to 6 months helps us  monitor your health and provide consistent, personalized care.   Referrals If a referral was made during today's visit and you haven't received any updates within two weeks, please contact the referred provider directly to check on the status.  Recommended Screenings:  Health Maintenance  Topic Date Due   Hemoglobin A1C  10/07/2024   COVID-19 Vaccine (8 - 2025-26 season) 01/15/2025   Yearly kidney function blood test for diabetes  04/07/2025   Yearly kidney health urinalysis for diabetes  04/07/2025   Complete foot exam   04/07/2025   Eye exam for diabetics  09/28/2025   Medicare Annual Wellness Visit  10/10/2025   Colon Cancer Screening  05/11/2028   DTaP/Tdap/Td vaccine (4 - Td or Tdap) 04/30/2032   Pneumococcal Vaccine for age over 20  Completed   Flu Shot  Completed   Hepatitis C Screening  Completed   Zoster (Shingles) Vaccine  Completed   Meningitis B Vaccine  Aged Out   Hepatitis B Vaccine  Discontinued       10/10/2024   11:41 AM  Advanced Directives  Does Patient Have a Medical Advance Directive? Yes  Type of Estate Agent of [place];Living will  Does patient want to make changes to medical advance directive? No - Patient declined  Copy of Healthcare Power of Attorney in Chart? Yes - validated most recent copy scanned in chart (See row information)    Vision: Annual vision screenings are recommended for early  detection of glaucoma, cataracts, and diabetic retinopathy. These exams can also reveal signs of chronic conditions such as diabetes and high blood pressure.  Dental: Annual dental screenings help detect early signs of oral cancer, gum disease, and other conditions linked to overall health, including heart disease and diabetes.  Please see the attached documents for additional preventive care recommendations.

## 2024-10-25 ENCOUNTER — Encounter: Payer: Self-pay | Admitting: Internal Medicine

## 2024-11-06 ENCOUNTER — Encounter: Payer: Self-pay | Admitting: Internal Medicine

## 2024-11-06 ENCOUNTER — Ambulatory Visit: Admitting: Internal Medicine

## 2024-11-06 VITALS — BP 126/72 | HR 65 | Temp 97.6°F | Resp 16 | Ht 68.0 in | Wt 197.1 lb

## 2024-11-06 DIAGNOSIS — Z7984 Long term (current) use of oral hypoglycemic drugs: Secondary | ICD-10-CM | POA: Diagnosis not present

## 2024-11-06 DIAGNOSIS — E7800 Pure hypercholesterolemia, unspecified: Secondary | ICD-10-CM

## 2024-11-06 DIAGNOSIS — E1159 Type 2 diabetes mellitus with other circulatory complications: Secondary | ICD-10-CM

## 2024-11-06 LAB — BASIC METABOLIC PANEL WITH GFR
BUN: 15 mg/dL (ref 6–23)
CO2: 30 meq/L (ref 19–32)
Calcium: 9.9 mg/dL (ref 8.4–10.5)
Chloride: 103 meq/L (ref 96–112)
Creatinine, Ser: 0.94 mg/dL (ref 0.40–1.50)
GFR: 78.02 mL/min
Glucose, Bld: 102 mg/dL — ABNORMAL HIGH (ref 70–99)
Potassium: 4.6 meq/L (ref 3.5–5.1)
Sodium: 139 meq/L (ref 135–145)

## 2024-11-06 LAB — MICROALBUMIN / CREATININE URINE RATIO
Creatinine,U: 168.6 mg/dL
Microalb Creat Ratio: 4.8 mg/g (ref 0.0–30.0)
Microalb, Ur: 0.8 mg/dL (ref 0.7–1.9)

## 2024-11-06 LAB — HEMOGLOBIN A1C: Hgb A1c MFr Bld: 6.6 % — ABNORMAL HIGH (ref 4.6–6.5)

## 2024-11-06 NOTE — Assessment & Plan Note (Signed)
 DM with CAD Managed with metformin .  Check A1c Hyperlipidemia Managed with atorvastatin .  Controlled History of prostate cancer: Asymptomatic, saw urology recently HTN Blood pressure well-controlled on ramipril .  Check BMP. Had a flu and a COVID-vaccine Follow-up 6 months

## 2024-11-06 NOTE — Patient Instructions (Signed)
 Please read your instructions carefully.   GO TO THE LAB :  Get the blood work    Go to the front desk for the checkout Please make an appointment for a checkup in 6 months

## 2024-11-06 NOTE — Progress Notes (Signed)
 "  Subjective:    Patient ID: Harry Baker, male    DOB: Nov 03, 1945, 79 y.o.   MRN: [id number]  DOS:  11/06/2024 Follow-up Discussed the use of AI scribe software for clinical note transcription with the patient, who gave verbal consent to proceed.  History of Present Illness Harry Baker is a 79 year old male who presents for a routine follow-up.  Sensory impairment - Significant hearing  impairment - Relies on lip-reading during conversations  Cardiovascular risk management - Takes aspirin, atorvastatin , and ramipril  - Does not monitor blood pressure at home  Diabetes management - Takes metformin  - Does not monitor blood glucose at home  Immunization status - Received influenza and COVID vaccines on the same day  Genitourinary symptoms - No urinary symptoms - Recent urology evaluation was unremarkable - Urine sample results pending  General review of systems - No chest pain - No shortness of breath    Review of Systems See above   Past Medical History:  Diagnosis Date   Arthritis    CAD (coronary artery disease)    s/p stent 6/08(-) myoview , 8/11 negative stress test   Diabetes mellitus    Diverticulosis of colon    Elevated PSA    ERECTILE DYSFUNCTION    GERD    Hemorrhoid    problems off and on   Herniated disc    sess chiropractor 2010   HOH (hard of hearing)    Hyperlipidemia    Prostate cancer (HCC)    Prostate cancer (HCC)    Vocal cord polyp     Past Surgical History:  Procedure Laterality Date   BIOPSY PROSTATE  02/15/2018   CARDIAC CATHETERIZATION  05/1999   stent   HEMORROIDECTOMY     MOUTH SURGERY  2010   TONSILLECTOMY     TRANSRECTAL ULTRASOUND  02/15/2018   VC cyst removal      Current Outpatient Medications  Medication Instructions   aspirin EC 81 mg, Daily   atorvastatin  (LIPITOR) 80 mg, Oral, Daily   Biotin 2,500 mcg, Daily   cholecalciferol (VITAMIN D3) 400 Units, Daily   clobetasol  ointment (TEMOVATE ) 0.05 %  1 Application, As needed   co-enzyme Q-10 30 mg, Daily   ezetimibe  (ZETIA ) 10 mg, Oral, Daily   fish oil-omega-3 fatty acids 4 g, Daily   fluticasone (FLONASE) 50 MCG/ACT nasal spray 1-2 sprays, Daily   GLUCOSAMINE-CHONDROITIN PO 1 tablet, Daily   metFORMIN  (GLUCOPHAGE ) 850 mg, Oral, 3 times daily with meals   Multiple Vitamin (MULTIVITAMIN) capsule 1 capsule, Daily   Multiple Vitamins-Minerals (PRESERVISION AREDS 2+MULTI VIT PO)    nitroGLYCERIN  (NITROSTAT ) 0.4 mg, Sublingual, Every 5 min x3 PRN   ramipril  (ALTACE ) 5 MG capsule Oral, Daily   zinc gluconate 50 mg, Daily       Objective:   Physical Exam BP 126/72   Pulse 65   Temp 97.6 F (36.4 C) (Oral)   Resp 16   Ht 5' 8 (1.727 m)   Wt 197 lb 2 oz (89.4 kg)   SpO2 96%   BMI 29.97 kg/m  General:   Well developed, NAD, BMI noted. HEENT:  Normocephalic . Face symmetric, atraumatic Lungs:  CTA B Normal respiratory effort, no intercostal retractions, no accessory muscle use. Heart: RRR,  no murmur.  Lower extremities: no pretibial edema bilaterally  Skin: Not pale. Not jaundice Neurologic:  alert & oriented X3.  Speech normal, gait appropriate for age and unassisted Psych--  Cognition and judgment appear intact.  Cooperative with normal attention span and concentration.  Behavior appropriate. No anxious or depressed appearing.      Assessment   Assessment DM HTN Hyperlipidemia CAD, stent 2008, negative stress test 2011 Prostate Cancer 01/2018, s/p XRT GERD, Nl EGD 2004 (done for hoarseness) ED HOH   Assessment & Plan DM with CAD Managed with metformin .  Check A1c Hyperlipidemia Managed with atorvastatin .  Controlled History of prostate cancer: Asymptomatic, saw urology recently HTN Blood pressure well-controlled on ramipril .  Check BMP. Had a flu and a COVID-vaccine Follow-up 6 months     "

## 2024-11-08 ENCOUNTER — Ambulatory Visit: Payer: Self-pay | Admitting: Internal Medicine

## 2025-05-07 ENCOUNTER — Ambulatory Visit: Admitting: Internal Medicine

## 2025-05-21 ENCOUNTER — Ambulatory Visit: Admitting: Internal Medicine

## 2025-10-16 ENCOUNTER — Ambulatory Visit

## 2025-10-22 ENCOUNTER — Ambulatory Visit
